# Patient Record
Sex: Male | Born: 1935
Health system: Southern US, Community
[De-identification: ages and names within clinical notes are randomized; demographics above are authoritative.]

## PROBLEM LIST (undated history)

## (undated) DIAGNOSIS — I639 Cerebral infarction, unspecified: Secondary | ICD-10-CM

## (undated) DIAGNOSIS — I714 Abdominal aortic aneurysm, without rupture, unspecified: Secondary | ICD-10-CM

## (undated) DIAGNOSIS — E119 Type 2 diabetes mellitus without complications: Secondary | ICD-10-CM

## (undated) DIAGNOSIS — K449 Diaphragmatic hernia without obstruction or gangrene: Secondary | ICD-10-CM

## (undated) DIAGNOSIS — E785 Hyperlipidemia, unspecified: Secondary | ICD-10-CM

## (undated) DIAGNOSIS — I728 Aneurysm of other specified arteries: Secondary | ICD-10-CM

## (undated) DIAGNOSIS — H9190 Unspecified hearing loss, unspecified ear: Secondary | ICD-10-CM

## (undated) DIAGNOSIS — I10 Essential (primary) hypertension: Secondary | ICD-10-CM

## (undated) DIAGNOSIS — I351 Nonrheumatic aortic (valve) insufficiency: Secondary | ICD-10-CM

## (undated) HISTORY — DX: Hyperlipidemia, unspecified: E78.5

## (undated) HISTORY — DX: Abdominal aortic aneurysm, without rupture: I71.4

## (undated) HISTORY — PX: EYE SURGERY: SHX253

## (undated) HISTORY — DX: Diaphragmatic hernia without obstruction or gangrene: K44.9

## (undated) HISTORY — PX: HERNIA REPAIR: SHX51

## (undated) HISTORY — PX: CHOLECYSTECTOMY: SHX55

## (undated) HISTORY — DX: Nonrheumatic aortic (valve) insufficiency: I35.1

## (undated) HISTORY — DX: Type 2 diabetes mellitus without complications: E11.9

## (undated) HISTORY — DX: Aneurysm of other specified arteries: I72.8

## (undated) HISTORY — DX: Abdominal aortic aneurysm, without rupture, unspecified: I71.40

---

## 2003-09-12 ENCOUNTER — Encounter: Admission: RE | Admit: 2003-09-12 | Discharge: 2003-09-12 | Payer: Self-pay | Admitting: Family Medicine

## 2003-10-11 ENCOUNTER — Encounter: Admission: RE | Admit: 2003-10-11 | Discharge: 2003-10-11 | Payer: Self-pay | Admitting: Vascular Surgery

## 2003-10-11 ENCOUNTER — Encounter: Admission: RE | Admit: 2003-10-11 | Discharge: 2004-01-09 | Payer: Self-pay | Admitting: Family Medicine

## 2003-10-30 HISTORY — PX: ABDOMINAL AORTIC ANEURYSM REPAIR: SUR1152

## 2003-10-31 ENCOUNTER — Inpatient Hospital Stay (HOSPITAL_COMMUNITY): Admission: RE | Admit: 2003-10-31 | Discharge: 2003-11-07 | Payer: Self-pay | Admitting: Vascular Surgery

## 2003-12-02 ENCOUNTER — Encounter: Admission: RE | Admit: 2003-12-02 | Discharge: 2003-12-02 | Payer: Self-pay | Admitting: Vascular Surgery

## 2004-01-06 ENCOUNTER — Encounter: Admission: RE | Admit: 2004-01-06 | Discharge: 2004-01-06 | Payer: Self-pay | Admitting: Vascular Surgery

## 2005-01-04 ENCOUNTER — Encounter: Admission: RE | Admit: 2005-01-04 | Discharge: 2005-01-04 | Payer: Self-pay | Admitting: Vascular Surgery

## 2005-04-09 ENCOUNTER — Encounter (INDEPENDENT_AMBULATORY_CARE_PROVIDER_SITE_OTHER): Payer: Self-pay | Admitting: *Deleted

## 2005-04-09 ENCOUNTER — Ambulatory Visit (HOSPITAL_COMMUNITY): Admission: RE | Admit: 2005-04-09 | Discharge: 2005-04-10 | Payer: Self-pay | Admitting: General Surgery

## 2006-01-10 ENCOUNTER — Encounter: Admission: RE | Admit: 2006-01-10 | Discharge: 2006-01-10 | Payer: Self-pay | Admitting: Vascular Surgery

## 2007-01-09 ENCOUNTER — Encounter: Admission: RE | Admit: 2007-01-09 | Discharge: 2007-01-09 | Payer: Self-pay | Admitting: Vascular Surgery

## 2007-01-09 ENCOUNTER — Ambulatory Visit: Payer: Self-pay | Admitting: Vascular Surgery

## 2008-01-06 ENCOUNTER — Ambulatory Visit: Payer: Self-pay | Admitting: Vascular Surgery

## 2009-01-11 ENCOUNTER — Ambulatory Visit: Payer: Self-pay | Admitting: Vascular Surgery

## 2009-01-11 ENCOUNTER — Encounter: Admission: RE | Admit: 2009-01-11 | Discharge: 2009-01-11 | Payer: Self-pay | Admitting: Vascular Surgery

## 2009-01-12 ENCOUNTER — Ambulatory Visit: Payer: Self-pay | Admitting: Oncology

## 2009-02-03 LAB — CBC WITH DIFFERENTIAL/PLATELET
BASO%: 0.3 % (ref 0.0–2.0)
Basophils Absolute: 0 10*3/uL (ref 0.0–0.1)
EOS%: 2.4 % (ref 0.0–7.0)
Eosinophils Absolute: 0.1 10*3/uL (ref 0.0–0.5)
HCT: 44 % (ref 38.4–49.9)
HGB: 15.2 g/dL (ref 13.0–17.1)
LYMPH%: 18 % (ref 14.0–49.0)
MCH: 33.2 pg (ref 27.2–33.4)
MCHC: 34.6 g/dL (ref 32.0–36.0)
MCV: 96.1 fL (ref 79.3–98.0)
MONO#: 0.5 10*3/uL (ref 0.1–0.9)
MONO%: 10.3 % (ref 0.0–14.0)
NEUT#: 3.7 10*3/uL (ref 1.5–6.5)
NEUT%: 69 % (ref 39.0–75.0)
Platelets: 134 10*3/uL — ABNORMAL LOW (ref 140–400)
RBC: 4.58 10*6/uL (ref 4.20–5.82)
RDW: 13.8 % (ref 11.0–14.6)
WBC: 5.3 10*3/uL (ref 4.0–10.3)
lymph#: 1 10*3/uL (ref 0.9–3.3)

## 2009-02-03 LAB — COMPREHENSIVE METABOLIC PANEL
ALT: 20 U/L (ref 0–53)
AST: 21 U/L (ref 0–37)
Albumin: 4 g/dL (ref 3.5–5.2)
Alkaline Phosphatase: 90 U/L (ref 39–117)
BUN: 22 mg/dL (ref 6–23)
CO2: 24 mEq/L (ref 19–32)
Calcium: 8.7 mg/dL (ref 8.4–10.5)
Chloride: 107 mEq/L (ref 96–112)
Creatinine, Ser: 0.93 mg/dL (ref 0.40–1.50)
Glucose, Bld: 190 mg/dL — ABNORMAL HIGH (ref 70–99)
Potassium: 4.5 mEq/L (ref 3.5–5.3)
Sodium: 139 mEq/L (ref 135–145)
Total Bilirubin: 0.6 mg/dL (ref 0.3–1.2)
Total Protein: 6.3 g/dL (ref 6.0–8.3)

## 2009-02-03 LAB — LACTATE DEHYDROGENASE: LDH: 206 U/L (ref 94–250)

## 2009-02-10 ENCOUNTER — Ambulatory Visit (HOSPITAL_COMMUNITY): Admission: RE | Admit: 2009-02-10 | Discharge: 2009-02-10 | Payer: Self-pay | Admitting: Oncology

## 2009-02-15 ENCOUNTER — Ambulatory Visit: Payer: Self-pay | Admitting: Oncology

## 2010-01-26 ENCOUNTER — Ambulatory Visit: Payer: Self-pay | Admitting: Oncology

## 2010-01-30 ENCOUNTER — Ambulatory Visit (HOSPITAL_COMMUNITY): Admission: RE | Admit: 2010-01-30 | Discharge: 2010-01-30 | Payer: Self-pay | Admitting: Oncology

## 2010-07-21 ENCOUNTER — Other Ambulatory Visit: Payer: Self-pay | Admitting: Oncology

## 2010-07-21 DIAGNOSIS — C8590 Non-Hodgkin lymphoma, unspecified, unspecified site: Secondary | ICD-10-CM

## 2010-07-22 ENCOUNTER — Encounter: Payer: Self-pay | Admitting: Oncology

## 2010-07-22 ENCOUNTER — Encounter: Payer: Self-pay | Admitting: Vascular Surgery

## 2010-10-07 LAB — GLUCOSE, CAPILLARY: Glucose-Capillary: 116 mg/dL — ABNORMAL HIGH (ref 70–99)

## 2010-11-13 NOTE — Assessment & Plan Note (Signed)
OFFICE VISIT   [name], [name]  DOB:  11-Sep-1932                                       01/06/2008  [id number]   The patient returns for followup today.  He had an abdominal aortic  aneurysm and right common iliac artery aneurysm repaired in May of 2005.  He has done well since then.  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   He has had no significant changes in his medical problems in the last  year.  He has had no abdominal or back pain.  He has had some pain in  his left anterior tibia and left hip on occasion.  He states that  usually this occurs at night time as far as the tibia is concerned and  it is improved by walking around some.  The left hip pain is  intermittent to only when he is on his feet.  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  Of note, the celiac  aneurysm was 1.5 cm in diameter in 2008.   PHYSICAL EXAMINATION:  Vital signs:  On exam today the blood pressure is  116/70 in the left arm, pulse is 70 and regular.  Neck:  Has no carotid  bruit.  Chest:  Clear to auscultation.  Cardiac:  Regular rate and  rhythm.  Extremities:  He has 2+ radial, femoral, popliteal, dorsalis  pedis and posterior tibial pulses bilaterally.  The left popliteal pulse  is slightly fuller than the right.  Of note, he did have a popliteal  ultrasound in 2005 which showed no aneurysm.  Abdomen:  Exam shows no  evidence of hernia, soft, nontender, nondistended with no mass.   MEDICATIONS:  1. Currently include Vytorin.  2. Nexium.  3. Aspirin.  4. Actos.  5. Claritin.   Overall the patient continues to do well.  His aneurysm repair seems  intact and he has adequate lower extremity arterial circulation which is  normal by pulse exam.  We will obtain a followup CT angio of the abdomen  and pelvis in 1 year.  If the celiac aneurysm is stable in size we may  switch that to every few years  subsequently.   [name] Oto. Fields, MD  Electronically Signed   CEF/MEDQ  D:  01/06/2008  T:  01/07/2008  Job:  [id number]   cc:   [name] [name]. [name] [name], M.D.

## 2010-11-13 NOTE — Assessment & Plan Note (Signed)
OFFICE VISIT   WRIGHT, MOLLE  DOB:  April 10, 1933                                       01/11/2009  [id number]   The patient is a 77 year old male who returns for followup today.  He  previously had repair of an infrarenal abdominal aortic aneurysm and  iliac aneurysm in May 2005.  He has done well since then.  He has had a  small celiac artery aneurysm that we have been following for him.  He  continues to deny any abdominal or back pain.  He has  Some occasional left hip pain.  He has had no recent weight loss or  gain.  Overall he is doing well.  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  He is also continuing  his hobby of restoring old cars.   CURRENT MEDICATIONS:  1. Crestor 10 mg once a day.  2. Actos 30 mg once a day.  3. Lexapro 5 mg once a day.  4. Nexium 40 mg once a day.  5. Nasonex p.r.n.  6. Aspirin 81 mg once a day.  7. Multivitamin once a day.  8. Fish oil.  9. Claritin p.r.n.   ALLERGIES:  He is allergic to shellfish and iodine but has not had  difficulties with contrast-enhanced CT scans.   PHYSICAL EXAMINATION:  Today, blood pressure 147/75 in the right arm,  pulse 75 and regular.  HEENT:  Unremarkable.  Neck:  Has 2+ carotid  pulses without bruit.  Chest:  Clear to auscultation.  Cardiac:  Regular  rate and rhythm without murmur.  Abdomen:  Soft, nontender,  nondistended.  No masses, with a well-healed midline laparotomy scar.  He has 2+ femoral and dorsalis pedis pulses bilaterally.  He has no  significant pretibial edema.   I reviewed his CT scan of the abdomen and pelvis from today which shows  the celiac artery aneurysm near the origin which is 1.6 cm in diameter  and is essentially unchanged.  The celiac artery aneurysm has now been  stable in size for 5 years.  Of note, in 2005 at the time of his initial  CT scan he was noted to have several pleural nodules.  These  were  followed initially with serial CT scans with no change.  The subpleural  nodule on the left side has remained stable on all subsequent CT scans.  However, he was also noted in 2005 to have prominent mesenteric lymph  nodes.  These have slowly progressively enlarged over the last few  years.  On today's CT scan, the celiac artery aneurysm again remains  stable in size at 1.6 cm.  However, the size and number of mesenteric  lymph nodes have increased.  There is also associated calcification.   In summary, as far as the patient's celiac aneurysm is concerned, this  has been stable now for 5 years and I believe that we can probably space  his CT scans out.  I have advised him that we should get another CT scan  in about 5 years.  As far as the mesenteric lymph nodes are concerned,  they have been slowly growing over time.  I believe the best option  would be to get a hematology opinion on whether or not I think these  lymph  nodes are clinically significant.  The patient denies any  abdominal pain and does not have any constitutional-type symptoms to  suggest lymphoma or carcinoid.  However, I will defer whether he had any  further workup might be necessary to the hematologist.  We will schedule  this appointment for him in the near future.  Otherwise he will  recontact [place] in a few years to check his celiac artery again.   Jessy Oto. Fields, MD  Electronically Signed   CEF/MEDQ  D:  01/11/2009  T:  01/12/2009  Job:  [id number]   cc:   Modena Jansky. Marisue Humble, M.D.

## 2010-11-13 NOTE — Assessment & Plan Note (Signed)
OFFICE VISIT   [name] [name], [name] [name]  DOB:  [birth year]                                       01/09/2007  [id number]   The patient returns for followup today after repair of his abdominal  aortic aneurysm in May 2005.  He has had no problems since then.  We  were also following him for a small celiac artery aneurysm.  He denies  any abdominal or back pain.  States he is doing well overall and has no  complaints.   PHYSICAL EXAMINATION:  Blood pressure is 120/67, pulse is 74 and  regular.  Abdomen is soft, nontender with no pulsatile masses.  He has  2+ femoral pulses bilaterally.  He has 2+ dorsalis pedis and posterior  tibial pulses bilaterally.   His abdominal incision is well-healed with no hernia.  He had a CT angio  of the abdomen and pelvis today.  This shows that the celiac artery  aneurysm is still 74mm in diameter and unchanged over the last few  years.  He did have some mild narrowing of the right iliac anastomosis  which does not appear to be flow limiting at least based on clinical  exam.  He denies any claudication symptoms.   Overall I believe the patient is doing well.  We will see him back in  followup in 1 year's time to make sure that he has had no symptoms from  the iliac narrowing.  We will repeat his CT scan of the abdomen and  pelvis in 2 year's time.   Jessy Oto. Fields, MD  Electronically Signed   CEF/MEDQ  D:  01/09/2007  T:  01/12/2007  Job:  [id number]   cc:   Modena Jansky. Marisue Humble, M.D.

## 2010-11-16 NOTE — Op Note (Signed)
NAME:  [name] [name], [name] [name] NO.:  [id number]   MEDICAL RECORD NO.:  [id number]                   PATIENT TYPE:  INP   LOCATION:  2874                                 FACILITY:  [facility]   PHYSICIAN:  Jessy Oto. Fields, MD               DATE OF BIRTH:  09/28/1932   DATE OF PROCEDURE:  10/31/2003  DATE OF DISCHARGE:                                 OPERATIVE REPORT   PREOPERATIVE DIAGNOSIS:  Infrarenal abdominal aortic and right common iliac  artery aneurysm.   POSTOPERATIVE DIAGNOSIS:  Infrarenal abdominal aortic and right common iliac  artery aneurysm.   PROCEDURE:  Aortobi-iliac grafting with repair of abdominal aortic aneurysm  and right common iliac artery aneurysm.   ANESTHESIA:  General.   ASSISTANTS:  Judeth Cornfield. Scot Dock, M.D., Suzzanne Cloud, P.A.   INDICATIONS:  The patient is a 77 year old male with history of a 3.7 cm  right common iliac artery aneurysm.  He also has a 3 cm infrarenal abdominal  aortic aneurysm.  He also has strong family history of abdominal aortic  aneurysm.   FINDINGS:  1. A 3.7 cm right common iliac artery aneurysm.  2. A 16 x 8 bifurcated Dacron graft.  3. Aortobi-iliac anastomoses to the iliac bifurcation bilaterally.   OPERATIVE DETAILS:  After obtaining informed consent, the patient was taken  to the operating room.  The patient was placed in the supine position on the  operating table.  After induction of general anesthesia and endotracheal  intubation, a nasogastric tube and a Foley catheter were placed.  Next the  patient's abdomen was prepped and draped in the usual sterile fashion from  the nipples down to the knees.  A midline laparotomy incision was made in  the midline, carried down through the subcutaneous tissues through the  fascia, and the peritoneum was entered.  The incision extended from the  xiphoid down to the level of the pubis.  Next the abdomen was explored.  The  gallbladder was shrunken but  normal in appearance.  The liver, colon, small  bowel, and liver were all grossly normal in appearance.   Next the transverse colon and omentum were reflected superiorly and the  small bowel reflected laterally.  Retroperitoneum was then opened  along the  course of the aorta and along the aortic bifurcation.  A 3.7 cm right common  iliac artery aneurysm was identified.  The dissection was carried down on  the right side down to the level of the iliac bifurcation.  The internal  iliac and external iliac arteries were then controlled circumferentially  with vessel loops.  Next the dissection proceeded superiorly up to the level  of the renal vein.  The renal vein was dissected on its posterior aspect and  the aorta dissected free circumferentially and controlled with an umbilical  tape.  Next the dissection proceeded down to the level of the inferior  mesenteric artery.  This was dissected  free circumferentially and controlled  with a vessel loop.  Dissection then proceeded down on the left iliac  system, and this dissection was also carried down to the left external and  internal iliac arteries, which were dissected free circumferentially and  controlled with vessel loops.  Next the patient was given 5000 units of  intravenous heparin and 12.5 g of mannitol.  The aorta was then clamped  proximally.  Initially the clamp was abutting across a portion of the right  renal artery, and this was moved down slightly so that the clamp was just  below the level of the renal arteries on both sides.  Next the aorta was  opened in the midline and several pairs of lumbar arteries were controlled  with 2-0 silk sutures.  Two lumbars near the superiormost portion of the  dissection were controlled with clips posteriorly.  Next a 16 x 8 mm Dacron  graft was brought up in the operative field.  A portion of the top end was  cut off and then a proximal anastomosis created using a running 3-0 Prolene,   incorporating a felt circumferentially in the proximal anastomosis.  The  anastomosis was secured proximally and the clamp was released.  The  anastomosis was tested and found to be hemostatic.  Next the graft was  pulled down to length and spatulated on the right iliac side.  The artery  wall was of good quality and debrided down through the iliac aneurysm down  to the level of the iliac bifurcation.  This was then sewn as an end-to-end  anastomosis using a running 5-0 Prolene suture.  There was a small amount of  bleeding on the posterior wall prior to releasing the clamps, and this was  controlled with one 3-0 Prolene stitch.  Prior to completion of the  anastomosis, the external iliac and internal iliac arteries were backbled  and the graft forebled.  The anastomosis was then secured and flow was  restored to the right leg.  There was a mild drop in pressure upon  restoration of flow to the right leg.  There was a palpable femoral pulse  upon completion of this anastomosis.  Next attention turned to the left  side.  Since the patient was fairly young in age, the graft was tunneled  underneath the nerve plexus crossing the aortic bifurcation.  This was then  pulled down to the level of the iliac bifurcation on the left side.  The  iliac artery was transected in this location and an end-to-end anastomosis  created using a running 5-0 Prolene suture.  Just prior to completion of the  anastomosis, the external and internal iliac arteries were backbled, the  graft was thoroughly flushed forward.  The anastomosis was then secured and  flow was restored to the left leg.  There was also a mild pressure drop on  his leg as well.  There was a palpable femoral pulse after this anastomosis  was completed.  Next the inferior mesenteric artery was inspected and found  to have brisk backbleeding.  This was ligated with a silk suture.  Next the retroperitoneum was inspected.  Hemostasis was obtained.   Protamine 50 mg  was given.  The retroperitoneum was then closed with a running 3-0 Prolene  suture.  There was minimal aortic tissue to cover the graft, so large bites  of retroperitoneal fat were used to cover this.  The right common iliac  artery aneurysm was closed over the graft.  The abdomen was then closed with  a #1 PDS in the fascial layer, the wound thoroughly irrigated with normal  saline solution, and then the skin closed with staples.  The patient  tolerated the procedure well, and there were no complications.  Instrument,  sponge, and needle count was correct at the end of the case.  The patient  had palpable dorsalis pedis pulses bilaterally at the end of the case.  The  sigmoid colon was inspected at the end of the case, found to be pink in  character, as well as the small bowel.  The patient was extubated in the  operating room and taken to the recovery room in stable condition.                                               Jessy Oto. Fields, MD    CEF/MEDQ  D:  10/31/2003  T:  10/31/2003  Job:  [id number]

## 2010-11-16 NOTE — H&P (Signed)
NAME:  [name] [name], [name] [name]                    ACCOUNT NO.:  [id number]   MEDICAL RECORD NO.:  [id number]                   PATIENT TYPE:  INP   LOCATION:                                       FACILITY:  [facility]   PHYSICIAN:  Jessy Oto. Fields, MD               DATE OF BIRTH:  03/02/1933   DATE OF ADMISSION:  10/31/2003  DATE OF DISCHARGE:                                HISTORY & PHYSICAL   CHIEF COMPLAINT:  Right iliac aneurysm.   HISTORY OF PRESENT ILLNESS:  [name] is a 77 year old male who was  referred by Dr. Marisue Humble for evaluation of a right iliac aneurysm.  The  patient has a brother who had an abdominal aortic aneurysm repair, and he  felt that he should undergo screening for this, since it may be hereditary.  He then underwent a screening abdominal ultrasound, which did indeed show a  small abdominal aortic aneurysm, and a rather large right iliac aneurysm.  This study was followed up with a CT scan which showed a 37 mm right common  iliac aneurysm.  The aortic diameter was 3 cm.  The patient was then seen in  consultation by Dr. Oneida Alar on October 14, 2003.  Dr. Oneida Alar discussed surgical  repair of this aneurysm.  He suggested an aortobiiliac bypass grafting in  order to treat the small aortic aneurysm, as well as the iliac artery  aneurysm.  The risks, benefits, and alternatives to the procedure were  discussed with the patient and his wife at the time, and they were in  understanding and wished to proceed with surgery.   Currently, the patient is completely asymptomatic from his aortic aneurysm  disease.  He denies any abdominal pain, nausea, vomiting, constipation,  hematochezia, back pain, hematemesis, claudication symptoms, peripheral  edema, dysuria, hematuria, or GERD-like symptoms.  He denies any shortness  of breath or dyspnea on exertion.  The patient denies any fevers, chills,  night sweats, or cough.   PAST MEDICAL HISTORY:  1. Diabetes mellitus type 2  diabetes, which is diet controlled, and     diagnosed approximately a year ago.  2. Hypercholesterolemia.  3. Hiatal hernia.   PAST SURGICAL HISTORY:  Inguinal hernia repair x2.   MEDICATIONS:  1. Lipitor 5 mg p.o. daily.  2. Nexium 40 mg p.o. daily.  3. Nasonex 50 mcg to each nostril twice daily as needed.  4. Zyrtec 10 mg p.o. daily.  5. Aspirin 81 mg p.o. daily.  6. Vitamin E 400 international units p.o. daily.  7. Multivitamin daily.   ALLERGIES:  The patient has a severe allergy to shellfish.  He has seasonal  allergies and hay fever.   REVIEW OF SYSTEMS:  Please see HPI for pertinent positives and negatives.  Otherwise, the patient denies any kidney disease, TIA or CVA, amaurosis  fugax, cardiac disease, or pulmonary problems.  He denies any each bruising  or bleeding.  The  patient does wear glasses.  The remaining 12-point review  of systems is negative.   FAMILY HISTORY:  The patient's mother had severe rheumatoid arthritis and  passed away in her 38's.  His father died at age 77 of unknown etiology,  although he was reportedly a heavy drinker.  He has 4 brothers who all have  hypertension and diabetes mellitus.  He has 1 brother who has undergone  abdominal aortic aneurysm repair and grafting.   SOCIAL HISTORY:  Mr. [name] [name] has been married for 57 years, and has 2  children.  He is currently retired from Furniture conservator/restorer work.  He denies any  alcohol or tobacco use.   PHYSICAL EXAMINATION:  VITAL SIGNS:  His blood pressure on the right is  118/64.  His blood pressure on the left is 118/70.  Pulse is 74 and regular.  Respirations are 14 and unlabored.  GENERAL:  This is a pleasant 77 year old white male who in no acute distress  and is alert and oriented x3.  HEENT:  Normocephalic and atraumatic.  Pupils equal, round and reactive to  light and accommodation.  Extraocular movements intact.  The patient is  wearing glasses, and has all of his native teeth.  His oropharynx  is pink  and moist without exudate or lesion.  NECK:  Supple without lymphadenopathy, JVD, or bruit.  LUNGS:  Clear to auscultation throughout without wheezes, rhonchi, rales, or  lymphadenopathy.  CARDIAC:  Regular rate and rhythm without murmur, gallop, or rub.  ABDOMEN:  Soft, nontender, nondistended, with bowel sounds in all 4  quadrants.  No organomegaly or masses or bruit.  GU/RECTAL:  Deferred.  EXTREMITIES:  Warm and dry without edema, clubbing, or cyanosis.  Peripheral  pulses are as follows:  2+ carotid pulses bilaterally, 2+ femoral pulses  bilaterally, 2+ dorsalis pedis pulses bilaterally, 2+ posterior tibial  pulses bilaterally.  NEUROLOGIC:  Nonfocal.  Sensation is intact.  Gait is steady.  DTR's are 2+  and equal.  Muscle strength is equal and appropriate throughout.   ASSESSMENT AND PLAN:  Mr. [name] is a 77 year old male with  asymptomatic right iliac aneurysm and small abdominal aortic aneurysm.  We  will continue as planned with aortobiiliac bypass grafting on Oct 31, 2003,  to be completed by Dr. Oneida Alar of [facility].  Dr. Oneida Alar has been and evaluated  this patient prior to this admission, and has explained the risks and  benefits of the procedure.  The patient is in understands and agrees to  proceed with surgery.      Carolyn A. Zigmund Gottron.                  Jessy Oto. Fields, MD    CAF/MEDQ  D:  10/27/2003  T:  10/27/2003  Job:  [id number]   cc:   Modena Jansky. Marisue Humble, M.D.  [address] [place]  [place] [address]  Fax: [phone]

## 2010-11-16 NOTE — Op Note (Signed)
[name], [name]          ACCOUNT NO.:  [id number]   MEDICAL RECORD NO.:  [id number]          PATIENT TYPE:  OIB   LOCATION:  5707                         FACILITY:  [facility]   PHYSICIAN:  Shellia Carwin, M.D. DATE OF BIRTH:  1932/12/01   DATE OF PROCEDURE:  04/09/2005  DATE OF DISCHARGE:                                 OPERATIVE REPORT   OPERATIVE PROCEDURE:  Laparoscopic cholecystectomy with intraoperative  cholangiogram.   SURGEON:  Shellia Carwin, M.D.   ASSISTANT:  Fenton Malling. Lucia Gaskins, M.D.   ANESTHESIA:  General endotracheal.   PREOPERATIVE DIAGNOSES:  Gallstone.   POSTOP DIAGNOSIS:  Gallstone; plus normal cholangiogram.   CLINICAL SUMMARY:  A 77 year old male with gallstones. They were noted about  2 years ago, but not really bothering him.  Recently they have been giving  him some problems with indigestion and pain, and he comes in for elective  surgery. Of note is the fact that approximately 1 year ago he had AAA  surgery.   OPERATIVE FINDINGS:  We were fortunate in that he did not have many  adhesions at all, and we could perform the procedure laparoscopically.  The  gallbladder was thin-walled and the cholangiogram looked normal. The artery  and duct looked normal.   OPERATIVE PROCEDURE:  Under satisfactory general endotracheal anesthesia,  having received 1.0 grams Ancef preoperatively, the patient's abdomen was  prepped and draped in the standard fashion. Approximately 25 cc of 0.5%  Marcaine with epinephrine was infiltrated at the skin incision sites for  postoperative analgesia. A transverse incision made above the umbilicus and  the midline opened into the peritoneal cavity. It was done very carefully in  case bowel was stuck under, and there was none. Under good vision, a figure-  of-eight 0 Vicryl suture was placed, and the [place] port inserted and  secured. Good CO2 pneumoperitoneum was established, and under direct vision,  two #5 ports placed  laterally and another #10 port medially. Lateral  graspers gave excellent exposure, and operating through the medial port, we  carefully dissected the cystic duct gallbladder junction and cleared a  length of the artery sufficient for catheter placement. A single clip placed  on the duct near the gallbladder. The cystic artery was likewise  circumferentially dissected and controlled with clips. It was noted that  this was just the anterior branch. Following this a percutaneous catheter  was placed into an opening in the cystic duct, and a good cholangiogram  obtained. The clip was then removed from the duct and the catheter  withdrawn. The duct was then controlled with multiple clips distally and  divided. The anterior branch of the artery was divided. Then the gallbladder  was dissected from below upward, encountering the posterior branch of the  artery; which was also controlled between clips. The dissection proceeded  upward and in so doing a small hole was made in the gallbladder, with  spillage of clear bile but no stone. The dissection was completed and then  the gallbladder removed from the liver bed. The bed was checked for  hemostasis, which was good.   An EndoCatch bag  was placed through the upper port and the gallbladder  securely positioned in it. The operative site was checked for hemostasis,  lavaged with saline until the returns were clear. Then the camera moved from  the umbilical port to the upper port, and through the umbilical port a large  grasper was used to extract the gallbladder in the bag without spillage or  further problem. Then, the operative site again checked. The abdomen was  suctioned dry and ports released, and CO2 let out. The midline was closed  with a previous figure-of-eight and a second interrupted 0 Vicryl. The upper  medial incision also closed with a 0 Vicryl fascial suture. Then 4-0 Vicryl  approximated subcutaneous and Steri-Strips for all skin  incisions. Sterile  absorbent dressings were applied.  The patient went to the recovery room  from the operating room in good condition.           ______________________________  Shellia Carwin, M.D.     MRL/MEDQ  D:  04/09/2005  T:  04/09/2005  Job:  [id number]   cc:   Jessy Oto. [address], [place], [place] [address]   Modena Jansky. Marisue Humble, M.D.  Fax: [phone]

## 2010-11-16 NOTE — Discharge Summary (Signed)
NAME:  [name] [name], [name] [name] NO.:  [id number]   MEDICAL RECORD NO.:  [id number]                   PATIENT TYPE:  INP   LOCATION:  2008                                 FACILITY:  [facility]   PHYSICIAN:  Jessy Oto. Fields, MD               DATE OF BIRTH:  May 06, 1933   DATE OF ADMISSION:  10/31/2003  DATE OF DISCHARGE:  11/07/2003                                 DISCHARGE SUMMARY   HISTORY OF PRESENT ILLNESS:  [name] [name] is a 77 year old male referred  by Dr. Marisue Humble for evaluation of a right iliac artery aneurysm.  The patient  has a brother who had an abdominal aortic aneurysm repair and he was felt to  require screening since there may be a hereditary relationship.  Upon this  screening, he was found on abdominal ultrasound to have a small abdominal  aortic aneurysm approximately 3 cm but a rather large right iliac aneurysm  measuring 3.7 cm in the right common iliac artery.  The patient was  subsequently referred to Dr. Oneida Alar for consultation and Dr. Oneida Alar  recommended to proceed with surgical revascularization.  The patient was  admitted this hospitalization for the procedure.  He was noted to be  essentially asymptomatic regarding this.   PAST MEDICAL HISTORY:  1. Diabetes mellitus type 2 which is diet controlled, diagnosed     approximately a year ago.  2. Hypercholesterolemia.  3. History of hiatal hernia.   PAST SURGICAL HISTORY:  Inguinal hernia repair x2.   MEDICATIONS ON ADMISSION:  1. Lipitor 5 mg daily.  2. Nexium 40 mg daily.  3. Nasonex 50 mcg to each nostril twice daily as needed.  4. Zyrtec 10 mg daily.  5. Aspirin 81 mg daily.  6. Vitamin E 400 international units daily.  7. Multivitamin daily.   ALLERGIES:  He has a severe allergy to SHELL FISH as well as seasonal  allergies and hay fever but no known drug allergies.   For family history, social history, review of systems, and physical  examination please see the history and  physical done at the time of  admission.   HOSPITAL COURSE:  The patient was admitted electively, taken to the  operating room by Dr. Oneida Alar at which time he underwent the following  procedure.  Aortobi iliac grafting with repair of abdominal aortic aneurysm  and right common iliac artery aneurysm.  The patient tolerated the procedure  well and was taken to the surgical intensive care unit in stable condition.   POSTOPERATIVE HOSPITAL COURSE:  The patient has done quite well.  He has  maintained stable hemodynamics.  He has progressed slow but well in regard  to a routine advancement diet with resumption of abdominal function.  His  laboratory values have been mostly stable.  BUN and creatinine are within  normal limits.  He did develop a mild hyponatremia which responded to fluid  therapy.  He does have a mild  anemia with most recent hemoglobin and  hematocrit on Nov 04, 2003, stable at 12.0 and 34.8, respectively.  His  incisions are healing well without signs of infection.  He has palpable  dorsalis pedis pulses bilaterally with well perfused extremities.  Oxygen  has been weaned and he maintained good saturations on room air.  He is  afebrile.  He is tentatively felt to be stable for discharge in the morning  of Nov 07, 2003, pending morning round reevaluation.   DISCHARGE MEDICATION:  As preoperatively for pain, Tylox one or two every  for to six  hours as needed.   DISCHARGE INSTRUCTIONS:  The patient received written instructions in regard  to medications, __________ diet, wound care and follow-up.   FOLLOW UP:  Staple removal at the [facility] office.  Also, an appointment with  Dr. Oneida Alar in two to three weeks.   FINAL DIAGNOSES:  1. Right common iliac artery and abdominal aortic aneurysms now status post     resection and grafting with aortobi iliac grafting.  2. Other diagnoses as previously listed including:     a. Type 2 diabetes mellitus, diet controlled.     b.  Hypercholesterolemia.     c. History of hiatal hernia.     d. History of inguinal hernia repair.      [name], P.A.-C.                    Jessy Oto. Fields, MD    WEG/MEDQ  D:  11/06/2003  T:  11/07/2003  Job:  [id number]   cc:   Modena Jansky. Marisue Humble, M.D.  [place] [place]  [place] [address]  Fax: [phone]

## 2011-02-04 ENCOUNTER — Other Ambulatory Visit (HOSPITAL_COMMUNITY): Payer: Self-pay

## 2011-02-04 ENCOUNTER — Other Ambulatory Visit: Payer: Self-pay | Admitting: Oncology

## 2011-02-04 ENCOUNTER — Ambulatory Visit (HOSPITAL_COMMUNITY)
Admission: RE | Admit: 2011-02-04 | Discharge: 2011-02-04 | Disposition: A | Discharge status: Home / Self Care | Payer: Medicare Other | Source: Ambulatory Visit | Attending: Oncology | Admitting: Oncology

## 2011-02-04 ENCOUNTER — Encounter (HOSPITAL_BASED_OUTPATIENT_CLINIC_OR_DEPARTMENT_OTHER): Payer: Medicare Other | Admitting: Oncology

## 2011-02-04 DIAGNOSIS — I728 Aneurysm of other specified arteries: Secondary | ICD-10-CM | POA: Insufficient documentation

## 2011-02-04 DIAGNOSIS — C8589 Other specified types of non-Hodgkin lymphoma, extranodal and solid organ sites: Secondary | ICD-10-CM | POA: Insufficient documentation

## 2011-02-04 DIAGNOSIS — R599 Enlarged lymph nodes, unspecified: Secondary | ICD-10-CM

## 2011-02-04 DIAGNOSIS — K5730 Diverticulosis of large intestine without perforation or abscess without bleeding: Secondary | ICD-10-CM | POA: Insufficient documentation

## 2011-02-04 DIAGNOSIS — C8590 Non-Hodgkin lymphoma, unspecified, unspecified site: Secondary | ICD-10-CM

## 2011-02-04 DIAGNOSIS — J984 Other disorders of lung: Secondary | ICD-10-CM | POA: Insufficient documentation

## 2011-02-04 LAB — CMP (CANCER CENTER ONLY)
ALT(SGPT): 23 U/L (ref 10–47)
AST: 26 U/L (ref 11–38)
Albumin: 3.4 g/dL (ref 3.3–5.5)
Alkaline Phosphatase: 106 U/L — ABNORMAL HIGH (ref 26–84)
BUN, Bld: 16 mg/dL (ref 7–22)
CO2: 29 mEq/L (ref 18–33)
Calcium: 9 mg/dL (ref 8.0–10.3)
Chloride: 97 mEq/L — ABNORMAL LOW (ref 98–108)
Creat: 0.8 mg/dl (ref 0.6–1.2)
Glucose, Bld: 175 mg/dL — ABNORMAL HIGH (ref 73–118)
Potassium: 4.6 mEq/L (ref 3.3–4.7)
Sodium: 140 mEq/L (ref 128–145)
Total Bilirubin: 0.6 mg/dl (ref 0.20–1.60)
Total Protein: 6.9 g/dL (ref 6.4–8.1)

## 2011-02-04 LAB — CBC WITH DIFFERENTIAL/PLATELET
BASO%: 0.4 % (ref 0.0–2.0)
Basophils Absolute: 0 10*3/uL (ref 0.0–0.1)
EOS%: 2 % (ref 0.0–7.0)
Eosinophils Absolute: 0.1 10*3/uL (ref 0.0–0.5)
HCT: 44.7 % (ref 38.4–49.9)
HGB: 15.6 g/dL (ref 13.0–17.1)
LYMPH%: 20.1 % (ref 14.0–49.0)
MCH: 33.4 pg (ref 27.2–33.4)
MCHC: 34.8 g/dL (ref 32.0–36.0)
MCV: 95.9 fL (ref 79.3–98.0)
MONO#: 0.5 10*3/uL (ref 0.1–0.9)
MONO%: 8.6 % (ref 0.0–14.0)
NEUT#: 4 10*3/uL (ref 1.5–6.5)
NEUT%: 68.9 % (ref 39.0–75.0)
Platelets: 158 10*3/uL (ref 140–400)
RBC: 4.66 10*6/uL (ref 4.20–5.82)
RDW: 13.4 % (ref 11.0–14.6)
WBC: 5.8 10*3/uL (ref 4.0–10.3)
lymph#: 1.2 10*3/uL (ref 0.9–3.3)

## 2011-02-04 MED ORDER — IOHEXOL 300 MG/ML  SOLN
100.0000 mL | Freq: Once | INTRAMUSCULAR | Status: AC | PRN
  Administered 2011-02-04: 100 mL via INTRAVENOUS

## 2011-02-06 ENCOUNTER — Encounter (HOSPITAL_BASED_OUTPATIENT_CLINIC_OR_DEPARTMENT_OTHER): Payer: Medicare Other | Admitting: Oncology

## 2011-02-06 DIAGNOSIS — R599 Enlarged lymph nodes, unspecified: Secondary | ICD-10-CM

## 2011-02-06 DIAGNOSIS — K639 Disease of intestine, unspecified: Secondary | ICD-10-CM

## 2013-04-14 ENCOUNTER — Other Ambulatory Visit: Payer: Self-pay

## 2013-04-14 DIAGNOSIS — Z48812 Encounter for surgical aftercare following surgery on the circulatory system: Secondary | ICD-10-CM

## 2013-04-14 DIAGNOSIS — I714 Abdominal aortic aneurysm, without rupture, unspecified: Secondary | ICD-10-CM

## 2013-04-14 DIAGNOSIS — I739 Peripheral vascular disease, unspecified: Secondary | ICD-10-CM | POA: Insufficient documentation

## 2013-04-14 DIAGNOSIS — I728 Aneurysm of other specified arteries: Secondary | ICD-10-CM

## 2013-04-14 DIAGNOSIS — IMO0002 Reserved for concepts with insufficient information to code with codable children: Secondary | ICD-10-CM

## 2013-04-14 NOTE — Addendum Note (Signed)
Addended by: Mena Goes on: 04/14/2013 10:44 AM   Modules accepted: Orders

## 2013-06-28 DIAGNOSIS — F419 Anxiety disorder, unspecified: Secondary | ICD-10-CM | POA: Insufficient documentation

## 2013-06-28 DIAGNOSIS — K219 Gastro-esophageal reflux disease without esophagitis: Secondary | ICD-10-CM | POA: Insufficient documentation

## 2013-12-08 ENCOUNTER — Encounter: Payer: Self-pay | Admitting: Vascular Surgery

## 2013-12-09 ENCOUNTER — Ambulatory Visit (INDEPENDENT_AMBULATORY_CARE_PROVIDER_SITE_OTHER): Payer: Medicare HMO | Admitting: Vascular Surgery

## 2013-12-09 ENCOUNTER — Encounter: Payer: Self-pay | Admitting: Vascular Surgery

## 2013-12-09 VITALS — BP 142/62 | HR 70 | Temp 98.1°F | Resp 16 | Ht 64.0 in | Wt 134.0 lb

## 2013-12-09 DIAGNOSIS — I714 Abdominal aortic aneurysm, without rupture, unspecified: Secondary | ICD-10-CM

## 2013-12-09 DIAGNOSIS — Z48812 Encounter for surgical aftercare following surgery on the circulatory system: Secondary | ICD-10-CM

## 2013-12-09 DIAGNOSIS — I728 Aneurysm of other specified arteries: Secondary | ICD-10-CM

## 2013-12-09 NOTE — Addendum Note (Signed)
Addended by: Mena Goes on: 12/09/2013 04:38 PM   Modules accepted: Orders

## 2013-12-09 NOTE — Progress Notes (Signed)
[facility] [place] HISTORY AND PHYSICAL   History of Present Illness:  Patient is a 80 y.o. 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 Patient was last seen in 2012. He previously underwent aorto to bilateral common iliac artery grafting for iliac artery aneurysm. This was done in 2005. He overall reports he is doing well. He does have some chronic abdominal pain that occurs intermittently with eating spicy food. He denies food fear. He denies weight loss. He denies postprandial abdominal pain other than spicy foods. Other medical problems include diabetes, hyperlipidemia, hiatal hernia all of which are currently stable.  Past Medical History  Diagnosis Date  . Diabetes mellitus without complication   . Hyperlipidemia   . Hiatal hernia   . Celiac artery aneurysm   . AAA (abdominal aortic aneurysm)     Past Surgical History  Procedure Laterality Date  . Abdominal aortic aneurysm repair  10-2003    also had iliac aneurysm repair  . Eye surgery Bilateral     cataracts  . Cholecystectomy      Laparoscopic cholecystectomy    Social History History  Substance Use Topics  . Smoking status: Never Smoker   . Smokeless tobacco: Not on file  . Alcohol Use: No    Family History Family History  Problem Relation Age of Onset  . Arthritis Mother     Rheumatoid arthritis    Allergies  Allergies  Allergen Reactions  . Iodine   . Shellfish Allergy      Current Outpatient Prescriptions  Medication Sig Dispense Refill  . aspirin 81 MG tablet Take 81 mg by mouth daily.      Marland Kitchen loratadine (CLARITIN) 10 MG tablet Take 10 mg by mouth daily.      . mometasone (NASONEX) 50 MCG/ACT nasal spray Place 2 sprays into the nose daily.      Marland Kitchen omeprazole (PRILOSEC) 20 MG capsule Take 20 mg by mouth 2 (two) times daily before a meal.      . rosuvastatin (CRESTOR) 10 MG tablet Take 10 mg by mouth daily.      . saxagliptin HCl (ONGLYZA) 5 MG TABS tablet  Take 5 mg by mouth daily.      Marland Kitchen levothyroxine (SYNTHROID, LEVOTHROID) 50 MCG tablet       . metFORMIN (GLUCOPHAGE) 1000 MG tablet Take 1,000 mg by mouth 2 (two) times daily.       No current facility-administered medications for this visit.    ROS:   General:  No weight loss, Fever, chills  HEENT: No recent headaches, no nasal bleeding, no visual changes, no sore throat  Neurologic: No dizziness, blackouts, seizures. No recent symptoms of stroke or mini- stroke. No recent episodes of slurred speech, or temporary blindness.  Cardiac: No recent episodes of chest pain/pressure, no shortness of breath at rest.  No shortness of breath with exertion.  Denies history of atrial fibrillation or irregular heartbeat  Vascular: No history of rest pain in feet.  No history of claudication.  No history of non-healing ulcer, No history of DVT   Pulmonary: No home oxygen, no productive cough, no hemoptysis,  No asthma or wheezing  Musculoskeletal:  [ ]  Arthritis, [ ]  Low back pain,  [ ]  Joint pain  Hematologic:No history of hypercoagulable state.  No history of easy bleeding.  No history of anemia  Gastrointestinal: No hematochezia or melena,  No gastroesophageal reflux, no trouble swallowing  Urinary: [ ]  chronic Kidney disease, [ ]   on HD - [ ]  MWF or [ ]  TTHS, [ ]  Burning with urination, [ ]  Frequent urination, [ ]  Difficulty urinating;   Skin: No rashes  Psychological: No history of anxiety,  No history of depression   Physical Examination  Filed Vitals:   12/09/13 1112  BP: 142/62  Pulse: 70  Temp: 98.1 F (36.7 C)  TempSrc: Oral  Resp: 16  Height: 5\' 4"  (1.626 m)  Weight: 134 lb (60.782 kg)  SpO2: 98%    Body mass index is 22.99 kg/(m^2).  General:  Alert and oriented, no acute distress HEENT: Normal Neck: No bruit or JVD Pulmonary: Clear to auscultation bilaterally Cardiac: Regular Rate and Rhythm without murmur Abdomen: Soft, non-tender, non-distended, no mass,  well-healed midline scar no hernia, no pulsatile mass no bruit Skin: No rash Extremity Pulses:  2+ radial, brachial, femoral, dorsalis pedis, posterior tibial pulses bilaterally Musculoskeletal: No deformity or edema  Neurologic: Upper and lower extremity motor 5/5 and symmetric    ASSESSMENT:  Overall patient doing well. He has followup scheduled soon with his GI doctor regarding his pain with spicy foods. He needs a CT scan of abdomen and pelvis to make sure that he has not had gross of the celiac artery aneurysm which was 1.2 cm in diameter in 2012. This has not been reimaged since then.  PLAN:  Patient will be scheduled for a CT angiogram and followup after his CT scan.  Ruta Hinds, MD [facility] [place] Office: [phone] Pager: [phone]

## 2014-01-19 ENCOUNTER — Encounter: Payer: Self-pay | Admitting: Vascular Surgery

## 2014-01-20 ENCOUNTER — Ambulatory Visit (INDEPENDENT_AMBULATORY_CARE_PROVIDER_SITE_OTHER): Payer: Medicare HMO | Admitting: Vascular Surgery

## 2014-01-20 ENCOUNTER — Encounter: Payer: Self-pay | Admitting: Vascular Surgery

## 2014-01-20 ENCOUNTER — Ambulatory Visit
Admission: RE | Admit: 2014-01-20 | Discharge: 2014-01-20 | Disposition: A | Discharge status: Home / Self Care | Payer: Medicare HMO | Source: Ambulatory Visit | Attending: Vascular Surgery | Admitting: Vascular Surgery

## 2014-01-20 ENCOUNTER — Encounter (INDEPENDENT_AMBULATORY_CARE_PROVIDER_SITE_OTHER): Payer: Self-pay

## 2014-01-20 VITALS — BP 135/60 | HR 79 | Ht 64.0 in | Wt 135.0 lb

## 2014-01-20 DIAGNOSIS — Z48812 Encounter for surgical aftercare following surgery on the circulatory system: Secondary | ICD-10-CM

## 2014-01-20 DIAGNOSIS — I714 Abdominal aortic aneurysm, without rupture, unspecified: Secondary | ICD-10-CM

## 2014-01-20 DIAGNOSIS — I728 Aneurysm of other specified arteries: Secondary | ICD-10-CM | POA: Insufficient documentation

## 2014-01-20 MED ORDER — IOHEXOL 350 MG/ML SOLN
80.0000 mL | Freq: Once | INTRAVENOUS | Status: AC | PRN
  Administered 2014-01-20: 80 mL via INTRAVENOUS

## 2014-01-20 NOTE — Addendum Note (Signed)
Addended by: Mena Goes on: 01/20/2014 03:38 PM   Modules accepted: Orders

## 2014-01-20 NOTE — Progress Notes (Signed)
[facility]  HISTORY AND PHYSICAL    History of Present Illness:  Patient is a 80 y.o. 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 He previously underwent aorto to bilateral common iliac artery grafting for iliac artery aneurysm. This was done in 2005. He overall reports he is doing well. He does have some chronic abdominal pain that occurs intermittently with eating spicy food. He denies food fear. He denies weight loss. He denies postprandial abdominal pain other than spicy foods. Other medical problems include diabetes, hyperlipidemia, hiatal hernia all of which are currently stable.    Past Medical History   Diagnosis  Date   .  Diabetes mellitus without complication     .  Hyperlipidemia     .  Hiatal hernia     .  Celiac artery aneurysm     .  AAA (abdominal aortic aneurysm)         Past Surgical History   Procedure  Laterality  Date   .  Abdominal aortic aneurysm repair    10-2003       also had iliac aneurysm repair   .  Eye surgery  Bilateral         cataracts   .  Cholecystectomy           Laparoscopic cholecystectomy     Social History History   Substance Use Topics   .  Smoking status:  Never Smoker    .  Smokeless tobacco:  Not on file   .  Alcohol Use:  No     Family History Family History   Problem  Relation  Age of Onset   .  Arthritis  Mother         Rheumatoid arthritis     Allergies    Allergies   Allergen  Reactions   .  Iodine     .  Shellfish Allergy          Current Outpatient Prescriptions   Medication  Sig  Dispense  Refill   .  aspirin 81 MG tablet  Take 81 mg by mouth daily.         Marland Kitchen  loratadine (CLARITIN) 10 MG tablet  Take 10 mg by mouth daily.         .  mometasone (NASONEX) 50 MCG/ACT nasal spray  Place 2 sprays into the nose daily.         Marland Kitchen  omeprazole (PRILOSEC) 20 MG capsule  Take 20 mg by mouth 2 (two) times daily before a meal.         .  rosuvastatin (CRESTOR) 10 MG tablet   Take 10 mg by mouth daily.         .  saxagliptin HCl (ONGLYZA) 5 MG TABS tablet  Take 5 mg by mouth daily.         Marland Kitchen  levothyroxine (SYNTHROID, LEVOTHROID) 50 MCG tablet           .  metFORMIN (GLUCOPHAGE) 1000 MG tablet  Take 1,000 mg by mouth 2 (two) times daily.            No current facility-administered medications for this visit.     ROS:    General:  No weight loss, Fever, chills  HEENT: No recent headaches, no nasal bleeding, no visual changes, no sore throat  Neurologic: No dizziness, blackouts, seizures. No recent symptoms of stroke or mini- stroke. No recent episodes of slurred speech,  or temporary blindness.  Cardiac: No recent episodes of chest pain/pressure, no shortness of breath at rest.  No shortness of breath with exertion.  Denies history of atrial fibrillation or irregular heartbeat  Vascular: No history of rest pain in feet.  No history of claudication.  No history of non-healing ulcer, No history of DVT    Pulmonary: No home oxygen, no productive cough, no hemoptysis,  No asthma or wheezing  Musculoskeletal:  [ ]  Arthritis, [ ]  Low back pain,  [ ]  Joint pain  Hematologic:No history of hypercoagulable state.  No history of easy bleeding.  No history of anemia  Gastrointestinal: No hematochezia or melena,  No gastroesophageal reflux, no trouble swallowing  Urinary: [ ]  chronic Kidney disease, [ ]  on HD - [ ]  MWF or [ ]  TTHS, [ ]  Burning with urination, [ ]  Frequent urination, [ ]  Difficulty urinating;    Skin: No rashes  Psychological: No history of anxiety,  No history of depression   Physical Examination   Filed Vitals:   01/20/14 1419  BP: 135/60  Pulse: 79  Height: 5\' 4"  (1.626 m)  Weight: 135 lb (61.236 kg)  SpO2: 98%     General:  Alert and oriented, no acute distress HEENT: Normal Neck: No bruit or JVD Pulmonary: Clear to auscultation bilaterally Cardiac: Regular Rate and Rhythm without murmur Abdomen: Soft, non-tender, non-distended,  no mass, well-healed midline scar no hernia, no pulsatile mass no bruit Skin: No rash Extremity Pulses:  2+ radial, brachial, femoral, dorsalis pedis, posterior tibial pulses bilaterally Musculoskeletal: No deformity or edema     Neurologic: Upper and lower extremity motor 5/5 and symmetric  CT scan of the abdomen and pelvis reviewed today. Aortobiiliac graft is intact no pseudoaneurysms celiac artery aneurysm remains stable in size 14 mm today essentially unchanged from 2007  ASSESSMENT:  Overall patient doing well.  Stable size of celiac artery aneurysm over almost 10 years PLAN:  followup 5 years CT Angio abdomen pelvis Ruta Hinds, MD [facility] Office: [phone] Pager: [phone]

## 2015-07-07 DIAGNOSIS — E039 Hypothyroidism, unspecified: Secondary | ICD-10-CM | POA: Diagnosis not present

## 2015-07-07 DIAGNOSIS — E119 Type 2 diabetes mellitus without complications: Secondary | ICD-10-CM | POA: Diagnosis not present

## 2015-07-07 DIAGNOSIS — Z125 Encounter for screening for malignant neoplasm of prostate: Secondary | ICD-10-CM | POA: Diagnosis not present

## 2015-07-07 DIAGNOSIS — I10 Essential (primary) hypertension: Secondary | ICD-10-CM | POA: Diagnosis not present

## 2015-07-07 DIAGNOSIS — E785 Hyperlipidemia, unspecified: Secondary | ICD-10-CM | POA: Diagnosis not present

## 2015-07-13 DIAGNOSIS — K219 Gastro-esophageal reflux disease without esophagitis: Secondary | ICD-10-CM | POA: Diagnosis not present

## 2015-07-13 DIAGNOSIS — E1165 Type 2 diabetes mellitus with hyperglycemia: Secondary | ICD-10-CM | POA: Diagnosis not present

## 2015-07-13 DIAGNOSIS — E785 Hyperlipidemia, unspecified: Secondary | ICD-10-CM | POA: Diagnosis not present

## 2015-07-13 DIAGNOSIS — Z0000 Encounter for general adult medical examination without abnormal findings: Secondary | ICD-10-CM | POA: Diagnosis not present

## 2015-07-13 DIAGNOSIS — Z125 Encounter for screening for malignant neoplasm of prostate: Secondary | ICD-10-CM | POA: Diagnosis not present

## 2015-07-13 DIAGNOSIS — Z23 Encounter for immunization: Secondary | ICD-10-CM | POA: Diagnosis not present

## 2015-07-13 DIAGNOSIS — E039 Hypothyroidism, unspecified: Secondary | ICD-10-CM | POA: Diagnosis not present

## 2015-07-21 DIAGNOSIS — E039 Hypothyroidism, unspecified: Secondary | ICD-10-CM | POA: Diagnosis not present

## 2015-07-21 DIAGNOSIS — Z7984 Long term (current) use of oral hypoglycemic drugs: Secondary | ICD-10-CM | POA: Diagnosis not present

## 2015-07-21 DIAGNOSIS — R636 Underweight: Secondary | ICD-10-CM | POA: Diagnosis not present

## 2015-07-21 DIAGNOSIS — E119 Type 2 diabetes mellitus without complications: Secondary | ICD-10-CM | POA: Diagnosis not present

## 2015-10-09 DIAGNOSIS — D692 Other nonthrombocytopenic purpura: Secondary | ICD-10-CM | POA: Diagnosis not present

## 2015-10-09 DIAGNOSIS — L821 Other seborrheic keratosis: Secondary | ICD-10-CM | POA: Diagnosis not present

## 2015-10-09 DIAGNOSIS — L570 Actinic keratosis: Secondary | ICD-10-CM | POA: Diagnosis not present

## 2015-10-09 DIAGNOSIS — D1801 Hemangioma of skin and subcutaneous tissue: Secondary | ICD-10-CM | POA: Diagnosis not present

## 2015-10-20 DIAGNOSIS — E039 Hypothyroidism, unspecified: Secondary | ICD-10-CM | POA: Diagnosis not present

## 2015-10-20 DIAGNOSIS — R636 Underweight: Secondary | ICD-10-CM | POA: Diagnosis not present

## 2015-10-20 DIAGNOSIS — E119 Type 2 diabetes mellitus without complications: Secondary | ICD-10-CM | POA: Diagnosis not present

## 2015-12-18 DIAGNOSIS — E119 Type 2 diabetes mellitus without complications: Secondary | ICD-10-CM | POA: Diagnosis not present

## 2015-12-18 DIAGNOSIS — E039 Hypothyroidism, unspecified: Secondary | ICD-10-CM | POA: Diagnosis not present

## 2015-12-18 DIAGNOSIS — Z7984 Long term (current) use of oral hypoglycemic drugs: Secondary | ICD-10-CM | POA: Diagnosis not present

## 2015-12-19 DIAGNOSIS — E11 Type 2 diabetes mellitus: Secondary | ICD-10-CM | POA: Diagnosis not present

## 2016-01-25 DIAGNOSIS — E119 Type 2 diabetes mellitus without complications: Secondary | ICD-10-CM | POA: Diagnosis not present

## 2016-01-25 DIAGNOSIS — E039 Hypothyroidism, unspecified: Secondary | ICD-10-CM | POA: Diagnosis not present

## 2016-01-25 DIAGNOSIS — Z7984 Long term (current) use of oral hypoglycemic drugs: Secondary | ICD-10-CM | POA: Diagnosis not present

## 2016-02-28 DIAGNOSIS — E119 Type 2 diabetes mellitus without complications: Secondary | ICD-10-CM | POA: Diagnosis not present

## 2016-02-29 DIAGNOSIS — E7800 Pure hypercholesterolemia, unspecified: Secondary | ICD-10-CM | POA: Diagnosis not present

## 2016-02-29 DIAGNOSIS — G4730 Sleep apnea, unspecified: Secondary | ICD-10-CM | POA: Diagnosis not present

## 2016-02-29 DIAGNOSIS — F324 Major depressive disorder, single episode, in partial remission: Secondary | ICD-10-CM | POA: Diagnosis not present

## 2016-02-29 DIAGNOSIS — Z9889 Other specified postprocedural states: Secondary | ICD-10-CM | POA: Diagnosis not present

## 2016-02-29 DIAGNOSIS — E039 Hypothyroidism, unspecified: Secondary | ICD-10-CM | POA: Diagnosis not present

## 2016-02-29 DIAGNOSIS — I739 Peripheral vascular disease, unspecified: Secondary | ICD-10-CM | POA: Diagnosis not present

## 2016-02-29 DIAGNOSIS — K219 Gastro-esophageal reflux disease without esophagitis: Secondary | ICD-10-CM | POA: Diagnosis not present

## 2016-02-29 DIAGNOSIS — E119 Type 2 diabetes mellitus without complications: Secondary | ICD-10-CM | POA: Diagnosis not present

## 2016-02-29 DIAGNOSIS — I728 Aneurysm of other specified arteries: Secondary | ICD-10-CM | POA: Diagnosis not present

## 2016-02-29 DIAGNOSIS — Z7984 Long term (current) use of oral hypoglycemic drugs: Secondary | ICD-10-CM | POA: Diagnosis not present

## 2016-02-29 DIAGNOSIS — I714 Abdominal aortic aneurysm, without rupture: Secondary | ICD-10-CM | POA: Diagnosis not present

## 2016-04-29 DIAGNOSIS — Z79899 Other long term (current) drug therapy: Secondary | ICD-10-CM | POA: Diagnosis not present

## 2016-04-29 DIAGNOSIS — E039 Hypothyroidism, unspecified: Secondary | ICD-10-CM | POA: Diagnosis not present

## 2016-04-29 DIAGNOSIS — Z7984 Long term (current) use of oral hypoglycemic drugs: Secondary | ICD-10-CM | POA: Diagnosis not present

## 2016-04-29 DIAGNOSIS — Z5181 Encounter for therapeutic drug level monitoring: Secondary | ICD-10-CM | POA: Diagnosis not present

## 2016-04-29 DIAGNOSIS — E119 Type 2 diabetes mellitus without complications: Secondary | ICD-10-CM | POA: Diagnosis not present

## 2016-09-25 DIAGNOSIS — Z0000 Encounter for general adult medical examination without abnormal findings: Secondary | ICD-10-CM | POA: Diagnosis not present

## 2016-09-25 DIAGNOSIS — E119 Type 2 diabetes mellitus without complications: Secondary | ICD-10-CM | POA: Diagnosis not present

## 2016-09-25 DIAGNOSIS — R350 Frequency of micturition: Secondary | ICD-10-CM | POA: Diagnosis not present

## 2016-09-25 DIAGNOSIS — F324 Major depressive disorder, single episode, in partial remission: Secondary | ICD-10-CM | POA: Diagnosis not present

## 2016-09-25 DIAGNOSIS — E7800 Pure hypercholesterolemia, unspecified: Secondary | ICD-10-CM | POA: Diagnosis not present

## 2016-09-25 DIAGNOSIS — R7309 Other abnormal glucose: Secondary | ICD-10-CM | POA: Diagnosis not present

## 2016-09-25 DIAGNOSIS — Z23 Encounter for immunization: Secondary | ICD-10-CM | POA: Diagnosis not present

## 2016-09-25 DIAGNOSIS — Z7984 Long term (current) use of oral hypoglycemic drugs: Secondary | ICD-10-CM | POA: Diagnosis not present

## 2016-09-25 DIAGNOSIS — E039 Hypothyroidism, unspecified: Secondary | ICD-10-CM | POA: Diagnosis not present

## 2016-09-25 DIAGNOSIS — Z79899 Other long term (current) drug therapy: Secondary | ICD-10-CM | POA: Diagnosis not present

## 2016-10-18 DIAGNOSIS — Z1211 Encounter for screening for malignant neoplasm of colon: Secondary | ICD-10-CM | POA: Diagnosis not present

## 2016-10-31 DIAGNOSIS — Z7984 Long term (current) use of oral hypoglycemic drugs: Secondary | ICD-10-CM | POA: Diagnosis not present

## 2016-10-31 DIAGNOSIS — E039 Hypothyroidism, unspecified: Secondary | ICD-10-CM | POA: Diagnosis not present

## 2016-10-31 DIAGNOSIS — N529 Male erectile dysfunction, unspecified: Secondary | ICD-10-CM | POA: Diagnosis not present

## 2016-10-31 DIAGNOSIS — E119 Type 2 diabetes mellitus without complications: Secondary | ICD-10-CM | POA: Diagnosis not present

## 2016-10-31 DIAGNOSIS — Z5181 Encounter for therapeutic drug level monitoring: Secondary | ICD-10-CM | POA: Diagnosis not present

## 2016-10-31 DIAGNOSIS — R32 Unspecified urinary incontinence: Secondary | ICD-10-CM | POA: Diagnosis not present

## 2016-11-05 DIAGNOSIS — L570 Actinic keratosis: Secondary | ICD-10-CM | POA: Diagnosis not present

## 2016-11-05 DIAGNOSIS — L821 Other seborrheic keratosis: Secondary | ICD-10-CM | POA: Diagnosis not present

## 2016-11-05 DIAGNOSIS — D1801 Hemangioma of skin and subcutaneous tissue: Secondary | ICD-10-CM | POA: Diagnosis not present

## 2017-01-16 ENCOUNTER — Emergency Department (HOSPITAL_COMMUNITY): Payer: PPO

## 2017-01-16 ENCOUNTER — Observation Stay (HOSPITAL_COMMUNITY): Payer: PPO

## 2017-01-16 ENCOUNTER — Encounter (HOSPITAL_COMMUNITY): Payer: Self-pay | Admitting: Emergency Medicine

## 2017-01-16 ENCOUNTER — Observation Stay (HOSPITAL_COMMUNITY)
Admission: EM | Admit: 2017-01-16 | Discharge: 2017-01-17 | Disposition: A | Discharge status: Home / Self Care | Payer: PPO | Attending: Family Medicine | Admitting: Family Medicine

## 2017-01-16 DIAGNOSIS — Z8679 Personal history of other diseases of the circulatory system: Secondary | ICD-10-CM | POA: Insufficient documentation

## 2017-01-16 DIAGNOSIS — K449 Diaphragmatic hernia without obstruction or gangrene: Secondary | ICD-10-CM | POA: Insufficient documentation

## 2017-01-16 DIAGNOSIS — G2581 Restless legs syndrome: Secondary | ICD-10-CM | POA: Insufficient documentation

## 2017-01-16 DIAGNOSIS — I6521 Occlusion and stenosis of right carotid artery: Secondary | ICD-10-CM | POA: Diagnosis not present

## 2017-01-16 DIAGNOSIS — I10 Essential (primary) hypertension: Secondary | ICD-10-CM

## 2017-01-16 DIAGNOSIS — E039 Hypothyroidism, unspecified: Secondary | ICD-10-CM | POA: Insufficient documentation

## 2017-01-16 DIAGNOSIS — Z7984 Long term (current) use of oral hypoglycemic drugs: Secondary | ICD-10-CM | POA: Insufficient documentation

## 2017-01-16 DIAGNOSIS — R29818 Other symptoms and signs involving the nervous system: Secondary | ICD-10-CM | POA: Diagnosis not present

## 2017-01-16 DIAGNOSIS — I6789 Other cerebrovascular disease: Secondary | ICD-10-CM | POA: Diagnosis not present

## 2017-01-16 DIAGNOSIS — R4701 Aphasia: Secondary | ICD-10-CM | POA: Diagnosis not present

## 2017-01-16 DIAGNOSIS — R4781 Slurred speech: Secondary | ICD-10-CM | POA: Insufficient documentation

## 2017-01-16 DIAGNOSIS — I714 Abdominal aortic aneurysm, without rupture: Secondary | ICD-10-CM | POA: Insufficient documentation

## 2017-01-16 DIAGNOSIS — M8580 Other specified disorders of bone density and structure, unspecified site: Secondary | ICD-10-CM | POA: Diagnosis not present

## 2017-01-16 DIAGNOSIS — Z79899 Other long term (current) drug therapy: Secondary | ICD-10-CM | POA: Insufficient documentation

## 2017-01-16 DIAGNOSIS — E785 Hyperlipidemia, unspecified: Secondary | ICD-10-CM | POA: Diagnosis not present

## 2017-01-16 DIAGNOSIS — I739 Peripheral vascular disease, unspecified: Secondary | ICD-10-CM | POA: Insufficient documentation

## 2017-01-16 DIAGNOSIS — Z7982 Long term (current) use of aspirin: Secondary | ICD-10-CM | POA: Insufficient documentation

## 2017-01-16 DIAGNOSIS — E119 Type 2 diabetes mellitus without complications: Secondary | ICD-10-CM | POA: Insufficient documentation

## 2017-01-16 DIAGNOSIS — R61 Generalized hyperhidrosis: Secondary | ICD-10-CM | POA: Insufficient documentation

## 2017-01-16 DIAGNOSIS — I6330 Cerebral infarction due to thrombosis of unspecified cerebral artery: Secondary | ICD-10-CM | POA: Insufficient documentation

## 2017-01-16 LAB — URINALYSIS, ROUTINE W REFLEX MICROSCOPIC
Bilirubin Urine: NEGATIVE
Glucose, UA: NEGATIVE mg/dL
Hgb urine dipstick: NEGATIVE
Ketones, ur: NEGATIVE mg/dL
Leukocytes, UA: NEGATIVE
Nitrite: NEGATIVE
Protein, ur: NEGATIVE mg/dL
Specific Gravity, Urine: 1.012 (ref 1.005–1.030)
pH: 7 (ref 5.0–8.0)

## 2017-01-16 LAB — COMPREHENSIVE METABOLIC PANEL
ALT: 18 U/L (ref 17–63)
AST: 23 U/L (ref 15–41)
Albumin: 3.6 g/dL (ref 3.5–5.0)
Alkaline Phosphatase: 58 U/L (ref 38–126)
Anion gap: 8 (ref 5–15)
BUN: 21 mg/dL — ABNORMAL HIGH (ref 6–20)
CO2: 24 mmol/L (ref 22–32)
Calcium: 9.4 mg/dL (ref 8.9–10.3)
Chloride: 104 mmol/L (ref 101–111)
Creatinine, Ser: 1.11 mg/dL (ref 0.61–1.24)
GFR calc Af Amer: >60 mL/min (ref >60)
GFR calc non Af Amer: 60 mL/min — ABNORMAL LOW (ref >60)
Glucose, Bld: 119 mg/dL — ABNORMAL HIGH (ref 65–99)
Potassium: 4.2 mmol/L (ref 3.5–5.1)
Sodium: 136 mmol/L (ref 135–145)
Total Bilirubin: 0.3 mg/dL (ref 0.3–1.2)
Total Protein: 5.8 g/dL — ABNORMAL LOW (ref 6.5–8.1)

## 2017-01-16 LAB — CBC
HCT: 38.6 % — ABNORMAL LOW (ref 39.0–52.0)
Hemoglobin: 13.4 g/dL (ref 13.0–17.0)
MCH: 32.1 pg (ref 26.0–34.0)
MCHC: 34.7 g/dL (ref 30.0–36.0)
MCV: 92.6 fL (ref 78.0–100.0)
Platelets: 138 10*3/uL — ABNORMAL LOW (ref 150–400)
RBC: 4.17 MIL/uL — ABNORMAL LOW (ref 4.22–5.81)
RDW: 12.8 % (ref 11.5–15.5)
WBC: 6 10*3/uL (ref 4.0–10.5)

## 2017-01-16 LAB — PROTIME-INR
INR: 1.04
Prothrombin Time: 13.7 seconds (ref 11.4–15.2)

## 2017-01-16 LAB — GLUCOSE, CAPILLARY: Glucose-Capillary: 301 mg/dL — ABNORMAL HIGH (ref 65–99)

## 2017-01-16 LAB — I-STAT CHEM 8, ED
BUN: 24 mg/dL — ABNORMAL HIGH (ref 6–20)
Calcium, Ion: 1.12 mmol/L — ABNORMAL LOW (ref 1.15–1.40)
Chloride: 101 mmol/L (ref 101–111)
Creatinine, Ser: 1.1 mg/dL (ref 0.61–1.24)
Glucose, Bld: 120 mg/dL — ABNORMAL HIGH (ref 65–99)
HCT: 39 % (ref 39.0–52.0)
Hemoglobin: 13.3 g/dL (ref 13.0–17.0)
Potassium: 4.2 mmol/L (ref 3.5–5.1)
Sodium: 139 mmol/L (ref 135–145)
TCO2: 24 mmol/L (ref 0–100)

## 2017-01-16 LAB — ETHANOL: Alcohol, Ethyl (B): <5 mg/dL (ref <5)

## 2017-01-16 LAB — RAPID URINE DRUG SCREEN, HOSP PERFORMED
Amphetamines: NOT DETECTED
Barbiturates: NOT DETECTED
Benzodiazepines: NOT DETECTED
Cocaine: NOT DETECTED
Opiates: NOT DETECTED
Tetrahydrocannabinol: NOT DETECTED

## 2017-01-16 LAB — DIFFERENTIAL
Basophils Absolute: 0 10*3/uL (ref 0.0–0.1)
Basophils Relative: 1 %
Eosinophils Absolute: 0.2 10*3/uL (ref 0.0–0.7)
Eosinophils Relative: 3 %
Lymphocytes Relative: 25 %
Lymphs Abs: 1.5 10*3/uL (ref 0.7–4.0)
Monocytes Absolute: 0.6 10*3/uL (ref 0.1–1.0)
Monocytes Relative: 10 %
Neutro Abs: 3.7 10*3/uL (ref 1.7–7.7)
Neutrophils Relative %: 61 %

## 2017-01-16 LAB — APTT: aPTT: 31 seconds (ref 24–36)

## 2017-01-16 LAB — I-STAT TROPONIN, ED: Troponin i, poc: 0 ng/mL (ref 0.00–0.08)

## 2017-01-16 MED ORDER — PANTOPRAZOLE SODIUM 40 MG PO TBEC
40.0000 mg | DELAYED_RELEASE_TABLET | Freq: Every day | ORAL | Status: DC
  Administered 2017-01-16 – 2017-01-17 (×2): 40 mg via ORAL
  Filled 2017-01-16 (×2): qty 1

## 2017-01-16 MED ORDER — INSULIN ASPART 100 UNIT/ML ~~LOC~~ SOLN
0.0000 [IU] | SUBCUTANEOUS | Status: DC
  Administered 2017-01-17: 1 [IU] via SUBCUTANEOUS
  Administered 2017-01-17 (×2): 2 [IU] via SUBCUTANEOUS

## 2017-01-16 MED ORDER — SODIUM CHLORIDE 0.9 % IV SOLN
INTRAVENOUS | Status: AC
  Administered 2017-01-16: 21:00:00 via INTRAVENOUS

## 2017-01-16 MED ORDER — INSULIN ASPART 100 UNIT/ML ~~LOC~~ SOLN: 0.0000 [IU] | SUBCUTANEOUS | Status: DC

## 2017-01-16 MED ORDER — ROSUVASTATIN CALCIUM 10 MG PO TABS
10.0000 mg | ORAL_TABLET | Freq: Every day | ORAL | Status: DC
  Administered 2017-01-16: 10 mg via ORAL
  Filled 2017-01-16: qty 2
  Filled 2017-01-16 (×2): qty 1

## 2017-01-16 MED ORDER — STROKE: EARLY STAGES OF RECOVERY BOOK
Freq: Once | Status: AC
  Administered 2017-01-16: 1
  Filled 2017-01-16: qty 1

## 2017-01-16 MED ORDER — DIAZEPAM 5 MG PO TABS: 2.5000 mg | ORAL_TABLET | Freq: Every evening | ORAL | Status: DC | PRN

## 2017-01-16 MED ORDER — IOPAMIDOL (ISOVUE-370) INJECTION 76%
INTRAVENOUS | Status: AC
  Filled 2017-01-16: qty 50

## 2017-01-16 MED ORDER — SENNOSIDES-DOCUSATE SODIUM 8.6-50 MG PO TABS: 1.0000 | ORAL_TABLET | Freq: Every evening | ORAL | Status: DC | PRN

## 2017-01-16 MED ORDER — ASPIRIN 325 MG PO TABS
325.0000 mg | ORAL_TABLET | Freq: Every day | ORAL | Status: DC
  Administered 2017-01-16 – 2017-01-17 (×2): 325 mg via ORAL
  Filled 2017-01-16 (×2): qty 1

## 2017-01-16 MED ORDER — VITAMIN B-12 1000 MCG PO TABS
1000.0000 ug | ORAL_TABLET | Freq: Every day | ORAL | Status: DC
  Administered 2017-01-17: 1000 ug via ORAL
  Filled 2017-01-16: qty 1

## 2017-01-16 MED ORDER — LEVOTHYROXINE SODIUM 75 MCG PO TABS
75.0000 ug | ORAL_TABLET | Freq: Every day | ORAL | Status: DC
  Administered 2017-01-17: 75 ug via ORAL
  Filled 2017-01-16 (×2): qty 1

## 2017-01-16 MED ORDER — METFORMIN HCL 500 MG PO TABS: 1000.0000 mg | ORAL_TABLET | Freq: Two times a day (BID) | ORAL | Status: DC

## 2017-01-16 MED ORDER — GLIMEPIRIDE 1 MG PO TABS
0.5000 mg | ORAL_TABLET | Freq: Every day | ORAL | Status: DC
  Administered 2017-01-17: 0.5 mg via ORAL
  Filled 2017-01-16: qty 0.5

## 2017-01-16 MED ORDER — ACETAMINOPHEN 325 MG PO TABS
650.0000 mg | ORAL_TABLET | Freq: Once | ORAL | Status: AC
  Administered 2017-01-16: 650 mg via ORAL
  Filled 2017-01-16: qty 2

## 2017-01-16 NOTE — ED Provider Notes (Signed)
[name] DEPT Provider Note   CSN: [id number] Arrival date & time: 01/16/17  1535     History   Chief Complaint Chief Complaint  Patient presents with  . Code Stroke    HPI CHAPMAN MATTEUCCI is a 83 y.o. male.  The history is provided by the patient.  Illness  This is a new problem. The current episode started 1 to 2 hours ago. Episode frequency: never before. The problem has been resolved. Pertinent negatives include no chest pain, no abdominal pain and no shortness of breath. Nothing aggravates the symptoms. Nothing relieves the symptoms. He has tried nothing for the symptoms.   83 year old male who presents with expressive aphasia. History of diabetes, hypertension, and peripheral arterial disease. States that she was driving to the [facility] with his wife when he had difficulty with speech delivery. States that he knew what he wanted to say but was unable to get the words out. Unsure of how long the episode lasted, but his wife drove him home, and by the time he returned home he reports that his speech changes was improving. Denies any vision changes, focal numbness or weakness. Patient's daughter does state that over the past week or 2 he has not been feeling well, and has been complaining of intermittent dizziness. Denies any difficulty walking. EMS was called, and in route code stroke was activated.   Past Medical History:  Diagnosis Date  . AAA (abdominal aortic aneurysm) ([place])   . Celiac artery aneurysm ([place])   . Diabetes mellitus without complication ([place])   . Hiatal hernia   . Hyperlipidemia     Patient Active Problem List   Diagnosis Date Noted  . Celiac artery aneurysm ([place]) 01/20/2014  . Aftercare following surgery of the circulatory system, [place] 12/09/2013  . Abdominal aneurysm without mention of rupture 12/09/2013  . PAD (peripheral artery disease) ([place]) 04/14/2013    Past Surgical History:  Procedure Laterality Date  . ABDOMINAL AORTIC ANEURYSM REPAIR   10-2003   also had iliac aneurysm repair  . CHOLECYSTECTOMY     Laparoscopic cholecystectomy  . EYE SURGERY Bilateral    cataracts       Home Medications    Prior to Admission medications   Medication Sig Start Date End Date Taking? Authorizing Provider  aspirin 81 MG tablet Take 81 mg by mouth daily.    [provider]  levothyroxine (SYNTHROID, LEVOTHROID) 50 MCG tablet  09/15/13   [provider]  loratadine (CLARITIN) 10 MG tablet Take 10 mg by mouth daily.    [provider]  metFORMIN (GLUCOPHAGE) 1000 MG tablet Take 1,000 mg by mouth 2 (two) times daily. 12/08/13   [provider]  mometasone (NASONEX) 50 MCG/ACT nasal spray Place 2 sprays into the nose daily.    [provider]  omeprazole (PRILOSEC) 20 MG capsule Take 20 mg by mouth 2 (two) times daily before a meal.    [provider]  rosuvastatin (CRESTOR) 10 MG tablet Take 10 mg by mouth daily.    [provider]  saxagliptin HCl (ONGLYZA) 5 MG TABS tablet Take 5 mg by mouth daily.    [provider]    Family History Family History  Problem Relation Age of Onset  . Arthritis Mother        Rheumatoid arthritis    Social History Social History  Substance Use Topics  . Smoking status: Never Smoker  . Smokeless tobacco: Never Used  . Alcohol use No  Allergies   Metformin and related and Shellfish allergy   Review of Systems Review of Systems  Constitutional: Negative for fever.  Respiratory: Negative for shortness of breath.   Cardiovascular: Negative for chest pain.  Gastrointestinal: Negative for abdominal pain.  Neurological: Positive for speech difficulty.  All other systems reviewed and are negative.    Physical Exam Updated Vital Signs Wt 63.8 kg (140 lb 10.5 oz)   SpO2 98%   BMI 24.14 kg/m   Physical Exam Physical Exam  Nursing note and vitals reviewed. Constitutional: Well developed, well nourished, non-toxic,  and in no acute distress Head: Normocephalic and atraumatic.  Mouth/Throat: Oropharynx is clear and moist.  Neck: Normal range of motion. Neck supple.  Cardiovascular: Normal rate and regular rhythm.   Pulmonary/Chest: Effort normal and breath sounds normal.  Abdominal: Soft. There is no tenderness. There is no rebound and no guarding.  Musculoskeletal: Normal range of motion.  Skin: Skin is warm and dry.  Psychiatric: Cooperative Neurological:  Alert, oriented to person, place, time, and situation. Memory grossly in tact. Fluent speech. No dysarthria or aphasia.  Cranial nerves: VF are full.  EOMI without nystagmus. No gaze deviation. Facial muscles symmetric with activation. Sensation to light touch over face in tact bilaterally. Hearing grossly in tact. Palate elevates symmetrically. Head turn and shoulder shrug are intact. Tongue midline.  Reflexes defered.  Muscle bulk and tone normal. Questionable mild pronator drift of left arm. No drift in lower extremities.  Sensation to light touch is in tact throughout in bilateral upper and lower extremities. Coordination reveals no dysmetria with finger to nose.    ED Treatments / Results  Labs (all labs ordered are listed, but only abnormal results are displayed) Labs Reviewed  CBC - Abnormal; Notable for the following:       Result Value   RBC 4.17 (*)    HCT 38.6 (*)    Platelets 138 (*)    All other components within normal limits  I-STAT CHEM 8, ED - Abnormal; Notable for the following:    BUN 24 (*)    Glucose, Bld 120 (*)    Calcium, Ion 1.12 (*)    All other components within normal limits  PROTIME-INR  APTT  DIFFERENTIAL  ETHANOL  COMPREHENSIVE METABOLIC PANEL  RAPID URINE DRUG SCREEN, HOSP PERFORMED  URINALYSIS, ROUTINE W REFLEX MICROSCOPIC  I-STAT TROPONIN, ED    EKG  EKG Interpretation None       Radiology Ct Head Code Stroke W/o Cm  Result Date: 01/16/2017 CLINICAL DATA:  Code stroke. 83 year old male,  not otherwise specified. EXAM: CT HEAD WITHOUT CONTRAST TECHNIQUE: Contiguous axial images were obtained from the base of the skull through the vertex without intravenous contrast. COMPARISON:  None. FINDINGS: Brain: No midline shift, mass effect, or evidence of intracranial mass lesion. No acute intracranial hemorrhage identified. No ventriculomegaly. Patchy white matter hypodensity, most pronounced at the left anterior deep white matter capsules. No cortical encephalomalacia identified. Subtle asymmetric hypodensity in the left occipital pole. No other changes of acute cortically based infarct identified. Vascular: Calcified atherosclerosis at the skull base. No suspicious intracranial vascular hyperdensity. Skull: Osteopenia. No acute osseous abnormality identified. Sinuses/Orbits:  Clear aside from left mastoid sclerosis. Other: No acute orbit or scalp soft tissue finding. ASPECTS [facility] Stroke Program Early CT Score) - Ganglionic level infarction (caudate, lentiform nuclei, internal capsule, insula, M1-M3 cortex): 7 - Supraganglionic infarction (M4-M6 cortex): 3 Total score (0-10 with 10 being normal): 10 IMPRESSION: 1. Possible left  PCA infarct with subtle hypodensity in the left occipital pole. No associated hemorrhage or mass effect. 2. No other CT evidence of acute cortically based infarct, with ASPECTS of 10. 3. The above was relayed via text pager to Dr. Roland Rack on 01/16/2017 at 15:49 . Electronically Signed   By: Genevie Ann M.D.   On: 01/16/2017 15:50    Procedures Procedures (including critical care time) CRITICAL CARE Performed by: Forde Dandy   Total critical care time: 31 minutes  Critical care time was exclusive of separately billable procedures and treating other patients.  Critical care was necessary to treat or prevent imminent or life-threatening deterioration.  Critical care was time spent personally by me on the following activities: development of treatment plan with  patient and/or surrogate as well as nursing, discussions with consultants, evaluation of patient's response to treatment, examination of patient, obtaining history from patient or surrogate, ordering and performing treatments and interventions, ordering and review of laboratory studies, ordering and review of radiographic studies, pulse oximetry and re-evaluation of patient's condition.  Medications Ordered in ED Medications  iopamidol (ISOVUE-370) 76 % injection (not administered)     Initial Impression / Assessment and Plan / ED Course  I have reviewed the triage vital signs and the nursing notes.  Pertinent labs & imaging results that were available during my care of the patient were reviewed by me and considered in my medical decision making (see chart for details).     Presents as code stroke. On arrival to the ED, speech rapidly improving and close to baseline on my evaluation. Slight questionable pronator drift the left arm, and he reports feeling heaviness in the left leg, but without discernible weakness. CT had visualized, and radiology expresses concern for possible left PCA stroke. Evaluated by Dr. Leonel Ramsay from neurology who recommended admission for stroke work-up. Given rapidly improving symptoms, not felt to be tpa candidate.   Final Clinical Impressions(s) / ED Diagnoses   Final diagnoses:  Aphasia  Aphasia    New Prescriptions New Prescriptions   No medications on file     Forde Dandy, MD 01/16/17 1610

## 2017-01-16 NOTE — Code Documentation (Signed)
83yo male arriving to [facility] via [place] at 69.  Patient was driving in the car with his wife at 78 when he had sudden onset difficulty getting his words out.  Patient drove home and 911 was called.  EMS assessed aphasia and activated a code stroke.  Stroke team at the bedside on patient arrival.  Labs drawn and patient cleared for CT by Dr. Kathrynn Humble. Patient to CT with team.  CT and CTA completed.  NIHSS 2, see documentation for details and code stroke times.  Patient initially with mild left nasolabial fold flattening and minor word substitution on arrival.  Patient exam improved to NIHSS 1, see documentation.  Patient's family to the bedside.  Patient with recent issues with hypoglycemia and feelings of "pressure" in his head over the last few days.  Patient denies feeling hypoglycemic during event today.  Glucose 119 on arrival.  Dr. Leonel Ramsay at the bedside.  No acute stroke treatment at this time d/t symptoms mild and improving.  Patient remains in the window to treat with tPA until [place] should symptoms worsen.  Patient's family updated on the plan of care.  Bedside handoff with ED RN Danelle Berry.

## 2017-01-16 NOTE — ED Triage Notes (Signed)
Pt to ER as activated code stroke - LSN @ 14:05 witnessed by wife while driving vehicle, patient had sudden onset of slurred speech and expressive aphasia. Hx of diabetes. VSS. Not on anticoagulation.   On arrival to ER patient noted to have mild difficulty forming words and right facial droop. Airway cleared by MD Kathrynn Humble and patient taken to CT.

## 2017-01-16 NOTE — ED Notes (Signed)
Admitting team at bedside.

## 2017-01-16 NOTE — Consult Note (Signed)
Requesting Physician: MD attending    Chief Complaint: Transient aphasia  History obtained from:  Patient   HPI:                                                                                                                                      [name] [name] is an 83 y.o. male with a past medical history significant for diabetes, hypothyroidism, hyperlipidemia who presented to [facility] via EMS with complaints of aphasia. On arrival, his symptoms were largely resolved. [name] [name] stated that [name] [name] was on his way to the local [employer] around 1400 when [name] [name] noticed [name] [name] was having trouble getting his words out, although at this time it was not particularly bothersome. His wife was with him at the [facility] when around 1410, [name] [name] was unable to speak. His wife was witness to this. At that time, the two went home, and [name] [name] feels as though symptoms were resolving on the way home. EMS was called for [name] [name] to be brought in for evaluation.  Pertinent Imaging: CT head 7/19: Possible left PCA infarct with subtle hypodensity in the left occipital pole. No associated hemorrhage or mass effect. CTA head/neck 7/19: Negative for emergent large vessel occlusion or significant large vessel stenosis.  Pertinent Medications: Aspirin 81mg  daily Synthroid 76mcg daily  Date last known well: Date: 01/16/2017 Time last known well: Time: 02:00  tPA Given: No: Minimal symptoms  Modified Rankin Score: 0  Stroke Risk Factors - diabetes mellitus and hyperlipidemia   Past Medical History:  Diagnosis Date  . AAA (abdominal aortic aneurysm)   . Celiac artery aneurysm   . Diabetes mellitus without complication   . Hiatal hernia   . Hyperlipidemia     Past Surgical History:  Procedure Laterality Date  . ABDOMINAL AORTIC ANEURYSM REPAIR  10-2003   also had iliac aneurysm repair  . CHOLECYSTECTOMY     Laparoscopic cholecystectomy  . EYE SURGERY Bilateral    cataracts    Family History  Problem Relation Age of  Onset  . Arthritis Mother        Rheumatoid arthritis     reports that [name] [name] has never smoked. [name] [name] does not have any smokeless tobacco history on file. [name] [name] reports that [name] [name] does not drink alcohol or use drugs.  Allergies  Allergen Reactions  . Metformin And Related Diarrhea    Pt states that [name] [name] has a reaction to the extended release metformin but takes the regular  . Shellfish Allergy     Medications:  No outpatient prescriptions have been marked as taking for the 01/16/17 encounter [facility] Encounter).    Review Of Systems:                                                                                                           History obtained from the patient  General: Patient acknowledges feeling "funny" over the past few weeks, thought to be secondary to problems with blood sugar control Psychological: Negative for any known behavioral disorder Ophthalmic: Negative for blurry or double vision ENT: Negative for tinnitus or vertigo Respiratory: Negative for cough Cardiovascular: Negative for chest pain, edema or irregular heartbeat Gastrointestinal: Negative for abdominal pain Genito-Urinary: Negative for dysuria Musculoskeletal: Negative for joint swelling or muscular weakness Neurological: As noted in HPI Dermatological: Negative for rash or skin changes, numbness or tingling  Weight 63.8 kg (140 lb 10.5 oz).   Physical Examination:                                                                                                      General: WDWN male.  HEENT:  Normocephalic, no lesions, without obvious abnormality.  Normal external eye and conjunctiva.  Normal TM's bilaterally.  Normal external ears. Normal external nose, mucus membranes and septum.  Normal pharynx. Cardiovascular: S1, S2 normal, pulses palpable throughout   Pulmonary: chest clear, no wheezing,  rales, normal symmetric air entry Abdomen: soft, midline scar present Extremities: no joint deformities, effusion, or inflammation Musculoskeletal: no joint tenderness, deformity or swelling  Skin: warm and dry, no hyperpigmentation, vitiligo, or suspicious lesions  Neurological Examination:                                                                                               Mental Status: [name] [name] is alert, oriented, thought content appropriate.  Speech mildly dysarthric with some evidence of aphasia - some word substitution. Able to follow 3-step commands without difficulty. Cranial Nerves: II: Visual fields grossly normal, pupils are equal, round, reactive to light. III,IV, VI: Ptosis not present, extra-ocular muscle movements intact bilaterally V,VII: Smile with questionable left-sided facial droop. Eyebrow raise is symmetric. Facial light touch and pinprick sensation intact bilaterally VIII: Hearing grossly intact IX,X: Uvula and palate rise symmetrically XI: SCM strength  5/5 XII: Midline tongue extension Motor: Right :     Upper extremity   5/5   Left:     Upper extremity   5-/5          Lower extremity   5/5    Lower extremity   5/5 Pronator drift subtly present on the left Sensory: Pinprick and light touch intact throughout, bilaterally Deep Tendon Reflexes: 2+ and symmetric throughout BUE. 1+ and symmetric BLE. Plantars: Right: downgoing   Left: downgoing Cerebellar: Finger-to-nose test without evidence of dysmetria or ataxia. Heel-to-shin test executed within normal limits. Gait: Not tested  Lab Results: Basic Metabolic Panel: No results for input(s): NA, K, CL, CO2, GLUCOSE, BUN, CREATININE, CALCIUM, MG, PHOS in the last 168 hours.  Liver Function Tests: No results for input(s): AST, ALT, ALKPHOS, BILITOT, PROT, ALBUMIN in the last 168 hours. No results for input(s): LIPASE, AMYLASE in the last 168 hours. No results for input(s): AMMONIA in the last  168 hours.  CBC: No results for input(s): WBC, NEUTROABS, HGB, HCT, MCV, PLT in the last 168 hours.  Cardiac Enzymes: No results for input(s): CKTOTAL, CKMB, CKMBINDEX, TROPONINI in the last 168 hours.  Lipid Panel: No results for input(s): CHOL, TRIG, HDL, CHOLHDL, VLDL, LDLCALC in the last 168 hours.  CBG: No results for input(s): GLUCAP in the last 168 hours.  Microbiology: No results found for this or any previous visit.  Coagulation Studies: No results for input(s): LABPROT, INR in the last 72 hours.  Imaging: No results found.   Assessment and plan per attending neurologist.  Lupita Raider PA-C [facility] [phone]  01/16/2017, 3:41 PM  Assessment and Plan:  83 year old male with acute onset of aphasia which is rapidly improving. [name] [name] still is making a few word substitution errors at this time, arrival [name] [name] improving. I strongly suspect that this represents either stroke or TIA. [name] [name] will need to be admitted for further evaluation.   1. HgbA1c, fasting lipid panel 2. MRI of the brain without contrast 3. PT consult, OT consult, Speech consult 4. Echocardiogram 5. Carotid dopplers are not needed given CTA already performed 6. Prophylactic therapy-Antiplatelet med: Aspirin - dose 325mg  daily 7. Risk factor modification 8. Telemetry monitoring 9. Frequent neuro checks 10 NPO until passes stroke swallow screen 11 please page stroke NP  Or  PA  Or MD from 8am -4 pm  as this patient from this time will be  followed by the stroke.   You can look them up on [email or web address]  Password [email or web address]  Roland Rack, MD [facility] [phone]  If 7pm- 7am, please page neurology on call as listed in [place].

## 2017-01-16 NOTE — ED Notes (Signed)
Dinner tray arrived 

## 2017-01-16 NOTE — H&P (Signed)
[facility] Admission History and Physical Service Pager: [phone]  Patient name: [name] [name] Medical record number: [id number] Date of birth: April 01, 1933 Age: 83 y.o. Gender: male  Primary Care Provider: Coletta Memos, PA-C Consultants: Neurology Code Status: FULL  Chief Complaint: expressive aphasia  Assessment and Plan: [name] [name] is a 83 y.o. male presenting with expressive aphasia. PMH is significant for Type II DM, HLD, hypothyroidism, restless leg syndrome, AAA.    Expressive Aphasia: DDx includes TIA, stroke, hypoglycemia.  Symptoms have resolved since the episode occurred at 1400 on 7/19, and no other neurological deficits are reported by the patient.  Less likely hypoglycemia because patient's blood sugars are usually in the mid-100's, and this symptom is inconsistent with his usual hypoglycemic symptoms of diaphoresis and nausea.  CT head showed possible left PCA infarct, but CTA negative for arterial occlusion, some tortuous vessels. Neurology recommended stroke work-up. -admit to telemetry, under attending Dr. Erin Hearing -HbA1C, fasting lipid panel -MRI brain w/o contrast -Echo -PT/OT/Speech consult -ASA 325 mg daily -neuro checks q2h -vitals per unit routine -NPO until passes stroke swallow screen  Type II DM: Unable to find last A1C, but patient carefully monitors blood sugars at home.  They usually range in the mid-100s and will sometimes go as low as 60, which will give him hypoglycemic symptoms.  Takes glimepiride 0.5 mg daily and metformin 1 g BID at home. -continue home glimepiride and metformin -CBGs q4h  HLD: Takes Crestor 10 mg daily at home.  Will follow up on lipid panel. -continue home Crestor  Hypothyroidism: No TSH on file.  Takes synthroid 75 mcg daily -continue home Synthroid -TSH  Restless Leg Syndrome:  Takes valium PRN for this at home. -continue home Valium  Abdominal Aortic Aneurysm: Patient  reports this is currently stable and was repaired in 2005. -CTA abdomen/pelvis    FEN/GI: NPO awaiting speech eval, protonix Prophylaxis: SCDs, ASA  Disposition: Home  History of Present Illness:  [name] [name] is a 83 y.o. male presenting with expressive aphasia.  The patient was driving with his wife this afternoon around 1400 and was unable to find his words during their conversation. They called their daughter, who is an FNP, and she instructed them to call 911.  Wife called EMS after he drove them back home, and he was brought to ED.  He improved verbally on the drive home. He denies focal weakness or feelings of lightheadedness or dizziness during episode.  Does endorse mild h/a on frontal region that feels like the chronic headaches he often gets. On Sinafil but not taken since Sunday; this is a new medication. Currently on aspirin 81 mg daily. No previous h/o similar episodes. No numbness, but wife noted minimal asymmetry of face with facial droop on left side. AAA in iliac region that was surgically repaired in 2005. Has DM on metformin and Amaryl, reported a hypoglycemic incident yesterday with symptoms of nausea and diaphoresis.  Review Of Systems: Per HPI with the following additions:  Otherwise the remainder of the systems were negative.  Review of Systems  Constitutional: Negative for diaphoresis and malaise/fatigue.  Cardiovascular: Negative for chest pain.  Gastrointestinal: Negative for abdominal pain.  Neurological: Positive for speech change and headaches. Negative for focal weakness, loss of consciousness and weakness.  Endo/Heme/Allergies: Positive for environmental allergies.    Patient Active Problem List   Diagnosis Date Noted  . Expressive aphasia 01/16/2017  . 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 Aftercare following  surgery of the circulatory system, NEC 12/09/2013  . Abdominal aneurysm without mention of rupture 12/09/2013  . PAD (peripheral  artery disease) ([place]) 04/14/2013    Past Medical History: Past Medical History:  Diagnosis Date  . AAA (abdominal aortic aneurysm) ([place])   . 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 Diabetes mellitus without complication ([place])   . Hiatal hernia   . Hyperlipidemia     Past Surgical History: Past Surgical History:  Procedure Laterality Date  . ABDOMINAL AORTIC ANEURYSM REPAIR  10-2003   also had iliac aneurysm repair  . CHOLECYSTECTOMY     Laparoscopic cholecystectomy  . EYE SURGERY Bilateral    cataracts    Social History: Social History  Substance Use Topics  . Smoking status: Never Smoker  . Smokeless tobacco: Never Used  . Alcohol use No   Additional social history: never smoker, no etoh or illicit drugs  Please also refer to relevant sections of EMR.  Family History: Family History  Problem Relation Age of Onset  . Arthritis Mother        Rheumatoid arthritis   Allergies and Medications: Allergies  Allergen Reactions  . Actos [Pioglitazone] Swelling  . Allegra [Fexofenadine] Other (See Comments)    stimulant  . Iodine Swelling  . Metformin And Related Diarrhea    Pt states that he has a reaction to the extended release metformin but takes the regular  . Onglyza [Saxagliptin] Other (See Comments)    Weight loss  . Shellfish Allergy Swelling   No current facility-administered medications on file prior to encounter.    Current Outpatient Prescriptions on File Prior to Encounter  Medication Sig Dispense Refill  . aspirin 81 MG tablet Take 81 mg by mouth at bedtime.     Marland Kitchen levothyroxine (SYNTHROID, LEVOTHROID) 75 MCG tablet Take 75 mcg by mouth daily before breakfast.     . loratadine (CLARITIN) 10 MG tablet Take 10 mg by mouth daily.    . metFORMIN (GLUCOPHAGE) 1000 MG tablet Take 1,000 mg by mouth 2 (two) times daily.    Marland Kitchen omeprazole (PRILOSEC) 20 MG capsule Take 20 mg by mouth at bedtime.     . rosuvastatin (CRESTOR) 10 MG tablet Take 10 mg by mouth at bedtime.        Objective: BP (!) 172/63   Pulse 74   Temp 98.7 F (37.1 C)   Resp 17   Ht 5\' 4"  (1.626 m)   Wt 135 lb (61.2 kg)   SpO2 99%   BMI 23.17 kg/m  Exam: General: pleasant, talkative man lying in bed in NAD Eyes: PERRL, EOMI ENTM: MMM, nares clear bilaterally Neck: no lymphadenopathy Cardiovascular: RRR, no MRG Respiratory: CTAB, no wheezing or crackles Abdomen: soft, nontender, nondistended, +bowel sounds MSK: 5/5 strength, full ROM in all extremities Skin: no ecchymosis Neuro: AAOx3, no focal deficits noted, CN II-XII intact Psych: appropriate mood and affect  Labs and Imaging: CBC BMET   Recent Labs Lab 01/16/17 1536 01/16/17 1544  WBC 6.0  --   HGB 13.4 13.3  HCT 38.6* 39.0  PLT 138*  --     Recent Labs Lab 01/16/17 1536 01/16/17 1544  NA 136 139  K 4.2 4.2  CL 104 101  CO2 24  --   BUN 21* 24*  CREATININE 1.11 1.10  GLUCOSE 119* 120*  CALCIUM 9.4  --      Ct Angio Head W Or Wo Contrast  Result Date: 01/16/2017 CLINICAL DATA:  83 year old male with aphasia,  slurred speech. EXAM: CT ANGIOGRAPHY HEAD AND NECK TECHNIQUE: Multidetector CT imaging of the head and neck was performed using the standard protocol during bolus administration of intravenous contrast. Multiplanar CT image reconstructions and MIPs were obtained to evaluate the vascular anatomy. Carotid stenosis measurements (when applicable) are obtained utilizing NASCET criteria, using the distal internal carotid diameter as the denominator. CONTRAST:  50 mL Isovue 370 COMPARISON:  Head CT without contrast 1546 hours today. FINDINGS: CTA NECK Skeleton: Heterogeneous bone mineralization throughout the cervical spine is probably degenerative in nature. No acute osseous abnormality identified. Upper chest: Negative lung apices and visible superior mediastinum. Other neck: Negative.  No cervical lymphadenopathy. Aortic arch: 3 vessel arch configuration. Mild for age soft and calcified arch atherosclerosis.  Right carotid system: Negative right CCA origin. Calcified plaque at the right ICA origin and distal bulb, but no stenosis. Tortuous cervical right ICA, and at the C1-C2 level the vessel it is dolichoectatic measuring up to 9 mm diameter. There is a linear defect through the central aspect of the vessel there (series 5, image 85 and series 9, image 76) which more resembles a short segment dissection than adherent clot. The vessel is otherwise widely patent to the skullbase. Left carotid system: No left CCA stenosis despite mild soft and calcified plaque. Widely patent left carotid bifurcation. Just distal to the bulb the vessel has a fairly normal caliber, but below the skullbase the cervical left ICA is dolichoectatic similar to the opposite right side, measuring 7-8 mm diameter. No beading of the vessel or intraluminal filling defect. Vertebral arteries:No proximal subclavian artery stenosis despite soft and calcified plaque on the left. Normal left vertebral artery origin. Calcified plaque at the right vertebral artery origin with only mild stenosis. Tortuous fairly codominant vertebral arteries are otherwise patent to the skullbase without stenosis. CTA HEAD Posterior circulation: Codominant distal vertebral arteries with patent PICA origins. Mild distal vertebral artery irregularity without stenosis. Tortuous vertebrobasilar junction. Mild basilar artery irregularity without stenosis. Normal SCA and left PCA origins. Fetal type right PCA. Left posterior communicating artery diminutive or absent. The left PCA is somewhat diminutive compared to the right, but is patent with no proximal stenosis. There is bilateral moderate distal PCA branch irregularity and stenosis which appears fairly symmetric (series 10, image 21). Anterior circulation: Both ICA siphons are patent and mildly dolichoectatic. There is distal cavernous and supraclinoid segment calcified plaque bilaterally. However this does not appear to be  hemodynamically significant. Ophthalmic and right posterior communicating artery origins are normal. Patent carotid termini. Normal MCA and ACA origins. Anterior communicating artery and bilateral ACA branches are normal aside from tortuosity. Left MCA M1 segment, left MCA trifurcation, and left MCA branches are patent. There is mild left MCA branch irregularity. Right MCA M1 segment, right MCA trifurcation, and right MCA branches are patent. There is mild right MCA branch irregularity. Venous sinuses: Not opacified on these early contrast phase images. Anatomic variants: None. Review of the MIP images confirms the above findings IMPRESSION: 1. Negative for emergent large vessel occlusion or significant large vessel stenosis. 2. But positive for dolichoectatic distal cervical ICAs (up to 9 mm diameter), and at the C1-C2 level there is a linear filling defect within the right ICA. This lesion more resembles a small focal dissection rather than adherent thrombus within the vessel. Perhaps the underlying dolichoectasia reflects an atypical appearance of FMD. 3. No intracranial arterial occlusion identified. Widespread irregularity of the bilateral distal PCAs. And more mild irregularity of distal anterior circulation  branches. 4. Study discussed by telephone with Dr. Roland Rack on 01/16/2017 at 16:11 . Electronically Signed   By: Genevie Ann M.D.   On: 01/16/2017 16:12   Ct Angio Neck W Or Wo Contrast  Result Date: 01/16/2017 CLINICAL DATA:  83 year old male with aphasia, slurred speech. EXAM: CT ANGIOGRAPHY HEAD AND NECK TECHNIQUE: Multidetector CT imaging of the head and neck was performed using the standard protocol during bolus administration of intravenous contrast. Multiplanar CT image reconstructions and MIPs were obtained to evaluate the vascular anatomy. Carotid stenosis measurements (when applicable) are obtained utilizing NASCET criteria, using the distal internal carotid diameter as the denominator.  CONTRAST:  50 mL Isovue 370 COMPARISON:  Head CT without contrast 1546 hours today. FINDINGS: CTA NECK Skeleton: Heterogeneous bone mineralization throughout the cervical spine is probably degenerative in nature. No acute osseous abnormality identified. Upper chest: Negative lung apices and visible superior mediastinum. Other neck: Negative.  No cervical lymphadenopathy. Aortic arch: 3 vessel arch configuration. Mild for age soft and calcified arch atherosclerosis. Right carotid system: Negative right CCA origin. Calcified plaque at the right ICA origin and distal bulb, but no stenosis. Tortuous cervical right ICA, and at the C1-C2 level the vessel it is dolichoectatic measuring up to 9 mm diameter. There is a linear defect through the central aspect of the vessel there (series 5, image 85 and series 9, image 76) which more resembles a short segment dissection than adherent clot. The vessel is otherwise widely patent to the skullbase. Left carotid system: No left CCA stenosis despite mild soft and calcified plaque. Widely patent left carotid bifurcation. Just distal to the bulb the vessel has a fairly normal caliber, but below the skullbase the cervical left ICA is dolichoectatic similar to the opposite right side, measuring 7-8 mm diameter. No beading of the vessel or intraluminal filling defect. Vertebral arteries:No proximal subclavian artery stenosis despite soft and calcified plaque on the left. Normal left vertebral artery origin. Calcified plaque at the right vertebral artery origin with only mild stenosis. Tortuous fairly codominant vertebral arteries are otherwise patent to the skullbase without stenosis. CTA HEAD Posterior circulation: Codominant distal vertebral arteries with patent PICA origins. Mild distal vertebral artery irregularity without stenosis. Tortuous vertebrobasilar junction. Mild basilar artery irregularity without stenosis. Normal SCA and left PCA origins. Fetal type right PCA. Left  posterior communicating artery diminutive or absent. The left PCA is somewhat diminutive compared to the right, but is patent with no proximal stenosis. There is bilateral moderate distal PCA branch irregularity and stenosis which appears fairly symmetric (series 10, image 21). Anterior circulation: Both ICA siphons are patent and mildly dolichoectatic. There is distal cavernous and supraclinoid segment calcified plaque bilaterally. However this does not appear to be hemodynamically significant. Ophthalmic and right posterior communicating artery origins are normal. Patent carotid termini. Normal MCA and ACA origins. Anterior communicating artery and bilateral ACA branches are normal aside from tortuosity. Left MCA M1 segment, left MCA trifurcation, and left MCA branches are patent. There is mild left MCA branch irregularity. Right MCA M1 segment, right MCA trifurcation, and right MCA branches are patent. There is mild right MCA branch irregularity. Venous sinuses: Not opacified on these early contrast phase images. Anatomic variants: None. Review of the MIP images confirms the above findings IMPRESSION: 1. Negative for emergent large vessel occlusion or significant large vessel stenosis. 2. But positive for dolichoectatic distal cervical ICAs (up to 9 mm diameter), and at the C1-C2 level there is a linear filling defect within  the right ICA. This lesion more resembles a small focal dissection rather than adherent thrombus within the vessel. Perhaps the underlying dolichoectasia reflects an atypical appearance of FMD. 3. No intracranial arterial occlusion identified. Widespread irregularity of the bilateral distal PCAs. And more mild irregularity of distal anterior circulation branches. 4. Study discussed by telephone with Dr. Roland Rack on 01/16/2017 at 16:11 . Electronically Signed   By: Genevie Ann M.D.   On: 01/16/2017 16:12   Ct Head Code Stroke W/o Cm  Result Date: 01/16/2017 CLINICAL DATA:  Code  stroke. 83 year old male, not otherwise specified. EXAM: CT HEAD WITHOUT CONTRAST TECHNIQUE: Contiguous axial images were obtained from the base of the skull through the vertex without intravenous contrast. COMPARISON:  None. FINDINGS: Brain: No midline shift, mass effect, or evidence of intracranial mass lesion. No acute intracranial hemorrhage identified. No ventriculomegaly. Patchy white matter hypodensity, most pronounced at the left anterior deep white matter capsules. No cortical encephalomalacia identified. Subtle asymmetric hypodensity in the left occipital pole. No other changes of acute cortically based infarct identified. Vascular: Calcified atherosclerosis at the skull base. No suspicious intracranial vascular hyperdensity. Skull: Osteopenia. No acute osseous abnormality identified. Sinuses/Orbits:  Clear aside from left mastoid sclerosis. Other: No acute orbit or scalp soft tissue finding. ASPECTS [facility] Stroke Program Early CT Score) - Ganglionic level infarction (caudate, lentiform nuclei, internal capsule, insula, M1-M3 cortex): 7 - Supraganglionic infarction (M4-M6 cortex): 3 Total score (0-10 with 10 being normal): 10 IMPRESSION: 1. Possible left PCA infarct with subtle hypodensity in the left occipital pole. No associated hemorrhage or mass effect. 2. No other CT evidence of acute cortically based infarct, with ASPECTS of 10. 3. The above was relayed via text pager to Dr. Roland Rack on 01/16/2017 at 15:49 . Electronically Signed   By: Genevie Ann M.D.   On: 01/16/2017 15:50     Kathrene Alu, MD 01/16/2017, 9:14 PM PGY-1, [facility] pager: [phone], text pages welcome

## 2017-01-17 ENCOUNTER — Observation Stay (HOSPITAL_BASED_OUTPATIENT_CLINIC_OR_DEPARTMENT_OTHER): Payer: PPO

## 2017-01-17 DIAGNOSIS — I351 Nonrheumatic aortic (valve) insufficiency: Secondary | ICD-10-CM

## 2017-01-17 DIAGNOSIS — I10 Essential (primary) hypertension: Secondary | ICD-10-CM | POA: Diagnosis not present

## 2017-01-17 DIAGNOSIS — E1159 Type 2 diabetes mellitus with other circulatory complications: Secondary | ICD-10-CM | POA: Diagnosis not present

## 2017-01-17 DIAGNOSIS — R4701 Aphasia: Secondary | ICD-10-CM | POA: Diagnosis not present

## 2017-01-17 DIAGNOSIS — E785 Hyperlipidemia, unspecified: Secondary | ICD-10-CM

## 2017-01-17 DIAGNOSIS — I6330 Cerebral infarction due to thrombosis of unspecified cerebral artery: Secondary | ICD-10-CM | POA: Insufficient documentation

## 2017-01-17 LAB — ECHOCARDIOGRAM COMPLETE
Ao-asc: 38 cm
Area-P 1/2: 3.06 cm2
E decel time: 246 msec
E/e' ratio: 10.49
FS: 29 % (ref 28–44)
Height: 64 in
IVS/LV PW RATIO, ED: 1.71
LA ID, A-P, ES: 31 mm
LA diam end sys: 31 mm
LA diam index: 1.89 cm/m2
LA vol A4C: 16.1 ml
LA vol index: 12.1 mL/m2
LA vol: 19.8 mL
LV E/e' medial: 10.49
LV E/e'average: 10.49
LV PW d: 7 mm — AB (ref 0.6–1.1)
LV e' LATERAL: 6.67 cm/s
LVOT area: 2.84 cm2
LVOT diameter: 19 mm
Lateral S' vel: 8.83 cm/s
MV Dec: 246
MV pk A vel: 101 m/s
MV pk E vel: 70 m/s
P 1/2 time: 72 ms
PV Reg grad dias: 5 mmHg
PV Reg vel dias: 117 cm/s
TAPSE: 26.3 mm
TDI e' lateral: 6.67
TDI e' medial: 6.2
Weight: 2089.6 oz

## 2017-01-17 LAB — GLUCOSE, CAPILLARY
Glucose-Capillary: 123 mg/dL — ABNORMAL HIGH (ref 65–99)
Glucose-Capillary: 126 mg/dL — ABNORMAL HIGH (ref 65–99)
Glucose-Capillary: 136 mg/dL — ABNORMAL HIGH (ref 65–99)
Glucose-Capillary: 186 mg/dL — ABNORMAL HIGH (ref 65–99)
Glucose-Capillary: 187 mg/dL — ABNORMAL HIGH (ref 65–99)

## 2017-01-17 LAB — CBC
HCT: 40.9 % (ref 39.0–52.0)
Hemoglobin: 14.5 g/dL (ref 13.0–17.0)
MCH: 32.6 pg (ref 26.0–34.0)
MCHC: 35.5 g/dL (ref 30.0–36.0)
MCV: 91.9 fL (ref 78.0–100.0)
Platelets: 142 10*3/uL — ABNORMAL LOW (ref 150–400)
RBC: 4.45 MIL/uL (ref 4.22–5.81)
RDW: 12.7 % (ref 11.5–15.5)
WBC: 5.8 10*3/uL (ref 4.0–10.5)

## 2017-01-17 LAB — BASIC METABOLIC PANEL
Anion gap: 9 (ref 5–15)
BUN: 17 mg/dL (ref 6–20)
CO2: 23 mmol/L (ref 22–32)
Calcium: 9.4 mg/dL (ref 8.9–10.3)
Chloride: 109 mmol/L (ref 101–111)
Creatinine, Ser: 0.99 mg/dL (ref 0.61–1.24)
GFR calc Af Amer: >60 mL/min (ref >60)
GFR calc non Af Amer: >60 mL/min (ref >60)
Glucose, Bld: 128 mg/dL — ABNORMAL HIGH (ref 65–99)
Potassium: 4 mmol/L (ref 3.5–5.1)
Sodium: 141 mmol/L (ref 135–145)

## 2017-01-17 LAB — LIPID PANEL
Cholesterol: 105 mg/dL (ref 0–200)
HDL: 40 mg/dL — ABNORMAL LOW (ref >40)
LDL Cholesterol: 55 mg/dL (ref 0–99)
Total CHOL/HDL Ratio: 2.6 RATIO
Triglycerides: 48 mg/dL (ref <150)
VLDL: 10 mg/dL (ref 0–40)

## 2017-01-17 MED ORDER — ASPIRIN 325 MG PO TABS: 325.0000 mg | ORAL_TABLET | Freq: Every day | ORAL | 0 refills | Status: DC

## 2017-01-17 NOTE — Discharge Summary (Signed)
[facility] Discharge Summary  Patient name: [name] [name] Medical record number: [id number] Date of birth: Oct 09, 1932 Age: 83 y.o. Gender: male Date of Admission: 01/16/2017  Date of Discharge: 01/17/2017  Admitting Physician: Zena Bing, DO  Primary Care Provider: Coletta Memos, PA-C Consultants: Neurology   Indication for Hospitalization: expressive aphasia   Discharge Diagnoses/Problem List:  Expressive aphasia Type 2 DM HLD Hypothyroidism Restless leg syndrome  Abdominal aortic aneurysm   Disposition: to home   Discharge Condition: stable, improved   Discharge Exam:  General: awake and alert, sitting in bed Cardiovascular: RRR, no MRG Respiratory: CTAB, no wheezes, rales, or rhonchi  Abdomen: soft, non tender, non distended, bowel sounds x4 Extremities: no edema, full range of motion, no tenderness   Brief Hospital Course:  Sula Soda. Quinnell is an 83 y.o. Male presenting with expressive aphasia. PMH is significant for Type II DM, HLD, hypothyroidism, restless leg syndrome, AAA. Patient's symptoms of expressive aphasia resolved upon arrival to ED. Patient's speech was clear and fluent on admission. CT head showed possible left PCA infarct, but CTA negative for arterial occlusion, some tortuous vessels. Neurology recommended stroke work-up. MRI showing punctate focus of acute ischemia in left cerebellar hemisphere, no hemorrhage or mass effect, and chronic microvascular ischemia. Echo showing LV EF 60-65%, no cardiac source of emboli identified. Neurology was consulted and per neurology recommendations, patient was advised to have 30 day cardiac event monitoring as outpatient. Cards master was consulted to set up monitor and follow up appointment. Per neurology recommendations patient was continued on aspirin 325 mg daily and cleared for discharge. Patient was discontinued from glimepiride due to episodes of hypoglycemia prior to  admission, and was recommended to have outpatient follow up with PCP to discuss diabetes treatment. Speech therapy, OT, and PT have all signed off and cleared patient for discharge without outpatient rehab needed. Patient was back to baseline on discharge.   Issues for Follow Up:  1. Follow up with PCP for hospital follow up 2. Follow up with cardiology for 30 day event monitor  3. Follow up with neurology in 6 weeks.  4. Have discontinued glimepiride due to episodes of hypoglycemia, will need outpatient follow up for other diabetic treatment options   Significant Procedures: echocardiogram   Significant Labs and Imaging:   Recent Labs Lab 01/16/17 1536 01/16/17 1544 01/17/17 0733  WBC 6.0  --  5.8  HGB 13.4 13.3 14.5  HCT 38.6* 39.0 40.9  PLT 138*  --  142*    Recent Labs Lab 01/16/17 1536 01/16/17 1544 01/17/17 0733  NA 136 139 141  K 4.2 4.2 4.0  CL 104 101 109  CO2 24  --  23  GLUCOSE 119* 120* 128*  BUN 21* 24* 17  CREATININE 1.11 1.10 0.99  CALCIUM 9.4  --  9.4  ALKPHOS 58  --   --   AST 23  --   --   ALT 18  --   --   ALBUMIN 3.6  --   --     Ct Angio Head W Or Wo Contrast  Result Date: 01/16/2017 CLINICAL DATA:  83 year old male with aphasia, slurred speech. EXAM: CT ANGIOGRAPHY HEAD AND NECK TECHNIQUE: Multidetector CT imaging of the head and neck was performed using the standard protocol during bolus administration of intravenous contrast. Multiplanar CT image reconstructions and MIPs were obtained to evaluate the vascular anatomy. Carotid stenosis measurements (when applicable) are obtained utilizing NASCET criteria, using the distal  internal carotid diameter as the denominator. CONTRAST:  50 mL Isovue 370 COMPARISON:  Head CT without contrast 1546 hours today. FINDINGS: CTA NECK Skeleton: Heterogeneous bone mineralization throughout the cervical spine is probably degenerative in nature. No acute osseous abnormality identified. Upper chest: Negative lung apices  and visible superior mediastinum. Other neck: Negative.  No cervical lymphadenopathy. Aortic arch: 3 vessel arch configuration. Mild for age soft and calcified arch atherosclerosis. Right carotid system: Negative right CCA origin. Calcified plaque at the right ICA origin and distal bulb, but no stenosis. Tortuous cervical right ICA, and at the C1-C2 level the vessel it is dolichoectatic measuring up to 9 mm diameter. There is a linear defect through the central aspect of the vessel there (series 5, image 85 and series 9, image 76) which more resembles a short segment dissection than adherent clot. The vessel is otherwise widely patent to the skullbase. Left carotid system: No left CCA stenosis despite mild soft and calcified plaque. Widely patent left carotid bifurcation. Just distal to the bulb the vessel has a fairly normal caliber, but below the skullbase the cervical left ICA is dolichoectatic similar to the opposite right side, measuring 7-8 mm diameter. No beading of the vessel or intraluminal filling defect. Vertebral arteries:No proximal subclavian artery stenosis despite soft and calcified plaque on the left. Normal left vertebral artery origin. Calcified plaque at the right vertebral artery origin with only mild stenosis. Tortuous fairly codominant vertebral arteries are otherwise patent to the skullbase without stenosis. CTA HEAD Posterior circulation: Codominant distal vertebral arteries with patent PICA origins. Mild distal vertebral artery irregularity without stenosis. Tortuous vertebrobasilar junction. Mild basilar artery irregularity without stenosis. Normal SCA and left PCA origins. Fetal type right PCA. Left posterior communicating artery diminutive or absent. The left PCA is somewhat diminutive compared to the right, but is patent with no proximal stenosis. There is bilateral moderate distal PCA branch irregularity and stenosis which appears fairly symmetric (series 10, image 21). Anterior  circulation: Both ICA siphons are patent and mildly dolichoectatic. There is distal cavernous and supraclinoid segment calcified plaque bilaterally. However this does not appear to be hemodynamically significant. Ophthalmic and right posterior communicating artery origins are normal. Patent carotid termini. Normal MCA and ACA origins. Anterior communicating artery and bilateral ACA branches are normal aside from tortuosity. Left MCA M1 segment, left MCA trifurcation, and left MCA branches are patent. There is mild left MCA branch irregularity. Right MCA M1 segment, right MCA trifurcation, and right MCA branches are patent. There is mild right MCA branch irregularity. Venous sinuses: Not opacified on these early contrast phase images. Anatomic variants: None. Review of the MIP images confirms the above findings IMPRESSION: 1. Negative for emergent large vessel occlusion or significant large vessel stenosis. 2. But positive for dolichoectatic distal cervical ICAs (up to 9 mm diameter), and at the C1-C2 level there is a linear filling defect within the right ICA. This lesion more resembles a small focal dissection rather than adherent thrombus within the vessel. Perhaps the underlying dolichoectasia reflects an atypical appearance of FMD. 3. No intracranial arterial occlusion identified. Widespread irregularity of the bilateral distal PCAs. And more mild irregularity of distal anterior circulation branches. 4. Study discussed by telephone with Dr. Roland Rack on 01/16/2017 at 16:11 . Electronically Signed   By: Genevie Ann M.D.   On: 01/16/2017 16:12   Ct Angio Neck W Or Wo Contrast  Result Date: 01/16/2017 CLINICAL DATA:  83 year old male with aphasia, slurred speech. EXAM: CT ANGIOGRAPHY HEAD  AND NECK TECHNIQUE: Multidetector CT imaging of the head and neck was performed using the standard protocol during bolus administration of intravenous contrast. Multiplanar CT image reconstructions and MIPs were obtained  to evaluate the vascular anatomy. Carotid stenosis measurements (when applicable) are obtained utilizing NASCET criteria, using the distal internal carotid diameter as the denominator. CONTRAST:  50 mL Isovue 370 COMPARISON:  Head CT without contrast 1546 hours today. FINDINGS: CTA NECK Skeleton: Heterogeneous bone mineralization throughout the cervical spine is probably degenerative in nature. No acute osseous abnormality identified. Upper chest: Negative lung apices and visible superior mediastinum. Other neck: Negative.  No cervical lymphadenopathy. Aortic arch: 3 vessel arch configuration. Mild for age soft and calcified arch atherosclerosis. Right carotid system: Negative right CCA origin. Calcified plaque at the right ICA origin and distal bulb, but no stenosis. Tortuous cervical right ICA, and at the C1-C2 level the vessel it is dolichoectatic measuring up to 9 mm diameter. There is a linear defect through the central aspect of the vessel there (series 5, image 85 and series 9, image 76) which more resembles a short segment dissection than adherent clot. The vessel is otherwise widely patent to the skullbase. Left carotid system: No left CCA stenosis despite mild soft and calcified plaque. Widely patent left carotid bifurcation. Just distal to the bulb the vessel has a fairly normal caliber, but below the skullbase the cervical left ICA is dolichoectatic similar to the opposite right side, measuring 7-8 mm diameter. No beading of the vessel or intraluminal filling defect. Vertebral arteries:No proximal subclavian artery stenosis despite soft and calcified plaque on the left. Normal left vertebral artery origin. Calcified plaque at the right vertebral artery origin with only mild stenosis. Tortuous fairly codominant vertebral arteries are otherwise patent to the skullbase without stenosis. CTA HEAD Posterior circulation: Codominant distal vertebral arteries with patent PICA origins. Mild distal vertebral  artery irregularity without stenosis. Tortuous vertebrobasilar junction. Mild basilar artery irregularity without stenosis. Normal SCA and left PCA origins. Fetal type right PCA. Left posterior communicating artery diminutive or absent. The left PCA is somewhat diminutive compared to the right, but is patent with no proximal stenosis. There is bilateral moderate distal PCA branch irregularity and stenosis which appears fairly symmetric (series 10, image 21). Anterior circulation: Both ICA siphons are patent and mildly dolichoectatic. There is distal cavernous and supraclinoid segment calcified plaque bilaterally. However this does not appear to be hemodynamically significant. Ophthalmic and right posterior communicating artery origins are normal. Patent carotid termini. Normal MCA and ACA origins. Anterior communicating artery and bilateral ACA branches are normal aside from tortuosity. Left MCA M1 segment, left MCA trifurcation, and left MCA branches are patent. There is mild left MCA branch irregularity. Right MCA M1 segment, right MCA trifurcation, and right MCA branches are patent. There is mild right MCA branch irregularity. Venous sinuses: Not opacified on these early contrast phase images. Anatomic variants: None. Review of the MIP images confirms the above findings IMPRESSION: 1. Negative for emergent large vessel occlusion or significant large vessel stenosis. 2. But positive for dolichoectatic distal cervical ICAs (up to 9 mm diameter), and at the C1-C2 level there is a linear filling defect within the right ICA. This lesion more resembles a small focal dissection rather than adherent thrombus within the vessel. Perhaps the underlying dolichoectasia reflects an atypical appearance of FMD. 3. No intracranial arterial occlusion identified. Widespread irregularity of the bilateral distal PCAs. And more mild irregularity of distal anterior circulation branches. 4. Study discussed by telephone  with Dr. Roland Rack on 01/16/2017 at 16:11 . Electronically Signed   By: Genevie Ann M.D.   On: 01/16/2017 16:12   Mr Brain Wo Contrast  Result Date: 01/17/2017 CLINICAL DATA:  Expressive aphasia EXAM: MRI HEAD WITHOUT CONTRAST TECHNIQUE: Multiplanar, multiecho pulse sequences of the brain and surrounding structures were obtained without intravenous contrast. COMPARISON:  CTA head neck 01/16/2017 FINDINGS: Brain: The midline structures are normal. There is a punctate focus of diffusion restriction in the left cerebellar hemisphere. There is multifocal hyperintense T2-weighted signal within the periventricular white matter, most often seen in the setting of chronic microvascular ischemia. No intraparenchymal hematoma or chronic microhemorrhage. Brain volume is normal for age without age-advanced or lobar predominant atrophy. The dura is normal and there is no extra-axial collection. There is a posterior fossa arachnoid cyst versus mega cisterna magna. Vascular: Major intracranial arterial and venous sinus flow voids are preserved. Skull and upper cervical spine: The visualized skull base, calvarium, upper cervical spine and extracranial soft tissues are normal. Sinuses/Orbits: No fluid levels or advanced mucosal thickening. No mastoid effusion. Normal orbits. IMPRESSION: 1. Punctate focus of acute ischemia in the left cerebellar hemisphere. No hemorrhage or mass effect. 2. Chronic microvascular ischemia. Electronically Signed   By: Ulyses Jarred M.D.   On: 01/17/2017 02:50   Ct Head Code Stroke W/o Cm  Result Date: 01/16/2017 CLINICAL DATA:  Code stroke. 83 year old male, not otherwise specified. EXAM: CT HEAD WITHOUT CONTRAST TECHNIQUE: Contiguous axial images were obtained from the base of the skull through the vertex without intravenous contrast. COMPARISON:  None. FINDINGS: Brain: No midline shift, mass effect, or evidence of intracranial mass lesion. No acute intracranial hemorrhage identified. No ventriculomegaly.  Patchy white matter hypodensity, most pronounced at the left anterior deep white matter capsules. No cortical encephalomalacia identified. Subtle asymmetric hypodensity in the left occipital pole. No other changes of acute cortically based infarct identified. Vascular: Calcified atherosclerosis at the skull base. No suspicious intracranial vascular hyperdensity. Skull: Osteopenia. No acute osseous abnormality identified. Sinuses/Orbits:  Clear aside from left mastoid sclerosis. Other: No acute orbit or scalp soft tissue finding. ASPECTS [facility] Stroke Program Early CT Score) - Ganglionic level infarction (caudate, lentiform nuclei, internal capsule, insula, M1-M3 cortex): 7 - Supraganglionic infarction (M4-M6 cortex): 3 Total score (0-10 with 10 being normal): 10 IMPRESSION: 1. Possible left PCA infarct with subtle hypodensity in the left occipital pole. No associated hemorrhage or mass effect. 2. No other CT evidence of acute cortically based infarct, with ASPECTS of 10. 3. The above was relayed via text pager to Dr. Roland Rack on 01/16/2017 at 15:49 . Electronically Signed   By: Genevie Ann M.D.   On: 01/16/2017 15:50    Results/Tests Pending at Time of Discharge: A1C,   Discharge Medications:  Allergies as of 01/17/2017      Reactions   Actos [pioglitazone] Swelling   Allegra [fexofenadine] Other (See Comments)   stimulant   Iodine Swelling   Metformin And Related Diarrhea   Pt states that he has a reaction to the extended release metformin but takes the regular   Onglyza [saxagliptin] Other (See Comments)   Weight loss   Shellfish Allergy Swelling      Medication List    STOP taking these medications   glimepiride 1 MG tablet Commonly known as:  AMARYL     TAKE these medications   aspirin 325 MG tablet Take 1 tablet (325 mg total) by mouth daily. What changed:  medication strength  how much to take  when to take this   cholecalciferol 1000 units tablet Commonly known as:   VITAMIN D Take 1,000 Units by mouth daily with supper.   CoQ10 100 MG Caps Take 100 mg by mouth daily.   diazepam 5 MG tablet Commonly known as:  VALIUM Take 2.5-5 mg by mouth at bedtime as needed for anxiety (restless legs).   levothyroxine 75 MCG tablet Commonly known as:  SYNTHROID, LEVOTHROID Take 75 mcg by mouth daily before breakfast.   loratadine 10 MG tablet Commonly known as:  CLARITIN Take 10 mg by mouth daily.   metFORMIN 1000 MG tablet Commonly known as:  GLUCOPHAGE Take 1,000 mg by mouth 2 (two) times daily.   multivitamin with minerals Tabs tablet Take 1 tablet by mouth daily.   Omega-3 1000 MG Caps Take 1,000 mg by mouth daily.   omeprazole 20 MG capsule Commonly known as:  PRILOSEC Take 20 mg by mouth at bedtime.   rosuvastatin 10 MG tablet Commonly known as:  CRESTOR Take 10 mg by mouth at bedtime.   sildenafil 20 MG tablet Commonly known as:  REVATIO Take 40-100 mg by mouth as needed. For sexual activity   vitamin B-12 1000 MCG tablet Commonly known as:  CYANOCOBALAMIN Take 1,000 mcg by mouth daily.       Discharge Instructions: Please refer to Patient Instructions section of EMR for full details.  Patient was counseled important signs and symptoms that should prompt return to medical care, changes in medications, dietary instructions, activity restrictions, and follow up appointments.   Follow-Up Appointments: Follow-up Information    Coletta Memos, PA-C. Schedule an appointment as soon as possible for a visit in 1 week(s).   Specialty:  General Practice Contact information: [place] [address] [phone]        Sueanne Margarita, MD. Call in 1 week(s).   Specialty:  Cardiology Contact information: [address] [place] [address] [phone]        Dennie Bible, NP. Schedule an appointment as soon as possible for a visit in 6 week(s).   Specialty:  Family Medicine Contact  information: [address] [place] [address] [place], [place], [place] 01/18/2017, 2:15 PM PGY-1, [place]

## 2017-01-17 NOTE — Evaluation (Signed)
Speech Language Pathology Evaluation Patient Details Name: [name] [name] MRN: [id number] DOB: 17-Nov-1932 Today's Date: 01/17/2017 Time: 4562-5638 SLP Time Calculation (min) (ACUTE ONLY): 11 min  Problem List:  Patient Active Problem List   Diagnosis Date Noted  . Cerebral thrombosis with cerebral infarction 01/17/2017  . Expressive aphasia 01/16/2017  . Diabetes mellitus ([place]) 01/16/2017  . 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 Aftercare following surgery of the circulatory system, [place] 12/09/2013  . Abdominal aneurysm without mention of rupture 12/09/2013  . PAD (peripheral artery disease) ([place]) 04/14/2013   Past Medical History:  Past Medical History:  Diagnosis Date  . AAA (abdominal aortic aneurysm) ([place])   . 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 Diabetes mellitus without complication ([place])   . Hiatal hernia   . Hyperlipidemia    Past Surgical History:  Past Surgical History:  Procedure Laterality Date  . ABDOMINAL AORTIC ANEURYSM REPAIR  10-2003   also had iliac aneurysm repair  . CHOLECYSTECTOMY     Laparoscopic cholecystectomy  . EYE SURGERY Bilateral    cataracts   HPI:  Pt is an 83 y/o male admitted secondary to expressive aphasia. MRI revealed punctate focus of acute ischemia in the L cerebellar hemisphere with no hemorrhage or mass effect. PMH including but not limited to DM, HLD and AAA   Assessment / Plan / Recommendation Clinical Impression   Pts cognitive linguistic abilities appear at baseline. Pts previously described, expressive aphasia like symptoms have completely resolved, per pt and family member report. Both expressive and receptive language abilities appear intact. Pt reports baseline mild recall deficits which he states do not impede his daily living tasks. Family present who deny acute cognitive changes. PLOF pt lives with spouse and is independent with most ADLs: spouse cares for medicines and finances. No further ST needs identified.      SLP  Assessment  SLP Recommendation/Assessment: Patient does not need any further Speech Lanaguage Pathology Services    Follow Up Recommendations    None   Frequency and Duration           SLP Evaluation Cognition  Overall Cognitive Status: Within Functional Limits for tasks assessed Arousal/Alertness: Awake/alert Orientation Level: Oriented to person;Oriented to place;Oriented to situation (disoriented to day of the month ) Memory:  (reports baseline recall deficits ) Awareness: Appears intact Safety/Judgment: Appears intact       Comprehension  Auditory Comprehension Overall Auditory Comprehension: Appears within functional limits for tasks assessed Visual Recognition/Discrimination Discrimination: Within Function Limits    Expression Expression Primary Mode of Expression: Verbal Verbal Expression Overall Verbal Expression: Appears within functional limits for tasks assessed Written Expression Dominant Hand: Right Written Expression: Within Functional Limits   Oral / Motor  Oral Motor/Sensory Function Overall Oral Motor/Sensory Function: Within functional limits Motor Speech Overall Motor Speech: Appears within functional limits for tasks assessed   GO          Functional Assessment Tool Used: skilled observation Functional Limitations: Spoken language expressive Spoken Language Expression Current Status (L3734): 0 percent impaired, limited or restricted Spoken Language Expression Goal Status (K8768): 0 percent impaired, limited or restricted Spoken Language Expression Discharge Status [phone]): 0 percent impaired, limited or restricted         Arvil Chaco MA, CCC-SLP Acute Care Speech Language Pathologist    Levi Aland 01/17/2017, 3:58 PM

## 2017-01-17 NOTE — Evaluation (Signed)
Physical Therapy Evaluation Patient Details Name: [name] [name] MRN: [id number] DOB: 1932-08-19 Today's Date: 01/17/2017   History of Present Illness  Pt is an 83 y/o male admitted secondary to expressive aphasia. MRI revealed punctate focus of acute ischemia in the L cerebellar hemisphere with no hemorrhage or mass effect. PMH including but not limited to DM, HLD and AAA.  Clinical Impression  Pt presented exiting bathroom, awake and willing to participate in therapy session. Pt's wife present throughout as well. Prior to admission, pt reported that he was independent with all functional mobility and ADLs. Pt reported that he continues to drive as well. Pt ambulated in room without an AD with no concerns. No sensation or gross strength impairments in bilateral LEs. PT evaluation limited secondary to MD entering room for assessment; however, no concerns or issues regarding pt's mobility at this time. PT signing off.     Follow Up Recommendations No PT follow up    Equipment Recommendations  None recommended by PT    Recommendations for Other Services       Precautions / Restrictions Restrictions Weight Bearing Restrictions: No      Mobility  Bed Mobility Overal bed mobility: Modified Independent                Transfers Overall transfer level: Independent Equipment used: None                Ambulation/Gait Ambulation/Gait assistance: Modified independent (Device/Increase time) Ambulation Distance (Feet): 40 Feet (within his room) Assistive device: None Gait Pattern/deviations: Step-through pattern;Decreased stride length Gait velocity: decreased   General Gait Details: no instability or LOB noted, pt ambulating in room with no issues; distance ambulated limited secondary to MD entering room for assessment  Stairs            Wheelchair Mobility    Modified Rankin (Stroke Patients Only) Modified Rankin (Stroke Patients Only) Pre-Morbid Rankin  Score: No symptoms Modified Rankin: No significant disability     Balance Overall balance assessment: Needs assistance Sitting-balance support: Feet supported Sitting balance-Leahy Scale: Normal     Standing balance support: During functional activity;No upper extremity supported Standing balance-Leahy Scale: Good                               Pertinent Vitals/Pain Pain Assessment: No/denies pain    Home Living Family/patient expects to be discharged to:: Private residence Living Arrangements: Spouse/significant other Available Help at Discharge: Family;Available 24 hours/day Type of Home: House Home Access: Level entry     Home Layout: One level Home Equipment: Cane - single point      Prior Function Level of Independence: Independent         Comments: still drives     Hand Dominance   Dominant Hand: Right    Extremity/Trunk Assessment   Upper Extremity Assessment Upper Extremity Assessment: Overall WFL for tasks assessed    Lower Extremity Assessment Lower Extremity Assessment: Overall WFL for tasks assessed       Communication   Communication: No difficulties  Cognition Arousal/Alertness: Awake/alert Behavior During Therapy: WFL for tasks assessed/performed Overall Cognitive Status: Within Functional Limits for tasks assessed                                        General Comments      Exercises  Assessment/Plan    PT Assessment Patent does not need any further PT services  PT Problem List         PT Treatment Interventions      PT Goals (Current goals can be found in the Care Plan section)  Acute Rehab PT Goals Patient Stated Goal: return home    Frequency     Barriers to discharge        Co-evaluation               AM-PAC PT "6 Clicks" Daily Activity  Outcome Measure Difficulty turning over in bed (including adjusting bedclothes, sheets and blankets)?: None Difficulty moving from  lying on back to sitting on the side of the bed? : None Difficulty sitting down on and standing up from a chair with arms (e.g., wheelchair, bedside commode, etc,.)?: None Help needed moving to and from a bed to chair (including a wheelchair)?: None Help needed walking in hospital room?: None Help needed climbing 3-5 steps with a railing? : A Little 6 Click Score: 23    End of Session   Activity Tolerance: Patient tolerated treatment well Patient left: in bed;with call bell/phone within reach;with family/visitor present;Other (comment) (MD in room; pt sitting EOB) Nurse Communication: Mobility status PT Visit Diagnosis: Other symptoms and signs involving the nervous system (R29.898)    Time: 2081-3887 PT Time Calculation (min) (ACUTE ONLY): 10 min   Charges:   PT Evaluation $PT Eval Low Complexity: 1 Procedure     PT G Codes:   PT G-Codes **NOT FOR INPATIENT CLASS** Functional Assessment Tool Used: AM-PAC 6 Clicks Basic Mobility;Clinical judgement Functional Limitation: Mobility: Walking and moving around Mobility: Walking and Moving Around Current Status (J9597): At least 1 percent but less than 20 percent impaired, limited or restricted Mobility: Walking and Moving Around Goal Status [phone]): 0 percent impaired, limited or restricted Mobility: Walking and Moving Around Discharge Status [phone]): At least 1 percent but less than 20 percent impaired, limited or restricted    [facility], [place], DPT [place] 01/17/2017, 10:40 AM

## 2017-01-17 NOTE — Discharge Instructions (Signed)
We have made two changes to your medications:  1. We have increased your dose of aspirin to 325 mg per day. (You were previously taking 81 mg per day.) I have prescribed 325 mg tablets for you that can be picked up at your pharmacy today.  2. Please STOP taking Amaryl (glimiperide) for diabetes, as you have had low blood sugars. Continue taking metformin as you have been.   Please schedule an appointment with your primary doctor within one week.  Please make sure you have an appointment with your cardiologist Dr. Radford Pax within the next two weeks.

## 2017-01-17 NOTE — Progress Notes (Signed)
  Echocardiogram 2D Echocardiogram has been performed.  Tiffany G Dance 01/17/2017, 10:33 AM

## 2017-01-17 NOTE — Progress Notes (Signed)
I spoke with neurology on the phone. They stated that patient was currently back at baseline and had transient deficit. Since MRI showed a stroke, recommended a 30 day cardio event monitor to be followed as outpatient with Dr. Radford Pax. Neurology has stated patient is cleared for discharge. Spoke to Water engineer on the phone and they stated that he will be set up with monitor and follow up as outpatient.   Dalphine Handing, PGY-1 [facility] 01/17/2017 3:08 PM

## 2017-01-17 NOTE — H&P (Signed)
[facility] Admission History and Physical Service Pager: [phone]  Patient name: [name] [name]          Medical record number: [id number] Date of birth: 01/03/1933          Age: 83 y.o.    Gender: male  Primary Care Provider: Coletta Memos, PA-C Consultants: Neurology Code Status: FULL  Chief Complaint: expressive aphasia  Assessment and Plan: WADSWORTH SKOLNICK is a 83 y.o. male presenting with expressive aphasia. PMH is significant for Type II DM, HLD, hypothyroidism, restless leg syndrome, AAA.    Expressive Aphasia: Resolved since arriving to ED. Speech clear and fluent on exam without signs of residual si/sx. DDx includes transient expressive aphasia, TIA, stroke, hypoglycemia.  Symptoms have resolved since the episode occurred at 1400 on 7/19, and no other neurological deficits are reported by the patient though daughter endorsed possible minimal L-facial droop.  Less likely hypoglycemia because patient's blood sugars are usually in the mid-100's, and this symptom is inconsistent with his usual hypoglycemic symptoms of diaphoresis and nausea.  CT head showed possible left PCA infarct, but CTA negative for arterial occlusion, some tortuous vessels. Neurology recommended stroke work-up and observation. No prior history of strokes or need for anticoagulation. -admit to telemetry, under attending Dr. Erin Hearing -HbA1C, fasting lipid panel -MRI brain w/o contrast -Echo -PT/OT/Speech consult -ASA 325 mg daily -neuro checks q2h -vitals per unit routine -NPO until passes stroke swallow screen  Type II DM: Unable to find last A1C, but patient carefully monitors blood sugars at home.  They usually range in the mid-100s and will sometimes go as low as 60, which will give him hypoglycemic symptoms.  Takes glimepiride 0.5 mg daily and metformin 1 g BID at home. -continue home glimepiride and metformin -CBGs q4h  HLD: Takes Crestor 10 mg daily at  home.  Will follow up on lipid panel. -continue home Crestor  Hypothyroidism: No TSH on file.  Takes synthroid 75 mcg daily -continue home Synthroid -TSH  Restless Leg Syndrome:  Takes valium PRN for this at home. -continue home Valium  Abdominal Aortic Aneurysm: Patient reports this is currently stable and was repaired in 2005. Repaired by Dr. Oneida Alar. Daughter states next CT due in 2020. Pt w/o si/sx of AAA or dissection. -CTA abdomen/pelvis    FEN/GI: NPO awaiting speech eval, protonix Prophylaxis: SCDs, ASA  Disposition: Home  History of Present Illness:  [name] [name] is a 83 y.o. male presenting with expressive aphasia.  The patient was driving with his wife this afternoon around 1400 and was unable to find his words during their conversation. They called their daughter, who is an FNP, and she instructed them to call 911.  Wife called EMS after he drove them back home, and he was brought to ED.  He improved verbally on the drive home. He denies focal weakness or feelings of lightheadedness or dizziness during episode.  Does endorse mild h/a on frontal region that feels like the chronic headaches he often gets. On Sinafil but not taken since Sunday; this is a new medication. Currently on aspirin 81 mg daily. No previous h/o similar episodes. No numbness, but wife noted minimal asymmetry of face with facial droop on left side. AAA in iliac region that was surgically repaired in 2005. Has DM on metformin and Amaryl, reported a hypoglycemic incident yesterday with symptoms of nausea and diaphoresis.  Review Of Systems: Per HPI with the following additions:  Otherwise the remainder of the systems  were negative.  Review of Systems  Constitutional: Negative for diaphoresis and malaise/fatigue.  Cardiovascular: Negative for chest pain.  Gastrointestinal: Negative for abdominal pain.  Neurological: Positive for speech change and headaches. Negative for focal weakness,  loss of consciousness and weakness.  Endo/Heme/Allergies: Positive for environmental allergies.        Patient Active Problem List   Diagnosis Date Noted  . Expressive aphasia 01/16/2017  . 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 Aftercare following surgery of the circulatory system, [place] 12/09/2013  . Abdominal aneurysm without mention of rupture 12/09/2013  . PAD (peripheral artery disease) ([place]) 04/14/2013    Past Medical History:     Past Medical History:  Diagnosis Date  . AAA (abdominal aortic aneurysm) ([place])   . 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 Diabetes mellitus without complication ([place])   . Hiatal hernia   . Hyperlipidemia     Past Surgical History:      Past Surgical History:  Procedure Laterality Date  . ABDOMINAL AORTIC ANEURYSM REPAIR  10-2003   also had iliac aneurysm repair  . CHOLECYSTECTOMY     Laparoscopic cholecystectomy  . EYE SURGERY Bilateral    cataracts    Social History:     Social History  Substance Use Topics  . Smoking status: Never Smoker  . Smokeless tobacco: Never Used  . Alcohol use No   Additional social history: never smoker, no etoh or illicit drugs  Please also refer to relevant sections of EMR.  Family History:      Family History  Problem Relation Age of Onset  . Arthritis Mother        Rheumatoid arthritis   Allergies and Medications:      Allergies  Allergen Reactions  . Actos [Pioglitazone] Swelling  . Allegra [Fexofenadine] Other (See Comments)    stimulant  . Iodine Swelling  . Metformin And Related Diarrhea    Pt states that he has a reaction to the extended release metformin but takes the regular  . Onglyza [Saxagliptin] Other (See Comments)    Weight loss  . Shellfish Allergy Swelling   No current facility-administered medications on file prior to encounter.          Current Outpatient Prescriptions on File Prior to Encounter  Medication Sig Dispense Refill  .  aspirin 81 MG tablet Take 81 mg by mouth at bedtime.     Marland Kitchen levothyroxine (SYNTHROID, LEVOTHROID) 75 MCG tablet Take 75 mcg by mouth daily before breakfast.     . loratadine (CLARITIN) 10 MG tablet Take 10 mg by mouth daily.    . metFORMIN (GLUCOPHAGE) 1000 MG tablet Take 1,000 mg by mouth 2 (two) times daily.    Marland Kitchen omeprazole (PRILOSEC) 20 MG capsule Take 20 mg by mouth at bedtime.     . rosuvastatin (CRESTOR) 10 MG tablet Take 10 mg by mouth at bedtime.       Objective: BP (!) 172/63   Pulse 74   Temp 98.7 F (37.1 C)   Resp 17   Ht 5\' 4"  (1.626 m)   Wt 135 lb (61.2 kg)   SpO2 99%   BMI 23.17 kg/m  Exam: General: pleasant, talkative man lying in bed in NAD Eyes: PERRL, EOMI ENTM: MMM, nares clear bilaterally Neck: no lymphadenopathy Cardiovascular: RRR, no MRG Respiratory: CTAB, no wheezing or crackles Abdomen: soft, nontender, nondistended, +bowel sounds MSK: 5/5 strength, full ROM in all extremities Skin: no ecchymosis Neuro: AAOx3, no focal deficits noted, CN II-XII  intact Psych: appropriate mood and affect  Labs and Imaging: CBC BMET   Last Labs    Recent Labs Lab 01/16/17 1536 01/16/17 1544  WBC 6.0  --   HGB 13.4 13.3  HCT 38.6* 39.0  PLT 138*  --       Last Labs    Recent Labs Lab 01/16/17 1536 01/16/17 1544  NA 136 139  K 4.2 4.2  CL 104 101  CO2 24  --   BUN 21* 24*  CREATININE 1.11 1.10  GLUCOSE 119* 120*  CALCIUM 9.4  --        Ct Angio Head W Or Wo Contrast  Result Date: 01/16/2017 CLINICAL DATA:  83 year old male with aphasia, slurred speech. EXAM: CT ANGIOGRAPHY HEAD AND NECK TECHNIQUE: Multidetector CT imaging of the head and neck was performed using the standard protocol during bolus administration of intravenous contrast. Multiplanar CT image reconstructions and MIPs were obtained to evaluate the vascular anatomy. Carotid stenosis measurements (when applicable) are obtained utilizing NASCET criteria, using the  distal internal carotid diameter as the denominator. CONTRAST:  50 mL Isovue 370 COMPARISON:  Head CT without contrast 1546 hours today. FINDINGS: CTA NECK Skeleton: Heterogeneous bone mineralization throughout the cervical spine is probably degenerative in nature. No acute osseous abnormality identified. Upper chest: Negative lung apices and visible superior mediastinum. Other neck: Negative.  No cervical lymphadenopathy. Aortic arch: 3 vessel arch configuration. Mild for age soft and calcified arch atherosclerosis. Right carotid system: Negative right CCA origin. Calcified plaque at the right ICA origin and distal bulb, but no stenosis. Tortuous cervical right ICA, and at the C1-C2 level the vessel it is dolichoectatic measuring up to 9 mm diameter. There is a linear defect through the central aspect of the vessel there (series 5, image 85 and series 9, image 76) which more resembles a short segment dissection than adherent clot. The vessel is otherwise widely patent to the skullbase. Left carotid system: No left CCA stenosis despite mild soft and calcified plaque. Widely patent left carotid bifurcation. Just distal to the bulb the vessel has a fairly normal caliber, but below the skullbase the cervical left ICA is dolichoectatic similar to the opposite right side, measuring 7-8 mm diameter. No beading of the vessel or intraluminal filling defect. Vertebral arteries:No proximal subclavian artery stenosis despite soft and calcified plaque on the left. Normal left vertebral artery origin. Calcified plaque at the right vertebral artery origin with only mild stenosis. Tortuous fairly codominant vertebral arteries are otherwise patent to the skullbase without stenosis. CTA HEAD Posterior circulation: Codominant distal vertebral arteries with patent PICA origins. Mild distal vertebral artery irregularity without stenosis. Tortuous vertebrobasilar junction. Mild basilar artery irregularity without stenosis. Normal SCA  and left PCA origins. Fetal type right PCA. Left posterior communicating artery diminutive or absent. The left PCA is somewhat diminutive compared to the right, but is patent with no proximal stenosis. There is bilateral moderate distal PCA branch irregularity and stenosis which appears fairly symmetric (series 10, image 21). Anterior circulation: Both ICA siphons are patent and mildly dolichoectatic. There is distal cavernous and supraclinoid segment calcified plaque bilaterally. However this does not appear to be hemodynamically significant. Ophthalmic and right posterior communicating artery origins are normal. Patent carotid termini. Normal MCA and ACA origins. Anterior communicating artery and bilateral ACA branches are normal aside from tortuosity. Left MCA M1 segment, left MCA trifurcation, and left MCA branches are patent. There is mild left MCA branch irregularity. Right MCA M1 segment, right MCA  trifurcation, and right MCA branches are patent. There is mild right MCA branch irregularity. Venous sinuses: Not opacified on these early contrast phase images. Anatomic variants: None. Review of the MIP images confirms the above findings IMPRESSION: 1. Negative for emergent large vessel occlusion or significant large vessel stenosis. 2. But positive for dolichoectatic distal cervical ICAs (up to 9 mm diameter), and at the C1-C2 level there is a linear filling defect within the right ICA. This lesion more resembles a small focal dissection rather than adherent thrombus within the vessel. Perhaps the underlying dolichoectasia reflects an atypical appearance of FMD. 3. No intracranial arterial occlusion identified. Widespread irregularity of the bilateral distal PCAs. And more mild irregularity of distal anterior circulation branches. 4. Study discussed by telephone with Dr. Roland Rack on 01/16/2017 at 16:11 . Electronically Signed   By: Genevie Ann M.D.   On: 01/16/2017 16:12   Ct Angio Neck W Or Wo  Contrast  Result Date: 01/16/2017 CLINICAL DATA:  83 year old male with aphasia, slurred speech. EXAM: CT ANGIOGRAPHY HEAD AND NECK TECHNIQUE: Multidetector CT imaging of the head and neck was performed using the standard protocol during bolus administration of intravenous contrast. Multiplanar CT image reconstructions and MIPs were obtained to evaluate the vascular anatomy. Carotid stenosis measurements (when applicable) are obtained utilizing NASCET criteria, using the distal internal carotid diameter as the denominator. CONTRAST:  50 mL Isovue 370 COMPARISON:  Head CT without contrast 1546 hours today. FINDINGS: CTA NECK Skeleton: Heterogeneous bone mineralization throughout the cervical spine is probably degenerative in nature. No acute osseous abnormality identified. Upper chest: Negative lung apices and visible superior mediastinum. Other neck: Negative.  No cervical lymphadenopathy. Aortic arch: 3 vessel arch configuration. Mild for age soft and calcified arch atherosclerosis. Right carotid system: Negative right CCA origin. Calcified plaque at the right ICA origin and distal bulb, but no stenosis. Tortuous cervical right ICA, and at the C1-C2 level the vessel it is dolichoectatic measuring up to 9 mm diameter. There is a linear defect through the central aspect of the vessel there (series 5, image 85 and series 9, image 76) which more resembles a short segment dissection than adherent clot. The vessel is otherwise widely patent to the skullbase. Left carotid system: No left CCA stenosis despite mild soft and calcified plaque. Widely patent left carotid bifurcation. Just distal to the bulb the vessel has a fairly normal caliber, but below the skullbase the cervical left ICA is dolichoectatic similar to the opposite right side, measuring 7-8 mm diameter. No beading of the vessel or intraluminal filling defect. Vertebral arteries:No proximal subclavian artery stenosis despite soft and calcified plaque on the  left. Normal left vertebral artery origin. Calcified plaque at the right vertebral artery origin with only mild stenosis. Tortuous fairly codominant vertebral arteries are otherwise patent to the skullbase without stenosis. CTA HEAD Posterior circulation: Codominant distal vertebral arteries with patent PICA origins. Mild distal vertebral artery irregularity without stenosis. Tortuous vertebrobasilar junction. Mild basilar artery irregularity without stenosis. Normal SCA and left PCA origins. Fetal type right PCA. Left posterior communicating artery diminutive or absent. The left PCA is somewhat diminutive compared to the right, but is patent with no proximal stenosis. There is bilateral moderate distal PCA branch irregularity and stenosis which appears fairly symmetric (series 10, image 21). Anterior circulation: Both ICA siphons are patent and mildly dolichoectatic. There is distal cavernous and supraclinoid segment calcified plaque bilaterally. However this does not appear to be hemodynamically significant. Ophthalmic and right posterior communicating artery  origins are normal. Patent carotid termini. Normal MCA and ACA origins. Anterior communicating artery and bilateral ACA branches are normal aside from tortuosity. Left MCA M1 segment, left MCA trifurcation, and left MCA branches are patent. There is mild left MCA branch irregularity. Right MCA M1 segment, right MCA trifurcation, and right MCA branches are patent. There is mild right MCA branch irregularity. Venous sinuses: Not opacified on these early contrast phase images. Anatomic variants: None. Review of the MIP images confirms the above findings IMPRESSION: 1. Negative for emergent large vessel occlusion or significant large vessel stenosis. 2. But positive for dolichoectatic distal cervical ICAs (up to 9 mm diameter), and at the C1-C2 level there is a linear filling defect within the right ICA. This lesion more resembles a small focal dissection rather  than adherent thrombus within the vessel. Perhaps the underlying dolichoectasia reflects an atypical appearance of FMD. 3. No intracranial arterial occlusion identified. Widespread irregularity of the bilateral distal PCAs. And more mild irregularity of distal anterior circulation branches. 4. Study discussed by telephone with Dr. Roland Rack on 01/16/2017 at 16:11 . Electronically Signed   By: Genevie Ann M.D.   On: 01/16/2017 16:12   Ct Head Code Stroke W/o Cm  Result Date: 01/16/2017 CLINICAL DATA:  Code stroke. 83 year old male, not otherwise specified. EXAM: CT HEAD WITHOUT CONTRAST TECHNIQUE: Contiguous axial images were obtained from the base of the skull through the vertex without intravenous contrast. COMPARISON:  None. FINDINGS: Brain: No midline shift, mass effect, or evidence of intracranial mass lesion. No acute intracranial hemorrhage identified. No ventriculomegaly. Patchy white matter hypodensity, most pronounced at the left anterior deep white matter capsules. No cortical encephalomalacia identified. Subtle asymmetric hypodensity in the left occipital pole. No other changes of acute cortically based infarct identified. Vascular: Calcified atherosclerosis at the skull base. No suspicious intracranial vascular hyperdensity. Skull: Osteopenia. No acute osseous abnormality identified. Sinuses/Orbits:  Clear aside from left mastoid sclerosis. Other: No acute orbit or scalp soft tissue finding. ASPECTS [facility] Stroke Program Early CT Score) - Ganglionic level infarction (caudate, lentiform nuclei, internal capsule, insula, M1-M3 cortex): 7 - Supraganglionic infarction (M4-M6 cortex): 3 Total score (0-10 with 10 being normal): 10 IMPRESSION: 1. Possible left PCA infarct with subtle hypodensity in the left occipital pole. No associated hemorrhage or mass effect. 2. No other CT evidence of acute cortically based infarct, with ASPECTS of 10. 3. The above was relayed via text pager to Dr. Roland Rack on 01/16/2017 at 15:49 . Electronically Signed   By: Genevie Ann M.D.   On: 01/16/2017 15:50     Kathrene Alu, MD 01/16/2017, 9:14 PM PGY-1, [place] Intern pager: [phone], text pages welcome  Upper Level Addendum:  I have seen and evaluated this patient along with Dr. Shan Levans and reviewed the above note, making necessary revisions in blue.  Harriet [place], [place], PGY-2

## 2017-01-17 NOTE — Care Management Note (Signed)
Case Management Note  Patient Details  Name: [name] [name] MRN: [id number] Date of Birth: [birth year]  Subjective/Objective:       CM following for progression and d/c planning.              Action/Plan: 01/17/2017 Met with pt and wife re OBS status, both verbalized understanding and state that he plan is to d/c today. No DME or HH needs.   Expected Discharge Date:     01/17/2017             Expected Discharge Plan:  Home/Self Care  In-House Referral:  NA  Discharge planning Services  NA  Post Acute Care Choice:  NA Choice offered to:  NA  DME Arranged:  N/A DME Agency:  NA  HH Arranged:  NA HH Agency:  NA  Status of Service:  Completed, signed off  If discussed at [place] of Stay Meetings, dates discussed:    Additional Comments:  Adron Bene, RN 01/17/2017, 3:00 PM

## 2017-01-17 NOTE — Progress Notes (Signed)
STROKE TEAM PROGRESS NOTE   HISTORY OF PRESENT ILLNESS (per record) [name] [name] is an 83 y.o. male with a past medical history significant for celiac artery aneurysm, AAA, diabetes, hypothyroidism, hyperlipidemia who presented to [facility] via EMS with complaints of aphasia. On arrival, his symptoms were largely resolved. He stated that he was on his way to the local [employer] around 1400 on 01/16/2017 when he noticed he was having trouble getting his words out, although at this time it was not particularly bothersome. His wife was with him at the [facility] when around 1410, he was unable to speak. His wife was witness to this. At that time, the two went home, and [name] [name] feels as though symptoms were resolving on the way home. EMS was called for [name] [name] to be brought in for evaluation.   Pertinent Imaging: CT head 7/19: Possible left PCA infarct with subtle hypodensity in the left occipital pole. No associated hemorrhage or mass effect. CTA head/neck 7/19: Negative for emergent large vessel occlusion or significant large vessel stenosis.  Pertinent Medications: Aspirin 81mg  daily Synthroid 99mcg daily  Date last known well: Date: 01/16/2017 Time last known well: Time: 02:00  Modified Rankin Score: 0  Stroke Risk Factors - diabetes mellitus and hyperlipidemia   Patient was not administered IV t-PA secondary to presenting with minimal symptoms. He was admitted to General Neurology for further evaluation and treatment.   SUBJECTIVE (INTERVAL HISTORY) His wife is at the bedside.  The patient is alert, oriented, and follows all commands appropriately.  He stated that he had transient aphasia for about one hour with word finding difficulties. Now resolved. MRI did show left punctate cerebellar infarct, however, not able to explain his symptoms.   OBJECTIVE Temp:  [97.8 F (36.6 C)-99.1 F (37.3 C)] 97.8 F (36.6 C) (07/20 0900) Pulse Rate:  [60-77] 69 (07/20 0659) Cardiac  Rhythm: Normal sinus rhythm (07/20 0703) Resp:  [11-20] 18 (07/20 0659) BP: (141-172)/(60-82) 156/60 (07/20 0900) SpO2:  [95 %-100 %] 95 % (07/20 0659) FiO2 (%):  [0 %] 0 % (07/19 1904) Weight:  [59.2 kg (130 lb 9.6 oz)-63.8 kg (140 lb 10.5 oz)] 59.2 kg (130 lb 9.6 oz) (07/19 2045)  CBC:  Recent Labs Lab 01/16/17 1536 01/16/17 1544 01/17/17 0733  WBC 6.0  --  5.8  NEUTROABS 3.7  --   --   HGB 13.4 13.3 14.5  HCT 38.6* 39.0 40.9  MCV 92.6  --  91.9  PLT 138*  --  142*    Basic Metabolic Panel:  Recent Labs Lab 01/16/17 1536 01/16/17 1544 01/17/17 0733  NA 136 139 141  K 4.2 4.2 4.0  CL 104 101 109  CO2 24  --  23  GLUCOSE 119* 120* 128*  BUN 21* 24* 17  CREATININE 1.11 1.10 0.99  CALCIUM 9.4  --  9.4    Lipid Panel:    Component Value Date/Time   CHOL 105 01/17/2017 0359   TRIG 48 01/17/2017 0359   HDL 40 (L) 01/17/2017 0359   CHOLHDL 2.6 01/17/2017 0359   VLDL 10 01/17/2017 0359   LDLCALC 55 01/17/2017 0359   HgbA1c: No results found for: HGBA1C Urine Drug Screen:    Component Value Date/Time   LABOPIA NONE DETECTED 01/16/2017 1618   COCAINSCRNUR NONE DETECTED 01/16/2017 1618   LABBENZ NONE DETECTED 01/16/2017 1618   AMPHETMU NONE DETECTED 01/16/2017 1618   THCU NONE DETECTED 01/16/2017 1618   LABBARB NONE DETECTED 01/16/2017 1618  Alcohol Level     Component Value Date/Time   ETH <5 01/16/2017 1536    IMAGING I have personally reviewed the radiological images below and agree with the radiology interpretations.  Ct Angio Head and neck W and Wo Contrast  01/16/2017 IMPRESSION: 1. Negative for emergent large vessel occlusion or significant large vessel stenosis. 2. But positive for dolichoectatic distal cervical ICAs (up to 9 mm diameter), and at the C1-C2 level there is a linear filling defect within the right ICA. This lesion more resembles a small focal dissection rather than adherent thrombus within the vessel. Perhaps the underlying  dolichoectasia reflects an atypical appearance of FMD. 3. No intracranial arterial occlusion identified. Widespread irregularity of the bilateral distal PCAs. And more mild irregularity of distal anterior circulation branches.  Mr Brain Wo Contrast 01/17/2017 IMPRESSION: 1. Punctate focus of acute ischemia in the left cerebellar hemisphere. No hemorrhage or mass effect. 2. Chronic microvascular ischemia.  Ct Head Code Stroke W/o Cm 01/16/2017 IMPRESSION: 1. Possible left PCA infarct with subtle hypodensity in the left occipital pole. No associated hemorrhage or mass effect. 2. No other CT evidence of acute cortically based infarct, with ASPECTS of 10.   2D Echo 01/17/2017 - Left ventricle: The cavity size was normal. There was mild focal   basal hypertrophy of the septum. Systolic function was normal.   The estimated ejection fraction was in the range of 60% to 65%.   Wall motion was normal; there were no regional wall motion   abnormalities. Doppler parameters are consistent with abnormal   left ventricular relaxation (grade 1 diastolic dysfunction). - Aortic valve: There was mild regurgitation. - Aortic root: The aortic root was normal in size. - Left atrium: The atrium was normal in size. - Right ventricle: Systolic function was normal. - Tricuspid valve: There was no regurgitation. - Pulmonary arteries: Systolic pressure was within the normal   range. - Inferior vena cava: The vessel was normal in size. - Pericardium, extracardiac: There was no pericardial effusion. Impressions: - No cardiac source of emboli was indentified.    PHYSICAL EXAM  Temp:  [97.8 F (36.6 C)-99.1 F (37.3 C)] 97.8 F (36.6 C) (07/20 1326) Pulse Rate:  [60-77] 71 (07/20 1326) Resp:  [13-20] 20 (07/20 1326) BP: (141-172)/(60-81) 147/66 (07/20 1326) SpO2:  [95 %-99 %] 96 % (07/20 1326) FiO2 (%):  [0 %] 0 % (07/19 1904) Weight:  [130 lb 9.6 oz (59.2 kg)] 130 lb 9.6 oz (59.2 kg) (07/19 2045)  General  - Well nourished, well developed, in no apparent distress.  Ophthalmologic - Sharp disc margins OU.   Cardiovascular - Regular rate and rhythm.  Mental Status -  Level of arousal and orientation to time, place, and person were intact. Language including expression, naming, repetition, comprehension was assessed and found intact. Attention span and concentration were normal. Fund of Knowledge was assessed and was intact.  Cranial Nerves II - XII - II - Visual field intact OU. III, IV, VI - Extraocular movements intact. V - Facial sensation intact bilaterally. VII - Facial movement intact bilaterally. VIII - Hearing & vestibular intact bilaterally. X - Palate elevates symmetrically. XI - Chin turning & shoulder shrug intact bilaterally. XII - Tongue protrusion intact.  Motor Strength - The patient's strength was normal in all extremities and pronator drift was absent.  Bulk was normal and fasciculations were absent.   Motor Tone - Muscle tone was assessed at the neck and appendages and was normal.  Reflexes - The  patient's reflexes were 1+ in all extremities and he had no pathological reflexes.  Sensory - Light touch, temperature/pinprick, vibration and proprioception were assessed and were symmetrical.    Coordination - The patient had normal movements in the hands and feet with no ataxia or dysmetria.  Tremor was absent.  Gait and Station - deferred   ASSESSMENT/PLAN [name] [name] is a 83 y.o. male with history of diabetes, hypothyroidism, hyperlipidemia who presented to [facility] via EMS with complaints of aphasia. He did not receive IV t-PA due to presenting with minimal symptoms.   Incidental stroke: Punctate focus of acute ischemia in the left cerebellar hemisphere, likely small vessel disease. However, this cannot explain patient presenting symptoms (aphasia) which are more concerning for embolic phenomena, will request 30 day cardiac event monitoring as  outpatient.  Resultant  no neuro deficit  CT head: Possible left PCA infarct ??  MRI head: Punctate focus of acute ischemia in the left cerebellar hemisphere. No hemorrhage or mass effect.  CTA head: No LVO, dolichoectatic distal cervical ICAs (up to 9 mm diameter), and at the C1-C2 level there is a linear filling defect within the right ICA which is chronic and no need for any intervention   2D Echo: EF 60-65%. No source of embolus  Due to concerning for embolic phenomena, recommend 30 day cardiac event monitoring as outpatient to rule out A. fib  LDL 55  HgbA1c pending  UDS negative  SCDs for VTE prophylaxis  Diet heart healthy/carb modified Room service appropriate? Yes; Fluid consistency: Thin  aspirin 81 mg daily prior to admission, now on aspirin 325 mg daily. Continue aspirin 325 mg daily as outpatient  Patient counseled to be compliant with his antithrombotic medications  Ongoing aggressive stroke risk factor management  Therapy recommendations: None  Disposition:  home  Hypertension  Stable  Long-term BP goal normotensive  Hyperlipidemia  Home meds: rosuvastatin 10mg  PO daily, resumed in hospital  LDL 55, goal < 70  Continue statin at discharge  Diabetes  HgbA1c pending, goal < 7.0  Controlled  SSI  Close PCP follow-up  Other Stroke Risk Factors  Advanced age  Obstructive sleep apnea, has CPAP, but does not wear it  Other Active Problems  None  Hospital day # 0  Neurology will sign off. Please call with questions. Pt will follow up with Cecille Rubin, NP, at [facility] in about 6 weeks. Thanks for the consult.  Rosalin Hawking, MD PhD Stroke Neurology 01/17/2017 5:00 PM    To contact Stroke Continuity provider, please refer to [email or web address] After hours, contact General Neurology

## 2017-01-17 NOTE — Care Management Obs Status (Signed)
[place] NOTIFICATION   Patient Details  Name: [name] [name] MRN: [id number] Date of Birth: February 25, 1933   Medicare Observation Status Notification Given:  Yes    Royal, Rory Percy, RN 01/17/2017, 1:12 PM

## 2017-01-17 NOTE — Progress Notes (Signed)
Family Medicine Teaching Service Daily Progress Note Intern Pager: [phone]  Patient name: [name] [name] Medical record number: [id number] Date of birth: 10-01-1932 Age: 83 y.o. Gender: male  Primary Care Provider: Coletta Memos, PA-C Consultants: Neurology  Code Status: Full   Pt Overview and Major Events to Date:  [name] [name] is an 83 y.o. Male presenting with expressive aphasia. Patient was admitted to [facility] on 01/16/2017.   Assessment and Plan: [name] [name] is a 83 y.o. male presenting with expressive aphasia. PMH is significant for Type II DM, HLD, hypothyroidism, restless leg syndrome, AAA.    Expressive Aphasia:  Imaging showing signs of stroke. Neurology is following. Symptoms have resolved since the episode occurred at 1400 on 7/19, and no other neurological deficits are reported by the patient. CT head showed possible left PCA infarct, but CTA negative for arterial occlusion, some tortuous vessels. Neurology recommended stroke work-up. MRI showing punctate focus of acute ischemia in left cerebellar hemisphere, no hemorrhage or mass effect, and chronic microvascular ischemia.Less likely hypoglycemia because patient's blood sugars are usually in the mid-100's, and this symptom is inconsistent with his usual hypoglycemic symptoms of diaphoresis and nausea.  CBG of 301 on admission, 126 currently. Patient's wife stated he had an episode of hypoglycemia on Wednesday, where patient was diaphoretic and fatigued. Improved after eating a candy bar. Lipid panel wnl, slightly decreased HDL of 40. Patient today is asymptomatic Echo showing LV EF 60-65%, no cardiac source of emboli identified.  -HbA1C -PT/OT/Speech consult -ASA 325 mg daily -neuro checks q2h -vitals per unit routine  Type II DM:  Unable to find last A1C, but patient carefully monitors blood sugars at home.  They usually range in the mid-100s and will sometimes go as low as 60, which will give him  hypoglycemic symptoms.  Takes glimepiride 0.5 mg daily and metformin 1 g BID at home. -hold home metformin due to contrast administration, consider d/c of glimeperide on discharge due to episodes of hypoglycemia  -continue sliding scale insulin while admitted  -CBGs q4h -A1C pending   HLD:  Takes Crestor 10 mg daily at home.  Will follow up on lipid panel. Lipid panel wnl, slightly decreased HDL of 40.  -continue home Crestor  Hypothyroidism:  No TSH on file.  Takes synthroid 75 mcg daily -continue home Synthroid -TSH  Restless Leg Syndrome:   Takes valium PRN for this at home. -continue home Valium  Abdominal Aortic Aneurysm:  Patient reports this is currently stable and was repaired in 2005. -CTA abdomen/pelvis  FEN/GI: heart healthy/carb modified diet  PPx: SCDs, ASA  Disposition: home  Subjective:  Patient today states he is feeling better. States no trouble with speech. Patient denies chest pain, SOB, or dizziness. Patient states episode of hypoglycemia on Wednesday where he was diaphoretic and fatigued but improved with eating a candy bar. Patient has history of AAA repair but does not bother him and he is followed as outpatient for this.   Objective: Temp:  [97.8 F (36.6 C)-99.1 F (37.3 C)] 97.8 F (36.6 C) (07/20 0900) Pulse Rate:  [60-77] 69 (07/20 0659) Resp:  [11-20] 18 (07/20 0659) BP: (141-172)/(60-82) 156/60 (07/20 0900) SpO2:  [95 %-100 %] 95 % (07/20 0659) FiO2 (%):  [0 %] 0 % (07/19 1904) Weight:  [130 lb 9.6 oz (59.2 kg)-140 lb 10.5 oz (63.8 kg)] 130 lb 9.6 oz (59.2 kg) (07/19 2045) Physical Exam: General: awake and alert, sitting in bed Cardiovascular: RRR, no MRG Respiratory: CTAB, no wheezes,  rales, or rhonchi  Abdomen: soft, non tender, non distended, bowel sounds x4 Extremities: no edema, full range of motion, no tenderness   Laboratory:  Recent Labs Lab 01/16/17 1536 01/16/17 1544 01/17/17 0733  WBC 6.0  --  5.8  HGB 13.4 13.3  14.5  HCT 38.6* 39.0 40.9  PLT 138*  --  142*    Recent Labs Lab 01/16/17 1536 01/16/17 1544 01/17/17 0733  NA 136 139 141  K 4.2 4.2 4.0  CL 104 101 109  CO2 24  --  23  BUN 21* 24* 17  CREATININE 1.11 1.10 0.99  CALCIUM 9.4  --  9.4  PROT 5.8*  --   --   BILITOT 0.3  --   --   ALKPHOS 58  --   --   ALT 18  --   --   AST 23  --   --   GLUCOSE 119* 120* 128*    Imaging/Diagnostic Tests: Ct Angio Head W Or Wo Contrast  Result Date: 01/16/2017 CLINICAL DATA:  83 year old male with aphasia, slurred speech. EXAM: CT ANGIOGRAPHY HEAD AND NECK TECHNIQUE: Multidetector CT imaging of the head and neck was performed using the standard protocol during bolus administration of intravenous contrast. Multiplanar CT image reconstructions and MIPs were obtained to evaluate the vascular anatomy. Carotid stenosis measurements (when applicable) are obtained utilizing NASCET criteria, using the distal internal carotid diameter as the denominator. CONTRAST:  50 mL Isovue 370 COMPARISON:  Head CT without contrast 1546 hours today. FINDINGS: CTA NECK Skeleton: Heterogeneous bone mineralization throughout the cervical spine is probably degenerative in nature. No acute osseous abnormality identified. Upper chest: Negative lung apices and visible superior mediastinum. Other neck: Negative.  No cervical lymphadenopathy. Aortic arch: 3 vessel arch configuration. Mild for age soft and calcified arch atherosclerosis. Right carotid system: Negative right CCA origin. Calcified plaque at the right ICA origin and distal bulb, but no stenosis. Tortuous cervical right ICA, and at the C1-C2 level the vessel it is dolichoectatic measuring up to 9 mm diameter. There is a linear defect through the central aspect of the vessel there (series 5, image 85 and series 9, image 76) which more resembles a short segment dissection than adherent clot. The vessel is otherwise widely patent to the skullbase. Left carotid system: No left  CCA stenosis despite mild soft and calcified plaque. Widely patent left carotid bifurcation. Just distal to the bulb the vessel has a fairly normal caliber, but below the skullbase the cervical left ICA is dolichoectatic similar to the opposite right side, measuring 7-8 mm diameter. No beading of the vessel or intraluminal filling defect. Vertebral arteries:No proximal subclavian artery stenosis despite soft and calcified plaque on the left. Normal left vertebral artery origin. Calcified plaque at the right vertebral artery origin with only mild stenosis. Tortuous fairly codominant vertebral arteries are otherwise patent to the skullbase without stenosis. CTA HEAD Posterior circulation: Codominant distal vertebral arteries with patent PICA origins. Mild distal vertebral artery irregularity without stenosis. Tortuous vertebrobasilar junction. Mild basilar artery irregularity without stenosis. Normal SCA and left PCA origins. Fetal type right PCA. Left posterior communicating artery diminutive or absent. The left PCA is somewhat diminutive compared to the right, but is patent with no proximal stenosis. There is bilateral moderate distal PCA branch irregularity and stenosis which appears fairly symmetric (series 10, image 21). Anterior circulation: Both ICA siphons are patent and mildly dolichoectatic. There is distal cavernous and supraclinoid segment calcified plaque bilaterally. However this does not  appear to be hemodynamically significant. Ophthalmic and right posterior communicating artery origins are normal. Patent carotid termini. Normal MCA and ACA origins. Anterior communicating artery and bilateral ACA branches are normal aside from tortuosity. Left MCA M1 segment, left MCA trifurcation, and left MCA branches are patent. There is mild left MCA branch irregularity. Right MCA M1 segment, right MCA trifurcation, and right MCA branches are patent. There is mild right MCA branch irregularity. Venous sinuses: Not  opacified on these early contrast phase images. Anatomic variants: None. Review of the MIP images confirms the above findings IMPRESSION: 1. Negative for emergent large vessel occlusion or significant large vessel stenosis. 2. But positive for dolichoectatic distal cervical ICAs (up to 9 mm diameter), and at the C1-C2 level there is a linear filling defect within the right ICA. This lesion more resembles a small focal dissection rather than adherent thrombus within the vessel. Perhaps the underlying dolichoectasia reflects an atypical appearance of FMD. 3. No intracranial arterial occlusion identified. Widespread irregularity of the bilateral distal PCAs. And more mild irregularity of distal anterior circulation branches. 4. Study discussed by telephone with Dr. Roland Rack on 01/16/2017 at 16:11 . Electronically Signed   By: Genevie Ann M.D.   On: 01/16/2017 16:12   Ct Angio Neck W Or Wo Contrast  Result Date: 01/16/2017 CLINICAL DATA:  83 year old male with aphasia, slurred speech. EXAM: CT ANGIOGRAPHY HEAD AND NECK TECHNIQUE: Multidetector CT imaging of the head and neck was performed using the standard protocol during bolus administration of intravenous contrast. Multiplanar CT image reconstructions and MIPs were obtained to evaluate the vascular anatomy. Carotid stenosis measurements (when applicable) are obtained utilizing NASCET criteria, using the distal internal carotid diameter as the denominator. CONTRAST:  50 mL Isovue 370 COMPARISON:  Head CT without contrast 1546 hours today. FINDINGS: CTA NECK Skeleton: Heterogeneous bone mineralization throughout the cervical spine is probably degenerative in nature. No acute osseous abnormality identified. Upper chest: Negative lung apices and visible superior mediastinum. Other neck: Negative.  No cervical lymphadenopathy. Aortic arch: 3 vessel arch configuration. Mild for age soft and calcified arch atherosclerosis. Right carotid system: Negative right CCA  origin. Calcified plaque at the right ICA origin and distal bulb, but no stenosis. Tortuous cervical right ICA, and at the C1-C2 level the vessel it is dolichoectatic measuring up to 9 mm diameter. There is a linear defect through the central aspect of the vessel there (series 5, image 85 and series 9, image 76) which more resembles a short segment dissection than adherent clot. The vessel is otherwise widely patent to the skullbase. Left carotid system: No left CCA stenosis despite mild soft and calcified plaque. Widely patent left carotid bifurcation. Just distal to the bulb the vessel has a fairly normal caliber, but below the skullbase the cervical left ICA is dolichoectatic similar to the opposite right side, measuring 7-8 mm diameter. No beading of the vessel or intraluminal filling defect. Vertebral arteries:No proximal subclavian artery stenosis despite soft and calcified plaque on the left. Normal left vertebral artery origin. Calcified plaque at the right vertebral artery origin with only mild stenosis. Tortuous fairly codominant vertebral arteries are otherwise patent to the skullbase without stenosis. CTA HEAD Posterior circulation: Codominant distal vertebral arteries with patent PICA origins. Mild distal vertebral artery irregularity without stenosis. Tortuous vertebrobasilar junction. Mild basilar artery irregularity without stenosis. Normal SCA and left PCA origins. Fetal type right PCA. Left posterior communicating artery diminutive or absent. The left PCA is somewhat diminutive compared to the right,  but is patent with no proximal stenosis. There is bilateral moderate distal PCA branch irregularity and stenosis which appears fairly symmetric (series 10, image 21). Anterior circulation: Both ICA siphons are patent and mildly dolichoectatic. There is distal cavernous and supraclinoid segment calcified plaque bilaterally. However this does not appear to be hemodynamically significant. Ophthalmic and  right posterior communicating artery origins are normal. Patent carotid termini. Normal MCA and ACA origins. Anterior communicating artery and bilateral ACA branches are normal aside from tortuosity. Left MCA M1 segment, left MCA trifurcation, and left MCA branches are patent. There is mild left MCA branch irregularity. Right MCA M1 segment, right MCA trifurcation, and right MCA branches are patent. There is mild right MCA branch irregularity. Venous sinuses: Not opacified on these early contrast phase images. Anatomic variants: None. Review of the MIP images confirms the above findings IMPRESSION: 1. Negative for emergent large vessel occlusion or significant large vessel stenosis. 2. But positive for dolichoectatic distal cervical ICAs (up to 9 mm diameter), and at the C1-C2 level there is a linear filling defect within the right ICA. This lesion more resembles a small focal dissection rather than adherent thrombus within the vessel. Perhaps the underlying dolichoectasia reflects an atypical appearance of FMD. 3. No intracranial arterial occlusion identified. Widespread irregularity of the bilateral distal PCAs. And more mild irregularity of distal anterior circulation branches. 4. Study discussed by telephone with Dr. Roland Rack on 01/16/2017 at 16:11 . Electronically Signed   By: Genevie Ann M.D.   On: 01/16/2017 16:12   Mr Brain Wo Contrast  Result Date: 01/17/2017 CLINICAL DATA:  Expressive aphasia EXAM: MRI HEAD WITHOUT CONTRAST TECHNIQUE: Multiplanar, multiecho pulse sequences of the brain and surrounding structures were obtained without intravenous contrast. COMPARISON:  CTA head neck 01/16/2017 FINDINGS: Brain: The midline structures are normal. There is a punctate focus of diffusion restriction in the left cerebellar hemisphere. There is multifocal hyperintense T2-weighted signal within the periventricular white matter, most often seen in the setting of chronic microvascular ischemia. No  intraparenchymal hematoma or chronic microhemorrhage. Brain volume is normal for age without age-advanced or lobar predominant atrophy. The dura is normal and there is no extra-axial collection. There is a posterior fossa arachnoid cyst versus mega cisterna magna. Vascular: Major intracranial arterial and venous sinus flow voids are preserved. Skull and upper cervical spine: The visualized skull base, calvarium, upper cervical spine and extracranial soft tissues are normal. Sinuses/Orbits: No fluid levels or advanced mucosal thickening. No mastoid effusion. Normal orbits. IMPRESSION: 1. Punctate focus of acute ischemia in the left cerebellar hemisphere. No hemorrhage or mass effect. 2. Chronic microvascular ischemia. Electronically Signed   By: Ulyses Jarred M.D.   On: 01/17/2017 02:50   Ct Head Code Stroke W/o Cm  Result Date: 01/16/2017 CLINICAL DATA:  Code stroke. 83 year old male, not otherwise specified. EXAM: CT HEAD WITHOUT CONTRAST TECHNIQUE: Contiguous axial images were obtained from the base of the skull through the vertex without intravenous contrast. COMPARISON:  None. FINDINGS: Brain: No midline shift, mass effect, or evidence of intracranial mass lesion. No acute intracranial hemorrhage identified. No ventriculomegaly. Patchy white matter hypodensity, most pronounced at the left anterior deep white matter capsules. No cortical encephalomalacia identified. Subtle asymmetric hypodensity in the left occipital pole. No other changes of acute cortically based infarct identified. Vascular: Calcified atherosclerosis at the skull base. No suspicious intracranial vascular hyperdensity. Skull: Osteopenia. No acute osseous abnormality identified. Sinuses/Orbits:  Clear aside from left mastoid sclerosis. Other: No acute orbit or scalp soft  tissue finding. ASPECTS [place] Stroke Program Early CT Score) - Ganglionic level infarction (caudate, lentiform nuclei, internal capsule, insula, M1-M3 cortex): 7 -  Supraganglionic infarction (M4-M6 cortex): 3 Total score (0-10 with 10 being normal): 10 IMPRESSION: 1. Possible left PCA infarct with subtle hypodensity in the left occipital pole. No associated hemorrhage or mass effect. 2. No other CT evidence of acute cortically based infarct, with ASPECTS of 10. 3. The above was relayed via text pager to Dr. Roland Rack on 01/16/2017 at 15:49 . Electronically Signed   By: Genevie Ann M.D.   On: 01/16/2017 15:50     Caroline More, DO 01/17/2017, 11:35 AM PGY-1, [place] Intern pager: [phone], text pages welcome

## 2017-01-17 NOTE — Evaluation (Signed)
Occupational Therapy Evaluation and Discharge Patient Details Name: [name] [name] MRN: [id number] DOB: April 18, 1933 Today's Date: 01/17/2017    History of Present Illness Pt is an 83 y/o male admitted secondary to expressive aphasia. MRI revealed punctate focus of acute ischemia in the L cerebellar hemisphere with no hemorrhage or mass effect. PMH including but not limited to DM, HLD and AAA.   Clinical Impression   PTA pt independent in ADL and mobility. Pt is currently modified independent for all ADL and mobility. Pt reports no changes in vision with this hospitalization and was able to navigate room without incidence. No questions or concerns for OT from Pt and wife and OT feels the Pt is safe to dc from OT perspective, no DME needs. OT will sign off, thank you for this referral.     Follow Up Recommendations  No OT follow up    Equipment Recommendations  None recommended by OT    Recommendations for Other Services       Precautions / Restrictions Restrictions Weight Bearing Restrictions: No      Mobility Bed Mobility Overal bed mobility: Modified Independent                Transfers Overall transfer level: Independent Equipment used: None                  Balance Overall balance assessment: Needs assistance Sitting-balance support: Feet supported Sitting balance-Leahy Scale: Normal     Standing balance support: During functional activity;No upper extremity supported Standing balance-Leahy Scale: Good                             ADL either performed or assessed with clinical judgement   ADL Overall ADL's : Modified independent                                             Vision Baseline Vision/History: Wears glasses Wears Glasses: At all times Patient Visual Report: No change from baseline Vision Assessment?: No apparent visual deficits     Perception     Praxis      Pertinent Vitals/Pain Pain Assessment:  No/denies pain     Hand Dominance Right   Extremity/Trunk Assessment Upper Extremity Assessment Upper Extremity Assessment: Overall WFL for tasks assessed   Lower Extremity Assessment Lower Extremity Assessment: Overall WFL for tasks assessed       Communication Communication Communication: No difficulties   Cognition Arousal/Alertness: Awake/alert Behavior During Therapy: WFL for tasks assessed/performed Overall Cognitive Status: Within Functional Limits for tasks assessed                                     General Comments  Wife Present during session    Exercises     Shoulder Instructions      Home Living Family/patient expects to be discharged to:: Private residence Living Arrangements: Spouse/significant other Available Help at Discharge: Family;Available 24 hours/day Type of Home: House Home Access: Level entry     Home Layout: One level     Bathroom Shower/Tub: Occupational psychologist: Standard     Home Equipment: Kasandra Knudsen - single point      Lives With: Spouse    Prior Functioning/Environment Level of Independence: Independent  Comments: still drives        OT Problem List:        OT Treatment/Interventions:      OT Goals(Current goals can be found in the care plan section) Acute Rehab OT Goals Patient Stated Goal: return home OT Goal Formulation: With patient/family Time For Goal Achievement: 01/24/17 Potential to Achieve Goals: Good  OT Frequency:     Barriers to D/C:            Co-evaluation              AM-PAC PT "6 Clicks" Daily Activity     Outcome Measure Help from another person eating meals?: None Help from another person taking care of personal grooming?: None Help from another person toileting, which includes using toliet, bedpan, or urinal?: None Help from another person bathing (including washing, rinsing, drying)?: None Help from another person to put on and taking off regular  upper body clothing?: None Help from another person to put on and taking off regular lower body clothing?: None 6 Click Score: 24   End of Session Nurse Communication: Mobility status  Activity Tolerance: Patient tolerated treatment well Patient left: in bed;with call bell/phone within reach;with family/visitor present  OT Visit Diagnosis: Other symptoms and signs involving the nervous system (R29.898)                Time: 8563-1497 OT Time Calculation (min): 18 min Charges:  OT General Charges $OT Visit: 1 Procedure OT Evaluation $OT Eval Low Complexity: 1 Procedure G-Codes: OT G-codes **NOT FOR INPATIENT CLASS** Functional Assessment Tool Used: AM-PAC 6 Clicks Daily Activity Functional Limitation: Self care Self Care Current Status (W2637): 0 percent impaired, limited or restricted Self Care Goal Status (C5885): 0 percent impaired, limited or restricted Self Care Discharge Status (O2774): 0 percent impaired, limited or restricted   Hulda Humphrey OTR/L [place] 01/17/2017, 4:09 PM

## 2017-01-21 DIAGNOSIS — R4701 Aphasia: Secondary | ICD-10-CM | POA: Diagnosis not present

## 2017-01-21 DIAGNOSIS — H6123 Impacted cerumen, bilateral: Secondary | ICD-10-CM | POA: Diagnosis not present

## 2017-01-21 DIAGNOSIS — I7771 Dissection of carotid artery: Secondary | ICD-10-CM | POA: Diagnosis not present

## 2017-01-21 DIAGNOSIS — I639 Cerebral infarction, unspecified: Secondary | ICD-10-CM | POA: Diagnosis not present

## 2017-01-22 LAB — HEMOGLOBIN A1C
Hgb A1c MFr Bld: 7.4 % — ABNORMAL HIGH (ref 4.8–5.6)
Mean Plasma Glucose: 166 mg/dL

## 2017-01-24 ENCOUNTER — Other Ambulatory Visit: Payer: Self-pay | Admitting: *Deleted

## 2017-01-24 ENCOUNTER — Encounter (INDEPENDENT_AMBULATORY_CARE_PROVIDER_SITE_OTHER): Payer: Self-pay

## 2017-01-24 ENCOUNTER — Ambulatory Visit (INDEPENDENT_AMBULATORY_CARE_PROVIDER_SITE_OTHER): Payer: PPO | Admitting: Cardiology

## 2017-01-24 ENCOUNTER — Encounter: Payer: Self-pay | Admitting: Cardiology

## 2017-01-24 VITALS — BP 132/60 | HR 84 | Ht 64.0 in | Wt 135.0 lb

## 2017-01-24 DIAGNOSIS — I639 Cerebral infarction, unspecified: Secondary | ICD-10-CM

## 2017-01-24 NOTE — Progress Notes (Signed)
01/24/2017 [name] [name]   August 07, 1932  [id number]  Primary Physician [name] Jordan, MD Primary Cardiologist: New (Dr. Saunders [name])   Reason for Visit/CC: New Patient Evaluation s/p CVA (referred for 30 day monitor)  HPI:  [name] [name] is a 83 y.o. male who is being seen today, as a new patient, at the request of [name] Memos, PA-C. Pt was recently admitted to [facility] and found to have a CVA. Cardiology referral placed for 30 day monitor to assess for atrial arrhythmias. Of note, echo was perfomed during hospitalization.  LVEF 60-65%, no cardiac source of emboli identified. Neurology placed him on 325 mg of ASA. Additional PMH includes HTN, HLD and AAA s/p repair, which is followed by Dr. Oneida [name].   He denies any palpitations. No CP or dyspnea. No residual side effects from his stroke.    Current Meds  Medication Sig  . aspirin 325 MG tablet Take 1 tablet (325 mg total) by mouth daily.  . cholecalciferol (VITAMIN D) 1000 units tablet Take 1,000 Units by mouth daily with supper.  . Coenzyme Q10 (COQ10) 100 MG CAPS Take 100 mg by mouth daily.  . diazepam (VALIUM) 5 MG tablet Take 2.5-5 mg by mouth at bedtime as needed for anxiety (restless legs).  Marland Kitchen levothyroxine (SYNTHROID, LEVOTHROID) 75 MCG tablet Take 75 mcg by mouth daily before breakfast.   . loratadine (CLARITIN) 10 MG tablet Take 10 mg by mouth daily as needed for allergies.   . metFORMIN (GLUCOPHAGE) 1000 MG tablet Take 1,000 mg by mouth 2 (two) times daily.  . Multiple Vitamin (MULTIVITAMIN WITH MINERALS) TABS tablet Take 1 tablet by mouth daily.  . Omega-3 1000 MG CAPS Take 1,000 mg by mouth daily.  Marland Kitchen omeprazole (PRILOSEC) 20 MG capsule Take 20 mg by mouth at bedtime.   . rosuvastatin (CRESTOR) 10 MG tablet Take 10 mg by mouth at bedtime.   . sildenafil (REVATIO) 20 MG tablet Take 40-100 mg by mouth as needed. For sexual activity  . vitamin B-12 (CYANOCOBALAMIN) 1000 MCG tablet Take 1,000 mcg by mouth daily.    Allergies  Allergen Reactions  . Actos [Pioglitazone] Swelling  . Allegra [Fexofenadine] Other (See Comments)    stimulant  . Iodine Swelling  . Metformin And Related Diarrhea    Pt states that he has a reaction to the extended release metformin but takes the regular  . Onglyza [Saxagliptin] Other (See Comments)    Weight loss  . Shellfish Allergy Swelling   Past Medical History:  Diagnosis Date  . AAA (abdominal aortic aneurysm) ([place])   . Celiac artery aneurysm ([place])   . Diabetes mellitus without complication ([place])   . Hiatal hernia   . Hyperlipidemia    Family History  Problem Relation Age of Onset  . Arthritis Mother        Rheumatoid arthritis   Past Surgical History:  Procedure Laterality Date  . ABDOMINAL AORTIC ANEURYSM REPAIR  10-2003   also had iliac aneurysm repair  . CHOLECYSTECTOMY     Laparoscopic cholecystectomy  . EYE SURGERY Bilateral    cataracts   Social History   Social History  . Marital status: Married    Spouse name: N/A  . Number of children: N/A  . Years of education: N/A   Occupational History  . Not on file.   Social History Main Topics  . Smoking status: Never Smoker  . Smokeless tobacco: Never Used  . Alcohol use No  . Drug use: No  .  Sexual activity: Not on file   Other Topics Concern  . Not on file   Social History Narrative  . No narrative on file     Review of Systems: General: negative for chills, fever, night sweats or weight changes.  Cardiovascular: negative for chest pain, dyspnea on exertion, edema, orthopnea, palpitations, paroxysmal nocturnal dyspnea or shortness of breath Dermatological: negative for rash Respiratory: negative for cough or wheezing Urologic: negative for hematuria Abdominal: negative for nausea, vomiting, diarrhea, bright red blood per rectum, melena, or hematemesis Neurologic: negative for visual changes, syncope, or dizziness All other systems reviewed and are otherwise negative except as  noted above.   Physical Exam:  Blood pressure 132/60, pulse 84, height 5\' 4"  (1.626 m), weight 135 lb (61.2 kg).  General appearance: alert, cooperative and no distress Neck: no carotid bruit and no JVD Lungs: clear to auscultation bilaterally Heart: regular rate and rhythm, S1, S2 normal, no murmur, click, rub or gallop Extremities: extremities normal, atraumatic, no cyanosis or edema Pulses: 2+ and symmetric Skin: Skin color, texture, turgor normal. No rashes or lesions Neurologic: Grossly normal  EKG not performed -- personally reviewed   ASSESSMENT AND PLAN:    1. CVA: recently diagnosed. No residual deficits. TTE unremarkable. LVEF 60-65%, no cardiac source of emboli identified. No arrhythmia swere noted on telemetry. Neurology placed him on 325 mg of ASA + statin. We will arrange 30 day monitor to r/o cardiac arrhthymias. If negative consider implantable loop. We will have him f/u with EP in 5 weeks to determine need for long term monitoring if uneventful monitor. Pt was also seen and examined by Dr. Saunders [name], DOD, who also agrees with plan.   Follow-Up EP in 5 weeks to decide on loop if negative 30 day monitor.   [name] [name], MHS [facility] 01/24/2017 12:22 PM

## 2017-01-24 NOTE — Patient Outreach (Signed)
[facility]) Care Management  01/24/2017  [name] [name] 12-23-1932 [id number]  EMMI-Stroke RED ON EMMI ALERT DAY#: 3 DATE: 01/23/17 RED ALERT: Feeling worse overall? Yes New problems walking/talking/speaking/seeing? Yes   Outreach attempt # 1, spoke with patient. Reviewed and addressed red alert. Patient reported he is doing fine. Patient reported, his answers were recorded incorrectly. He had a doctor's appointment today (01/24/17) with his PCP. Patient stated "everything went well". He requested to be removed from Stroke automated phone list.    Plan: RN CM will notify [facility] CM administrative assistant regarding case closure.  RN CM will notify [facility] CM administrative assistant to remove patient from automated phone calls (Stroke).   [place] Bells, RN, BSN, MHA/MSL, [place] Telephonic Care Manager Coordinator [facility] Direct Phone: [phone] Toll Free: [phone] Fax: [phone]

## 2017-01-24 NOTE — Patient Instructions (Addendum)
Medication Instructions:  Your physician recommends that you continue on your current medications as directed. Please refer to the Current Medication list given to you today.   Labwork: None Ordered   Testing/Procedures: Your physician has recommended that you wear an event monitor. Event monitors are medical devices that record the heart's electrical activity. Doctors most often [place] these monitors to diagnose arrhythmias. Arrhythmias are problems with the speed or rhythm of the heartbeat. The monitor is a small, portable device. You can wear one while you do your normal daily activities. This is usually used to diagnose what is causing palpitations/syncope (passing out).   Follow-Up: Your physician recommends that you schedule a follow-up appointment in: 6 weeks with [name], PA or Chanetta Marshall, NP for evaluation following monitor results   If you need a refill on your cardiac medications before your next appointment, please call your pharmacy.   Thank you for choosing [facility]! Christen Bame, RN [phone]

## 2017-01-31 ENCOUNTER — Other Ambulatory Visit: Payer: Self-pay | Admitting: Cardiology

## 2017-01-31 ENCOUNTER — Ambulatory Visit (INDEPENDENT_AMBULATORY_CARE_PROVIDER_SITE_OTHER): Payer: PPO

## 2017-01-31 ENCOUNTER — Encounter: Payer: Self-pay | Admitting: *Deleted

## 2017-01-31 DIAGNOSIS — I639 Cerebral infarction, unspecified: Secondary | ICD-10-CM | POA: Diagnosis not present

## 2017-01-31 DIAGNOSIS — I4891 Unspecified atrial fibrillation: Secondary | ICD-10-CM

## 2017-01-31 NOTE — Progress Notes (Signed)
Patient has follow up for event monitor scheduled in Sept with [name].

## 2017-01-31 NOTE — Progress Notes (Signed)
ok 

## 2017-01-31 NOTE — Progress Notes (Signed)
I am fine with him following with Dr. Radford Pax. I saw him with Brittainy as DOD.  [name] [name]

## 2017-01-31 NOTE — Progress Notes (Signed)
Patient ID: [name] [name], male   DOB: 1932-12-17, 83 y.o.   MRN: [id number] Patient came in 01/31/17 to have cardiac event monitor applied.  There is some confusion regarding who will follow up with patient.  Patient has seen Dr. Radford Pax in the past and want to keep seeing her.

## 2017-02-01 DIAGNOSIS — I639 Cerebral infarction, unspecified: Secondary | ICD-10-CM | POA: Diagnosis not present

## 2017-02-01 DIAGNOSIS — I4891 Unspecified atrial fibrillation: Secondary | ICD-10-CM | POA: Diagnosis not present

## 2017-02-03 DIAGNOSIS — R413 Other amnesia: Secondary | ICD-10-CM | POA: Diagnosis not present

## 2017-02-03 DIAGNOSIS — R259 Unspecified abnormal involuntary movements: Secondary | ICD-10-CM | POA: Diagnosis not present

## 2017-02-03 DIAGNOSIS — G451 Carotid artery syndrome (hemispheric): Secondary | ICD-10-CM | POA: Diagnosis not present

## 2017-02-03 DIAGNOSIS — I7771 Dissection of carotid artery: Secondary | ICD-10-CM | POA: Diagnosis not present

## 2017-02-04 DIAGNOSIS — G451 Carotid artery syndrome (hemispheric): Secondary | ICD-10-CM | POA: Insufficient documentation

## 2017-02-04 DIAGNOSIS — G252 Other specified forms of tremor: Secondary | ICD-10-CM | POA: Insufficient documentation

## 2017-02-04 DIAGNOSIS — I7771 Dissection of carotid artery: Secondary | ICD-10-CM | POA: Insufficient documentation

## 2017-02-12 ENCOUNTER — Telehealth: Payer: Self-pay | Admitting: *Deleted

## 2017-02-12 ENCOUNTER — Telehealth: Payer: Self-pay | Admitting: Cardiology

## 2017-02-12 NOTE — Telephone Encounter (Signed)
° ° °  1. Is this related to a heart monitor you are wearing?  (If the patient says no, please ask     if they are caling about ICD/pacemaker.) yes  2. What is your issue??(If the patient is calling for results of the heart monitor this     message should be sent to nurse.) Patient wife calling, states that patient was told he needed to have monitor for 30 days but received a prescription for 21 days instead. Patient wife is confused.

## 2017-02-13 DIAGNOSIS — Z5181 Encounter for therapeutic drug level monitoring: Secondary | ICD-10-CM | POA: Diagnosis not present

## 2017-02-13 DIAGNOSIS — E119 Type 2 diabetes mellitus without complications: Secondary | ICD-10-CM | POA: Diagnosis not present

## 2017-02-13 DIAGNOSIS — R350 Frequency of micturition: Secondary | ICD-10-CM | POA: Diagnosis not present

## 2017-02-13 DIAGNOSIS — N529 Male erectile dysfunction, unspecified: Secondary | ICD-10-CM | POA: Diagnosis not present

## 2017-02-13 DIAGNOSIS — E039 Hypothyroidism, unspecified: Secondary | ICD-10-CM | POA: Diagnosis not present

## 2017-02-13 NOTE — Telephone Encounter (Signed)
Confirmed with [employer] [name] [name] cardiac event monitor is transmitting.  He is to have a full 30 days of service and his EOS date is 03/02/17.  Patient had 21 days of service left on the day they spoke with [employer].  The battery is not being used up as quickly now that the monitor has been reset for their cell phone towers.  If the monitor was trying to transmit over and over, the battery would be used up quickly, as it had been during his first week of service.

## 2017-02-28 DIAGNOSIS — E119 Type 2 diabetes mellitus without complications: Secondary | ICD-10-CM | POA: Diagnosis not present

## 2017-03-14 NOTE — Progress Notes (Signed)
Cardiology Office Note Date:  03/17/2017  Patient ID:  [name] [name], DOB 12/04/1932, MRN [id number] PCP:  [name], MD  Cardiologist:  Dr. Radford Pax (patient request) Neurologist: Dr. Everette Rank   Chief Complaint: f/u EM findings, consideration for ILR  History of Present Illness: [name] [name] is a 83 y.o. male with history of DM, HLD, RLS, AAA and iliac artery aneurysm (both repaired in 2005), most recently a CVA  Associated with expressive aphasia Neurology noted: Punctate focus of acute ischemia in the left cerebellar hemisphere, likely small vessel disease. However, this cannot explain patient presenting symptoms (aphasia) which are more concerning for embolic phenomena, will request 30 day cardiac event monitoring as outpatient.  He was seen by Lyda Jester, PAC in f/u (and Dr. Saunders Revel DOD) July in follow up to arrange monitoring with recommendation if 30day monitor was negative to be considered for ILR with EP consultation.  In-hospital TTE was negative for evidence of emboli, normal LVEF,  His 30 day monitor disclosed no arrhythmias.  EPIC EKGs and CV strips reviewed with SR only. CT head 7/19: Possible left PCA infarct with subtle hypodensity in the left occipital pole. No associated hemorrhage or mass effect. CTA head/neck 7/19: Negative for emergent large vessel occlusion or significant large vessel stenosis.  The patient denies any kind of CP, palpitation sor SOB, no dizziness, near syncope or syncope.  He is accompanied by his wife today.  States that while wearing the monitor he was quite active,one day weed-eating/lawn work.     Past Medical History:  Diagnosis Date  . AAA (abdominal aortic aneurysm) ([place])   . 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 Diabetes mellitus without complication ([place])   . Hiatal hernia   . Hyperlipidemia     Past Surgical History:  Procedure Laterality Date  . ABDOMINAL AORTIC ANEURYSM REPAIR  10-2003   also had iliac aneurysm  repair  . CHOLECYSTECTOMY     Laparoscopic cholecystectomy  . EYE SURGERY Bilateral    cataracts    Current Outpatient Prescriptions  Medication Sig Dispense Refill  . aspirin 325 MG tablet Take 1 tablet (325 mg total) by mouth daily. 30 tablet 0  . cholecalciferol (VITAMIN D) 1000 units tablet Take 1,000 Units by mouth daily with supper.    . Coenzyme Q10 (COQ10) 100 MG CAPS Take 100 mg by mouth daily.    . diazepam (VALIUM) 5 MG tablet Take 2.5-5 mg by mouth at bedtime as needed for anxiety (restless legs).    Marland Kitchen levothyroxine (SYNTHROID, LEVOTHROID) 75 MCG tablet Take 75 mcg by mouth daily before breakfast.     . loratadine (CLARITIN) 10 MG tablet Take 10 mg by mouth daily as needed for allergies.     . metFORMIN (GLUCOPHAGE) 1000 MG tablet Take 1,000 mg by mouth 2 (two) times daily.    . Multiple Vitamin (MULTIVITAMIN WITH MINERALS) TABS tablet Take 1 tablet by mouth daily.    . Omega-3 1000 MG CAPS Take 1,000 mg by mouth daily.    Marland Kitchen omeprazole (PRILOSEC) 20 MG capsule Take 20 mg by mouth at bedtime.     . rosuvastatin (CRESTOR) 10 MG tablet Take 10 mg by mouth at bedtime.     . sildenafil (REVATIO) 20 MG tablet Take 40-100 mg by mouth as needed. For sexual activity  4  . vitamin B-12 (CYANOCOBALAMIN) 1000 MCG tablet Take 1,000 mcg by mouth daily.     No current facility-administered medications for this visit.  Allergies:   Actos [pioglitazone]; Allegra [fexofenadine]; Iodine; Metformin and related; Onglyza [saxagliptin]; and Shellfish allergy   Social History:  The patient  reports that he has never smoked. He has never used smokeless tobacco. He reports that he does not drink alcohol or use drugs.   Family History:  The patient's family history includes Arthritis in his mother.  ROS:  Please see the history of present illness.  All other systems are reviewed and otherwise negative.   PHYSICAL EXAM:  VS:  BP 124/70   Pulse 77   Ht 5\' 4"  (1.626 m)   Wt 132 lb (59.9 kg)    BMI 22.66 kg/m  BMI: Body mass index is 22.66 kg/m. Well nourished, well developed, in no acute distress  HEENT: normocephalic, atraumatic  Neck: no JVD, carotid bruits or masses Cardiac:  RRR; no significant murmurs, no rubs, or gallops Lungs:    CTA b/l, no wheezing, rhonchi or rales  Abd: soft, nontender MS: no deformity or atrophy Ext: no edema  Skin: warm and dry, no rash Neuro:  No gross deficits appreciated Psych: euthymic mood, full affect   EKG:  Not done today  Aug 2018: 30day EM  Normal Sinus Rhythm, Sinus Tachycardia and Sinus Bradycardia with average heart rate 72bpm. The heart rate ranged from 55 to 124bpm.  01/17/17: TTE Study Conclusions - Left ventricle: The cavity size was normal. There was mild focal   basal hypertrophy of the septum. Systolic function was normal.   The estimated ejection fraction was in the range of 60% to 65%.   Wall motion was normal; there were no regional wall motion   abnormalities. Doppler parameters are consistent with abnormal   left ventricular relaxation (grade 1 diastolic dysfunction). - Aortic valve: There was mild regurgitation. - Aortic root: The aortic root was normal in size. - Left atrium: The atrium was normal in size. - Right ventricle: Systolic function was normal. - Tricuspid valve: There was no regurgitation. - Pulmonary arteries: Systolic pressure was within the normal   range. - Inferior vena cava: The vessel was normal in size. - Pericardium, extracardiac: There was no pericardial effusion. Impressions: - No cardiac source of emboli was indentified.  Recent Labs: 01/16/2017: ALT 18 01/17/2017: BUN 17; Creatinine, Ser 0.99; Hemoglobin 14.5; Platelets 142; Potassium 4.0; Sodium 141  01/17/2017: Cholesterol 105; HDL 40; LDL Cholesterol 55; Total CHOL/HDL Ratio 2.6; Triglycerides 48; VLDL 10   CrCl cannot be calculated (Patient's most recent lab result is older than the maximum 21 days allowed.).   Wt Readings  from Last 3 Encounters:  03/17/17 132 lb (59.9 kg)  01/24/17 135 lb (61.2 kg)  01/16/17 130 lb 9.6 oz (59.2 kg)     Other studies reviewed: Additional studies/records reviewed today include: summarized above  ASSESSMENT AND PLAN:  1. Cryptogenic stroke     The patient was seen today in conjunction with Dr. Caryl Comes.  Rational of heart monitoring in the environment of stroke with embolic etiology suspected was discussed, I discussed the procedure, rsks/benefits with the patient and his wife.  At this time, they would like to see his neurologist with the 30 day monitor results as well as see Dr. Radford Pax get their opinions/thoughts, they have some concerns as well about financial aspect of the procedure/monthly monitoring and they will do some research into what thier responsibility will be.           Disposition: F/u with neurology, Dr.Turner, we will see him PRN, should he decide to  pursue ILR, they will call and can arrange with Dr.Klein.  Current medicines are reviewed at length with the patient today.  The patient did not have any concerns regarding medicines.  Venetia Night, PA-C 03/17/2017 2:28 PM     [address] [place] [address] [phone] (office)  [phone] (fax)

## 2017-03-17 ENCOUNTER — Ambulatory Visit (INDEPENDENT_AMBULATORY_CARE_PROVIDER_SITE_OTHER): Payer: PPO | Admitting: Physician Assistant

## 2017-03-17 VITALS — BP 124/70 | HR 77 | Ht 64.0 in | Wt 132.0 lb

## 2017-03-17 DIAGNOSIS — I63442 Cerebral infarction due to embolism of left cerebellar artery: Secondary | ICD-10-CM | POA: Diagnosis not present

## 2017-03-17 NOTE — Patient Instructions (Addendum)
Medication Instructions:   Your physician recommends that you continue on your current medications as directed. Please refer to the Current Medication list given to you today.   If you need a refill on your cardiac medications before your next appointment, please call your pharmacy.  Labwork: NONE ORDERED  TODAY    Testing/Procedures: NONE ORDERED  TODAY    Follow-Up:  AS SCHEDULED WITH DR TURNER    Any Other Special Instructions Will Be Listed Below (If Applicable).

## 2017-04-24 DIAGNOSIS — R351 Nocturia: Secondary | ICD-10-CM | POA: Diagnosis not present

## 2017-04-24 DIAGNOSIS — N401 Enlarged prostate with lower urinary tract symptoms: Secondary | ICD-10-CM | POA: Diagnosis not present

## 2017-05-15 DIAGNOSIS — G451 Carotid artery syndrome (hemispheric): Secondary | ICD-10-CM | POA: Diagnosis not present

## 2017-05-15 DIAGNOSIS — R413 Other amnesia: Secondary | ICD-10-CM | POA: Diagnosis not present

## 2017-05-15 DIAGNOSIS — R259 Unspecified abnormal involuntary movements: Secondary | ICD-10-CM | POA: Diagnosis not present

## 2017-05-15 DIAGNOSIS — I7771 Dissection of carotid artery: Secondary | ICD-10-CM | POA: Diagnosis not present

## 2017-06-03 DIAGNOSIS — R351 Nocturia: Secondary | ICD-10-CM | POA: Diagnosis not present

## 2017-06-03 DIAGNOSIS — N401 Enlarged prostate with lower urinary tract symptoms: Secondary | ICD-10-CM | POA: Diagnosis not present

## 2017-06-13 ENCOUNTER — Encounter (INDEPENDENT_AMBULATORY_CARE_PROVIDER_SITE_OTHER): Payer: Self-pay

## 2017-06-13 ENCOUNTER — Ambulatory Visit: Payer: PPO | Admitting: Cardiology

## 2017-06-13 ENCOUNTER — Encounter: Payer: Self-pay | Admitting: Cardiology

## 2017-06-13 VITALS — BP 158/52 | HR 75 | Ht 64.0 in | Wt 133.0 lb

## 2017-06-13 DIAGNOSIS — I6330 Cerebral infarction due to thrombosis of unspecified cerebral artery: Secondary | ICD-10-CM | POA: Diagnosis not present

## 2017-06-13 NOTE — Progress Notes (Signed)
Cardiology Office Note:    Date:  06/13/2017   ID:  [name] [name], DOB 1933/03/10, MRN [id number]  PCP:  [name], MD  Cardiologist:  Fransico Him, MD   Referring MD: [name], MD   Chief Complaint  Patient presents with  . Follow-up    CVA    History of Present Illness:    [name] [name] is a 83 y.o. male with a hx of HTN, HLD and AAA s/p repair, which is followed by Dr. Oneida Alar.  He was initially referred to Cardiology for 30 day event monitor was negative for arrhythmia and an ILR was discussed but patient wished to hold off.  TEE ws negative for source of emboli.  He is here to discuss possibly placing a loop recorder but does not want to do anything until next year. He is here today for followup and is doing well.  He denies any chest pain or pressure, SOB, DOE, PND, orthopnea, LE edema, dizziness, palpitations or syncope. He is compliant with his meds and is tolerating meds with no SE.      Past Medical History:  Diagnosis Date  . AAA (abdominal aortic aneurysm) ([place])   . 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 Diabetes mellitus without complication ([place])   . Hiatal hernia   . Hyperlipidemia     Past Surgical History:  Procedure Laterality Date  . ABDOMINAL AORTIC ANEURYSM REPAIR  10-2003   also had iliac aneurysm repair  . CHOLECYSTECTOMY     Laparoscopic cholecystectomy  . EYE SURGERY Bilateral    cataracts    Current Medications: Current Meds  Medication Sig  . aspirin 325 MG tablet Take 1 tablet (325 mg total) by mouth daily.  . cholecalciferol (VITAMIN D) 1000 units tablet Take 1,000 Units by mouth daily with supper.  . Coenzyme Q10 (COQ10) 100 MG CAPS Take 100 mg by mouth daily.  . diazepam (VALIUM) 5 MG tablet Take 2.5-5 mg by mouth at bedtime as needed for anxiety (restless legs).  Marland Kitchen levothyroxine (SYNTHROID, LEVOTHROID) 75 MCG tablet Take 75 mcg by mouth daily before breakfast.   . loratadine (CLARITIN) 10 MG tablet Take 10 mg by mouth  daily as needed for allergies.   . Magnesium 400 MG CAPS Take 400 mg by mouth daily.  . metFORMIN (GLUCOPHAGE) 1000 MG tablet Take 1,000 mg by mouth 2 (two) times daily.  . mirabegron ER (MYRBETRIQ) 25 MG TB24 tablet Take 25 mg by mouth daily.  . Multiple Vitamin (MULTIVITAMIN WITH MINERALS) TABS tablet Take 1 tablet by mouth daily.  . Omega-3 1000 MG CAPS Take 1,000 mg by mouth daily.  Marland Kitchen omeprazole (PRILOSEC) 20 MG capsule Take 20 mg by mouth at bedtime.   . rosuvastatin (CRESTOR) 10 MG tablet Take 10 mg by mouth at bedtime.   . sildenafil (REVATIO) 20 MG tablet Take 40-100 mg by mouth as needed. For sexual activity  . vitamin B-12 (CYANOCOBALAMIN) 1000 MCG tablet Take 1,000 mcg by mouth daily.     Allergies:   Actos [pioglitazone]; Allegra [fexofenadine]; Iodine; Metformin and related; Onglyza [saxagliptin]; and Shellfish allergy   Social History   Socioeconomic History  . Marital status: Married    Spouse name: None  . Number of children: None  . Years of education: None  . Highest education level: None  Social Needs  . Financial resource strain: None  . Food insecurity - worry: None  . Food insecurity - inability: None  . Transportation needs - medical: None  .  Transportation needs - non-medical: None  Occupational History  . None  Tobacco Use  . Smoking status: Never Smoker  . Smokeless tobacco: Never Used  Substance and Sexual Activity  . Alcohol use: No  . Drug use: No  . Sexual activity: None  Other Topics Concern  . None  Social History Narrative  . None     Family History: The patient's family history includes Arthritis in his mother.  ROS:   Please see the history of present illness.    ROS  All other systems reviewed and negative.   EKGs/Labs/Other Studies Reviewed:    The following studies were reviewed today: none  EKG:  EKG is not ordered today.  Recent Labs: 01/16/2017: ALT 18 01/17/2017: BUN 17; Creatinine, Ser 0.99; Hemoglobin 14.5;  Platelets 142; Potassium 4.0; Sodium 141   Recent Lipid Panel    Component Value Date/Time   CHOL 105 01/17/2017 0359   TRIG 48 01/17/2017 0359   HDL 40 (L) 01/17/2017 0359   CHOLHDL 2.6 01/17/2017 0359   VLDL 10 01/17/2017 0359   LDLCALC 55 01/17/2017 0359    Physical Exam:    VS:  BP (!) 158/52   Pulse 75   Ht 5\' 4"  (1.626 m)   Wt 133 lb (60.3 kg)   SpO2 97%   BMI 22.83 kg/m     Wt Readings from Last 3 Encounters:  06/13/17 133 lb (60.3 kg)  03/17/17 132 lb (59.9 kg)  01/24/17 135 lb (61.2 kg)     GEN:  Well nourished, well developed in no acute distress HEENT: Normal NECK: No JVD; No carotid bruits LYMPHATICS: No lymphadenopathy CARDIAC: RRR, no murmurs, rubs, gallops RESPIRATORY:  Clear to auscultation without rales, wheezing or rhonchi  ABDOMEN: Soft, non-tender, non-distended MUSCULOSKELETAL:  No edema; No deformity  SKIN: Warm and dry NEUROLOGIC:  Alert and oriented x 3 PSYCHIATRIC:  Normal affect   ASSESSMENT:    1. Cerebral thrombosis with cerebral infarction    PLAN:    In order of problems listed above:  1.  CVA - he has not had any further problems.  I have encouraged him to proceed with the ILR  But he does not want to proceed with it until early next year. I encouraged him to call Dr. Caryl Comes for an appt if he decides to proceed.  I will see him on a PRN basis.    Medication Adjustments/Labs and Tests Ordered: Current medicines are reviewed at length with the patient today.  Concerns regarding medicines are outlined above.  No orders of the defined types were placed in this encounter.  No orders of the defined types were placed in this encounter.   Signed, Fransico Him, MD  06/13/2017 9:17 AM    [place]

## 2017-06-13 NOTE — Patient Instructions (Signed)
Medication Instructions:  Your physician recommends that you continue on your current medications as directed. Please refer to the Current Medication list given to you today.   Labwork: None ordered  Testing/Procedures: None ordered  Follow-Up: Your physician wants you to follow-up AS NEEDED  Any Other Special Instructions Will Be Listed Below (If Applicable).     If you need a refill on your cardiac medications before your next appointment, please call your pharmacy.

## 2017-07-04 DIAGNOSIS — N401 Enlarged prostate with lower urinary tract symptoms: Secondary | ICD-10-CM | POA: Diagnosis not present

## 2017-07-04 DIAGNOSIS — R351 Nocturia: Secondary | ICD-10-CM | POA: Diagnosis not present

## 2017-07-29 DIAGNOSIS — H6121 Impacted cerumen, right ear: Secondary | ICD-10-CM | POA: Diagnosis not present

## 2017-07-29 DIAGNOSIS — H9190 Unspecified hearing loss, unspecified ear: Secondary | ICD-10-CM | POA: Diagnosis not present

## 2017-07-29 DIAGNOSIS — M542 Cervicalgia: Secondary | ICD-10-CM | POA: Diagnosis not present

## 2017-08-05 DIAGNOSIS — H6121 Impacted cerumen, right ear: Secondary | ICD-10-CM | POA: Diagnosis not present

## 2017-08-19 DIAGNOSIS — Z5181 Encounter for therapeutic drug level monitoring: Secondary | ICD-10-CM | POA: Diagnosis not present

## 2017-08-19 DIAGNOSIS — E039 Hypothyroidism, unspecified: Secondary | ICD-10-CM | POA: Diagnosis not present

## 2017-08-19 DIAGNOSIS — E119 Type 2 diabetes mellitus without complications: Secondary | ICD-10-CM | POA: Diagnosis not present

## 2017-08-19 DIAGNOSIS — Z9181 History of falling: Secondary | ICD-10-CM | POA: Diagnosis not present

## 2017-09-08 ENCOUNTER — Emergency Department (HOSPITAL_COMMUNITY): Payer: PPO

## 2017-09-08 ENCOUNTER — Encounter (HOSPITAL_COMMUNITY): Payer: Self-pay | Admitting: Emergency Medicine

## 2017-09-08 DIAGNOSIS — I63412 Cerebral infarction due to embolism of left middle cerebral artery: Secondary | ICD-10-CM | POA: Diagnosis not present

## 2017-09-08 DIAGNOSIS — Z91013 Allergy to seafood: Secondary | ICD-10-CM | POA: Diagnosis not present

## 2017-09-08 DIAGNOSIS — I10 Essential (primary) hypertension: Secondary | ICD-10-CM | POA: Diagnosis present

## 2017-09-08 DIAGNOSIS — I340 Nonrheumatic mitral (valve) insufficiency: Secondary | ICD-10-CM | POA: Diagnosis not present

## 2017-09-08 DIAGNOSIS — Z79890 Hormone replacement therapy (postmenopausal): Secondary | ICD-10-CM

## 2017-09-08 DIAGNOSIS — I351 Nonrheumatic aortic (valve) insufficiency: Secondary | ICD-10-CM | POA: Diagnosis not present

## 2017-09-08 DIAGNOSIS — Z8679 Personal history of other diseases of the circulatory system: Secondary | ICD-10-CM

## 2017-09-08 DIAGNOSIS — I6340 Cerebral infarction due to embolism of unspecified cerebral artery: Secondary | ICD-10-CM | POA: Diagnosis present

## 2017-09-08 DIAGNOSIS — I739 Peripheral vascular disease, unspecified: Secondary | ICD-10-CM | POA: Diagnosis not present

## 2017-09-08 DIAGNOSIS — I361 Nonrheumatic tricuspid (valve) insufficiency: Secondary | ICD-10-CM | POA: Diagnosis not present

## 2017-09-08 DIAGNOSIS — Z7982 Long term (current) use of aspirin: Secondary | ICD-10-CM | POA: Diagnosis not present

## 2017-09-08 DIAGNOSIS — I639 Cerebral infarction, unspecified: Secondary | ICD-10-CM | POA: Diagnosis not present

## 2017-09-08 DIAGNOSIS — R29700 NIHSS score 0: Secondary | ICD-10-CM | POA: Diagnosis present

## 2017-09-08 DIAGNOSIS — R479 Unspecified speech disturbances: Secondary | ICD-10-CM | POA: Diagnosis not present

## 2017-09-08 DIAGNOSIS — I6389 Other cerebral infarction: Secondary | ICD-10-CM | POA: Diagnosis not present

## 2017-09-08 DIAGNOSIS — Z7984 Long term (current) use of oral hypoglycemic drugs: Secondary | ICD-10-CM

## 2017-09-08 DIAGNOSIS — Z79899 Other long term (current) drug therapy: Secondary | ICD-10-CM | POA: Diagnosis not present

## 2017-09-08 DIAGNOSIS — R4789 Other speech disturbances: Secondary | ICD-10-CM | POA: Diagnosis present

## 2017-09-08 DIAGNOSIS — E1151 Type 2 diabetes mellitus with diabetic peripheral angiopathy without gangrene: Secondary | ICD-10-CM | POA: Diagnosis present

## 2017-09-08 DIAGNOSIS — R51 Headache: Secondary | ICD-10-CM | POA: Diagnosis not present

## 2017-09-08 DIAGNOSIS — E039 Hypothyroidism, unspecified: Secondary | ICD-10-CM | POA: Diagnosis present

## 2017-09-08 DIAGNOSIS — Z888 Allergy status to other drugs, medicaments and biological substances status: Secondary | ICD-10-CM | POA: Diagnosis not present

## 2017-09-08 DIAGNOSIS — Z91041 Radiographic dye allergy status: Secondary | ICD-10-CM | POA: Diagnosis not present

## 2017-09-08 DIAGNOSIS — I672 Cerebral atherosclerosis: Secondary | ICD-10-CM | POA: Diagnosis present

## 2017-09-08 DIAGNOSIS — R413 Other amnesia: Secondary | ICD-10-CM | POA: Diagnosis not present

## 2017-09-08 DIAGNOSIS — E785 Hyperlipidemia, unspecified: Secondary | ICD-10-CM | POA: Diagnosis present

## 2017-09-08 DIAGNOSIS — Z8673 Personal history of transient ischemic attack (TIA), and cerebral infarction without residual deficits: Secondary | ICD-10-CM | POA: Diagnosis not present

## 2017-09-08 DIAGNOSIS — R4182 Altered mental status, unspecified: Secondary | ICD-10-CM | POA: Diagnosis present

## 2017-09-08 LAB — DIFFERENTIAL
Basophils Absolute: 0 10*3/uL (ref 0.0–0.1)
Basophils Relative: 0 %
Eosinophils Absolute: 0.2 10*3/uL (ref 0.0–0.7)
Eosinophils Relative: 3 %
Lymphocytes Relative: 27 %
Lymphs Abs: 1.8 10*3/uL (ref 0.7–4.0)
Monocytes Absolute: 0.5 10*3/uL (ref 0.1–1.0)
Monocytes Relative: 8 %
Neutro Abs: 4.2 10*3/uL (ref 1.7–7.7)
Neutrophils Relative %: 62 %

## 2017-09-08 LAB — COMPREHENSIVE METABOLIC PANEL
ALT: 19 U/L (ref 17–63)
AST: 22 U/L (ref 15–41)
Albumin: 3.6 g/dL (ref 3.5–5.0)
Alkaline Phosphatase: 78 U/L (ref 38–126)
Anion gap: 9 (ref 5–15)
BUN: 25 mg/dL — ABNORMAL HIGH (ref 6–20)
CO2: 25 mmol/L (ref 22–32)
Calcium: 9.5 mg/dL (ref 8.9–10.3)
Chloride: 102 mmol/L (ref 101–111)
Creatinine, Ser: 1.02 mg/dL (ref 0.61–1.24)
GFR calc Af Amer: >60 mL/min (ref >60)
GFR calc non Af Amer: >60 mL/min (ref >60)
Glucose, Bld: 201 mg/dL — ABNORMAL HIGH (ref 65–99)
Potassium: 4.3 mmol/L (ref 3.5–5.1)
Sodium: 136 mmol/L (ref 135–145)
Total Bilirubin: 0.6 mg/dL (ref 0.3–1.2)
Total Protein: 6.5 g/dL (ref 6.5–8.1)

## 2017-09-08 LAB — I-STAT CHEM 8, ED
BUN: 30 mg/dL — ABNORMAL HIGH (ref 6–20)
Calcium, Ion: 1.24 mmol/L (ref 1.15–1.40)
Chloride: 100 mmol/L — ABNORMAL LOW (ref 101–111)
Creatinine, Ser: 1.1 mg/dL (ref 0.61–1.24)
Glucose, Bld: 197 mg/dL — ABNORMAL HIGH (ref 65–99)
HCT: 38 % — ABNORMAL LOW (ref 39.0–52.0)
Hemoglobin: 12.9 g/dL — ABNORMAL LOW (ref 13.0–17.0)
Potassium: 4.2 mmol/L (ref 3.5–5.1)
Sodium: 138 mmol/L (ref 135–145)
TCO2: 28 mmol/L (ref 22–32)

## 2017-09-08 LAB — CBC
HCT: 39.5 % (ref 39.0–52.0)
Hemoglobin: 13.7 g/dL (ref 13.0–17.0)
MCH: 32.6 pg (ref 26.0–34.0)
MCHC: 34.7 g/dL (ref 30.0–36.0)
MCV: 94 fL (ref 78.0–100.0)
Platelets: 171 10*3/uL (ref 150–400)
RBC: 4.2 MIL/uL — ABNORMAL LOW (ref 4.22–5.81)
RDW: 12.7 % (ref 11.5–15.5)
WBC: 6.8 10*3/uL (ref 4.0–10.5)

## 2017-09-08 LAB — PROTIME-INR
INR: 1.05
Prothrombin Time: 13.6 seconds (ref 11.4–15.2)

## 2017-09-08 LAB — I-STAT TROPONIN, ED: Troponin i, poc: 0 ng/mL (ref 0.00–0.08)

## 2017-09-08 LAB — CBG MONITORING, ED: Glucose-Capillary: 198 mg/dL — ABNORMAL HIGH (ref 65–99)

## 2017-09-08 LAB — APTT: aPTT: 31 seconds (ref 24–36)

## 2017-09-08 NOTE — ED Notes (Signed)
CBG 198 

## 2017-09-08 NOTE — ED Triage Notes (Signed)
Pt BIB family, family states pt unable to recall names starting at 1830 today. No other neuro deficits noted. Pt able to recall wife and daughter's name at this time. Hx TIA. Dr. Venora Maples aware.

## 2017-09-09 ENCOUNTER — Observation Stay (HOSPITAL_COMMUNITY): Payer: PPO

## 2017-09-09 ENCOUNTER — Other Ambulatory Visit: Payer: Self-pay

## 2017-09-09 ENCOUNTER — Encounter (HOSPITAL_COMMUNITY): Payer: Self-pay | Admitting: Family Medicine

## 2017-09-09 ENCOUNTER — Emergency Department (HOSPITAL_COMMUNITY): Payer: PPO

## 2017-09-09 ENCOUNTER — Inpatient Hospital Stay (HOSPITAL_COMMUNITY)
Admission: EM | Admit: 2017-09-09 | Discharge: 2017-09-10 | DRG: 042 | Disposition: A | Discharge status: Home / Self Care | Payer: PPO | Attending: Internal Medicine | Admitting: Internal Medicine

## 2017-09-09 DIAGNOSIS — I739 Peripheral vascular disease, unspecified: Secondary | ICD-10-CM | POA: Diagnosis present

## 2017-09-09 DIAGNOSIS — Z888 Allergy status to other drugs, medicaments and biological substances status: Secondary | ICD-10-CM | POA: Diagnosis not present

## 2017-09-09 DIAGNOSIS — I351 Nonrheumatic aortic (valve) insufficiency: Secondary | ICD-10-CM | POA: Diagnosis not present

## 2017-09-09 DIAGNOSIS — I10 Essential (primary) hypertension: Secondary | ICD-10-CM | POA: Diagnosis present

## 2017-09-09 DIAGNOSIS — I639 Cerebral infarction, unspecified: Secondary | ICD-10-CM

## 2017-09-09 DIAGNOSIS — E119 Type 2 diabetes mellitus without complications: Secondary | ICD-10-CM

## 2017-09-09 DIAGNOSIS — I672 Cerebral atherosclerosis: Secondary | ICD-10-CM | POA: Diagnosis present

## 2017-09-09 DIAGNOSIS — R4182 Altered mental status, unspecified: Secondary | ICD-10-CM | POA: Diagnosis present

## 2017-09-09 DIAGNOSIS — R4789 Other speech disturbances: Secondary | ICD-10-CM | POA: Diagnosis present

## 2017-09-09 DIAGNOSIS — Z79890 Hormone replacement therapy (postmenopausal): Secondary | ICD-10-CM | POA: Diagnosis not present

## 2017-09-09 DIAGNOSIS — I361 Nonrheumatic tricuspid (valve) insufficiency: Secondary | ICD-10-CM | POA: Diagnosis not present

## 2017-09-09 DIAGNOSIS — E1151 Type 2 diabetes mellitus with diabetic peripheral angiopathy without gangrene: Secondary | ICD-10-CM

## 2017-09-09 DIAGNOSIS — E785 Hyperlipidemia, unspecified: Secondary | ICD-10-CM | POA: Diagnosis present

## 2017-09-09 DIAGNOSIS — R29700 NIHSS score 0: Secondary | ICD-10-CM | POA: Diagnosis present

## 2017-09-09 DIAGNOSIS — Z8679 Personal history of other diseases of the circulatory system: Secondary | ICD-10-CM | POA: Diagnosis not present

## 2017-09-09 DIAGNOSIS — Z91041 Radiographic dye allergy status: Secondary | ICD-10-CM | POA: Diagnosis not present

## 2017-09-09 DIAGNOSIS — Z91013 Allergy to seafood: Secondary | ICD-10-CM | POA: Diagnosis not present

## 2017-09-09 DIAGNOSIS — Z8673 Personal history of transient ischemic attack (TIA), and cerebral infarction without residual deficits: Secondary | ICD-10-CM | POA: Diagnosis not present

## 2017-09-09 DIAGNOSIS — I6340 Cerebral infarction due to embolism of unspecified cerebral artery: Secondary | ICD-10-CM | POA: Diagnosis present

## 2017-09-09 DIAGNOSIS — Z79899 Other long term (current) drug therapy: Secondary | ICD-10-CM | POA: Diagnosis not present

## 2017-09-09 DIAGNOSIS — I63412 Cerebral infarction due to embolism of left middle cerebral artery: Secondary | ICD-10-CM | POA: Diagnosis not present

## 2017-09-09 DIAGNOSIS — E039 Hypothyroidism, unspecified: Secondary | ICD-10-CM | POA: Diagnosis present

## 2017-09-09 DIAGNOSIS — Z7982 Long term (current) use of aspirin: Secondary | ICD-10-CM | POA: Diagnosis not present

## 2017-09-09 DIAGNOSIS — I6389 Other cerebral infarction: Secondary | ICD-10-CM | POA: Diagnosis not present

## 2017-09-09 DIAGNOSIS — I340 Nonrheumatic mitral (valve) insufficiency: Secondary | ICD-10-CM | POA: Diagnosis not present

## 2017-09-09 DIAGNOSIS — Z7984 Long term (current) use of oral hypoglycemic drugs: Secondary | ICD-10-CM | POA: Diagnosis not present

## 2017-09-09 HISTORY — DX: Cerebral infarction, unspecified: I63.9

## 2017-09-09 HISTORY — DX: Unspecified hearing loss, unspecified ear: H91.90

## 2017-09-09 LAB — GLUCOSE, CAPILLARY
Glucose-Capillary: 248 mg/dL — ABNORMAL HIGH (ref 65–99)
Glucose-Capillary: 193 mg/dL — ABNORMAL HIGH (ref 65–99)
Glucose-Capillary: 199 mg/dL — ABNORMAL HIGH (ref 65–99)

## 2017-09-09 LAB — CBG MONITORING, ED: Glucose-Capillary: 183 mg/dL — ABNORMAL HIGH (ref 65–99)

## 2017-09-09 MED ORDER — ACETAMINOPHEN 325 MG PO TABS: 650.0000 mg | ORAL_TABLET | ORAL | Status: DC | PRN

## 2017-09-09 MED ORDER — ROSUVASTATIN CALCIUM 5 MG PO TABS
10.0000 mg | ORAL_TABLET | Freq: Every day | ORAL | Status: DC
  Administered 2017-09-09: 10 mg via ORAL
  Filled 2017-09-09: qty 2

## 2017-09-09 MED ORDER — ENOXAPARIN SODIUM 40 MG/0.4ML ~~LOC~~ SOLN
40.0000 mg | SUBCUTANEOUS | Status: DC
  Administered 2017-09-09: 40 mg via SUBCUTANEOUS
  Filled 2017-09-09 (×2): qty 0.4

## 2017-09-09 MED ORDER — OMEGA-3-ACID ETHYL ESTERS 1 G PO CAPS
1.0000 g | ORAL_CAPSULE | Freq: Every day | ORAL | Status: DC
  Administered 2017-09-09: 1 g via ORAL
  Filled 2017-09-09 (×2): qty 1

## 2017-09-09 MED ORDER — DIAZEPAM 5 MG PO TABS: 2.5000 mg | ORAL_TABLET | Freq: Every evening | ORAL | Status: DC | PRN

## 2017-09-09 MED ORDER — INSULIN ASPART 100 UNIT/ML ~~LOC~~ SOLN: 0.0000 [IU] | Freq: Every day | SUBCUTANEOUS | Status: DC

## 2017-09-09 MED ORDER — INSULIN ASPART 100 UNIT/ML ~~LOC~~ SOLN
0.0000 [IU] | Freq: Three times a day (TID) | SUBCUTANEOUS | Status: DC
  Administered 2017-09-09: 2 [IU] via SUBCUTANEOUS
  Administered 2017-09-09: 3 [IU] via SUBCUTANEOUS
  Administered 2017-09-09 – 2017-09-10 (×2): 2 [IU] via SUBCUTANEOUS
  Filled 2017-09-09: qty 1

## 2017-09-09 MED ORDER — VITAMIN D 1000 UNITS PO TABS
1000.0000 [IU] | ORAL_TABLET | Freq: Every day | ORAL | Status: DC
  Administered 2017-09-09: 1000 [IU] via ORAL
  Filled 2017-09-09: qty 1

## 2017-09-09 MED ORDER — SODIUM CHLORIDE 0.9 % IV SOLN
INTRAVENOUS | Status: DC
  Administered 2017-09-09: 23:00:00 via INTRAVENOUS

## 2017-09-09 MED ORDER — ADULT MULTIVITAMIN W/MINERALS CH
1.0000 | ORAL_TABLET | Freq: Every day | ORAL | Status: DC
  Administered 2017-09-09: 1 via ORAL
  Filled 2017-09-09: qty 1

## 2017-09-09 MED ORDER — CLOPIDOGREL BISULFATE 75 MG PO TABS
75.0000 mg | ORAL_TABLET | Freq: Every day | ORAL | Status: DC
  Administered 2017-09-09: 75 mg via ORAL
  Filled 2017-09-09: qty 1

## 2017-09-09 MED ORDER — INSULIN GLARGINE 100 UNIT/ML ~~LOC~~ SOLN
10.0000 [IU] | SUBCUTANEOUS | Status: DC
  Administered 2017-09-09: 10 [IU] via SUBCUTANEOUS
  Filled 2017-09-09 (×3): qty 0.1

## 2017-09-09 MED ORDER — STROKE: EARLY STAGES OF RECOVERY BOOK
Freq: Once | Status: DC
  Filled 2017-09-09: qty 1

## 2017-09-09 MED ORDER — SENNOSIDES-DOCUSATE SODIUM 8.6-50 MG PO TABS: 1.0000 | ORAL_TABLET | Freq: Every evening | ORAL | Status: DC | PRN

## 2017-09-09 MED ORDER — ACETAMINOPHEN 650 MG RE SUPP: 650.0000 mg | RECTAL | Status: DC | PRN

## 2017-09-09 MED ORDER — PANTOPRAZOLE SODIUM 40 MG PO TBEC
40.0000 mg | DELAYED_RELEASE_TABLET | Freq: Every day | ORAL | Status: DC
  Administered 2017-09-09: 40 mg via ORAL
  Filled 2017-09-09: qty 1

## 2017-09-09 MED ORDER — ASPIRIN 325 MG PO TABS
325.0000 mg | ORAL_TABLET | Freq: Every day | ORAL | Status: DC
  Administered 2017-09-09: 325 mg via ORAL
  Filled 2017-09-09: qty 1

## 2017-09-09 MED ORDER — SODIUM CHLORIDE 0.9 % IV SOLN
INTRAVENOUS | Status: AC
  Administered 2017-09-09 (×2): via INTRAVENOUS

## 2017-09-09 MED ORDER — ACETAMINOPHEN 160 MG/5ML PO SOLN: 650.0000 mg | ORAL | Status: DC | PRN

## 2017-09-09 MED ORDER — ZOLPIDEM TARTRATE 5 MG PO TABS
5.0000 mg | ORAL_TABLET | Freq: Every evening | ORAL | Status: DC | PRN
Start: 2017-09-09 — End: 2017-09-10
  Administered 2017-09-09: 5 mg via ORAL
  Filled 2017-09-09: qty 1

## 2017-09-09 MED ORDER — VITAMIN B-12 1000 MCG PO TABS
1000.0000 ug | ORAL_TABLET | Freq: Every day | ORAL | Status: DC
  Administered 2017-09-09: 1000 ug via ORAL
  Filled 2017-09-09 (×2): qty 1

## 2017-09-09 MED ORDER — LEVOTHYROXINE SODIUM 75 MCG PO TABS
75.0000 ug | ORAL_TABLET | Freq: Every day | ORAL | Status: DC
  Administered 2017-09-09: 75 ug via ORAL
  Filled 2017-09-09 (×3): qty 1

## 2017-09-09 MED ORDER — OMEGA-3 1000 MG PO CAPS: 1000.0000 mg | ORAL_CAPSULE | Freq: Every day | ORAL | Status: DC

## 2017-09-09 MED ORDER — LORATADINE 10 MG PO TABS: 10.0000 mg | ORAL_TABLET | Freq: Every day | ORAL | Status: DC | PRN

## 2017-09-09 NOTE — ED Notes (Signed)
Patient transported to MRI 

## 2017-09-09 NOTE — ED Provider Notes (Addendum)
[facility] EMERGENCY DEPARTMENT Provider Note   CSN: [id number] Arrival date & time: 09/08/17  2045     History   Chief Complaint Chief Complaint  Patient presents with  . Altered Mental Status    HPI [name] [name] is a 84 y.o. male.  HPI  This is an 84 year old male with a history of AAA, diabetes, hyperlipidemia, CVA who presents with memory deficits.  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  Patient states that he was aware he was having difficulty coming up with names.  He is unable to tell me whether he thinks this was true memory loss versus word finding difficulty.  He does have a history of presentation with expressive a aphasia resulting in a positive MRI for CVA.  He takes daily aspirin.  Patient denies any other strokelike symptoms including facial droop, weakness, numbness, tingling, difficulty ambulating.  Wife states that "it has gotten a little bit better but he still does not remember and cannot come up with some names that he should be able to."  No recent illnesses or fevers.  Chart reviewed.  Full stroke stroke workup July 2018.  He did have a carotid abnormality on CTA of the neck which was concerning for small dissection versus thrombus.  MRI was also positive.  Past Medical History:  Diagnosis Date  . AAA (abdominal aortic aneurysm) ([place])   . 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 Diabetes mellitus without complication ([place])   . Hiatal hernia   . Hyperlipidemia     Patient Active Problem List   Diagnosis Date Noted  . Cerebral thrombosis with cerebral infarction 01/17/2017  . Aphasia   . Hyperlipidemia   . Essential hypertension   . Expressive aphasia 01/16/2017  . Diabetes mellitus ([place]) 01/16/2017  . 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 Aftercare following surgery of  the circulatory system, [place] 12/09/2013  . Abdominal aneurysm without mention of rupture 12/09/2013  . PAD (peripheral artery disease) ([place]) 04/14/2013    Past Surgical History:  Procedure Laterality Date  . ABDOMINAL AORTIC ANEURYSM REPAIR  10-2003   also had iliac aneurysm repair  . CHOLECYSTECTOMY     Laparoscopic cholecystectomy  . EYE SURGERY Bilateral    cataracts       Home Medications    Prior to Admission medications   Medication Sig Start Date End Date Taking? Authorizing Provider  aspirin 325 MG tablet Take 1 tablet (325 mg total) by mouth daily. 01/18/17  Yes Verner Mould, MD  cholecalciferol (VITAMIN D) 1000 units tablet Take 1,000 Units by mouth daily with supper.   Yes [provider]  Coenzyme Q10 (COQ10) 100 MG CAPS Take 100 mg by mouth daily.   Yes [provider]  diazepam (VALIUM) 5 MG tablet Take 2.5-5 mg by mouth at bedtime as needed for anxiety (restless legs).   Yes [provider]  levothyroxine (SYNTHROID, LEVOTHROID) 75 MCG tablet Take 75 mcg by mouth daily before breakfast.  09/15/13  Yes [provider]  loratadine (CLARITIN) 10 MG tablet Take 10 mg by mouth daily as needed for allergies.    Yes [provider]  metFORMIN (GLUCOPHAGE) 1000 MG tablet Take 1,000 mg by mouth 2 (two) times daily. 12/08/13  Yes [provider]  Multiple Vitamin (MULTIVITAMIN WITH MINERALS) TABS tablet Take  1 tablet by mouth daily.   Yes [provider]  Omega-3 1000 MG CAPS Take 1,000 mg by mouth daily.   Yes [provider]  omeprazole (PRILOSEC) 20 MG capsule Take 20 mg by mouth at bedtime.    Yes [provider]  rosuvastatin (CRESTOR) 10 MG tablet Take 10 mg by mouth at bedtime.    Yes [provider]  sildenafil (REVATIO) 20 MG tablet Take 40-100 mg by mouth as needed. For sexual activity 10/31/16  Yes [provider]  vitamin B-12 (CYANOCOBALAMIN) 1000 MCG tablet  Take 1,000 mcg by mouth daily.   Yes [provider]    Family History Family History  Problem Relation Age of Onset  . Arthritis Mother        Rheumatoid arthritis    Social History Social History   Tobacco Use  . Smoking status: Never Smoker  . Smokeless tobacco: Never Used  Substance Use Topics  . Alcohol use: No  . Drug use: No     Allergies   Actos [pioglitazone]; Allegra [fexofenadine]; Iodine; Metformin and related; Onglyza [saxagliptin]; and Shellfish allergy   Review of Systems Review of Systems  Constitutional: Negative for fever.  Respiratory: Negative for shortness of breath.   Cardiovascular: Negative for chest pain.  Gastrointestinal: Negative for abdominal pain, nausea and vomiting.  Neurological: Positive for speech difficulty and headaches. Negative for dizziness, weakness and numbness.  All other systems reviewed and are negative.    Physical Exam Updated Vital Signs BP (!) 159/77   Pulse 72   Temp (!) 97.5 F (36.4 C) (Oral)   Resp 17   Ht 5\' 4"  (1.626 m)   Wt 56.7 kg (125 lb)   SpO2 94%   BMI 21.46 kg/m   Physical Exam  Constitutional: He is oriented to person, place, and time. No distress.  HENT:  Head: Normocephalic and atraumatic.  Eyes: EOM are normal. Pupils are equal, round, and reactive to light.  Cardiovascular: Normal rate, regular rhythm and normal heart sounds.  No murmur heard. Pulmonary/Chest: Effort normal and breath sounds normal. No respiratory distress. He has no wheezes.  Abdominal: Soft. Bowel sounds are normal. There is no tenderness. There is no rebound.  Musculoskeletal: He exhibits no edema.  Neurological: He is alert and oriented to person, place, and time.  Cranial nerves II through XII intact, fluent speech, patient can name and repeat, he is oriented x3, 5 out of 5 strength in all 4 extremities, no dysmetria to finger-nose-finger  Skin: Skin is warm and dry.  Psychiatric: He has a normal mood and  affect.  Nursing note and vitals reviewed.    ED Treatments / Results  Labs (all labs ordered are listed, but only abnormal results are displayed) Labs Reviewed  CBC - Abnormal; Notable for the following components:      Result Value   RBC 4.20 (*)    All other components within normal limits  COMPREHENSIVE METABOLIC PANEL - Abnormal; Notable for the following components:   Glucose, Bld 201 (*)    BUN 25 (*)    All other components within normal limits  CBG MONITORING, ED - Abnormal; Notable for the following components:   Glucose-Capillary 198 (*)    All other components within normal limits  I-STAT CHEM 8, ED - Abnormal; Notable for the following components:   Chloride 100 (*)    BUN 30 (*)    Glucose, Bld 197 (*)    Hemoglobin 12.9 (*)  HCT 38.0 (*)    All other components within normal limits  PROTIME-INR  APTT  DIFFERENTIAL  I-STAT TROPONIN, ED    EKG  EKG Interpretation  Date/Time:  Monday September 08 2017 21:07:11 EDT Ventricular Rate:  71 PR Interval:    QRS Duration: 109 QT Interval:  385 QTC Calculation: 419 R Axis:   55 Text Interpretation:  Sinus rhythm Nonspecific T abnormalities, lateral leads Baseline wander in lead(s) I III aVL V5 No significant change since last tracing Confirmed by Thayer Jew [phone]) on 09/09/2017 2:38:17 AM       Radiology Ct Head Wo Contrast  Result Date: 09/08/2017 CLINICAL DATA:  Memory loss EXAM: CT HEAD WITHOUT CONTRAST TECHNIQUE: Contiguous axial images were obtained from the base of the skull through the vertex without intravenous contrast. COMPARISON:  01/16/2017 FINDINGS: Brain: Mild atrophic changes are noted. No findings to suggest acute hemorrhage, acute infarction or space-occupying mass lesion is seen. Vascular: No hyperdense vessel or unexpected calcification. Skull: Normal. Negative for fracture or focal lesion. Sinuses/Orbits: No acute finding. Other: None. IMPRESSION: Mild atrophic changes stable from the  prior exam. No acute abnormality noted. Electronically Signed   By: Inez Catalina M.D.   On: 09/08/2017 21:21   Mr Brain Wo Contrast  Result Date: 09/09/2017 CLINICAL DATA:  84 y/o  M; memory problems at 18:30 on 09/08/2017. EXAM: MRI HEAD WITHOUT CONTRAST TECHNIQUE: Multiplanar, multiecho pulse sequences of the brain and surrounding structures were obtained without intravenous contrast. COMPARISON:  01/16/2017 MRI of the head.  09/08/2017 CT of the head. FINDINGS: Brain: Punctate foci of reduced diffusion are present in the left inferior medial frontal lobe (series 3, image 21) and left posterior temporal lobes (series 3, image 25) compatible with acute/early subacute infarctions. No hemorrhage or mass effect. 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 Severalnonspecific foci of T2 FLAIR hyperintense signal abnormality in subcortical and periventricular white matter as well as pons are compatible withmoderatechronic microvascular ischemic changes for age. Moderatebrain parenchymal volume loss. Vascular: Normal flow voids. Skull and upper cervical spine: Normal marrow signal. Sinuses/Orbits: No acute findings. Bilateral intra-ocular lens replacement. Other: None. IMPRESSION: 1. Punctate foci of acute/early subacute infarction in left inferomedial frontal lobe and left posterior temporal lobe. No hemorrhage or mass effect. 2. Moderate chronic microvascular ischemic changes and parenchymal volume loss of the brain. These results were called by telephone at the time of interpretation on 09/09/2017 at 5:37 am to Dr. Thayer Jew , who verbally acknowledged these results. Electronically Signed   By: Kristine Garbe M.D.   On: 09/09/2017 05:42    Procedures Procedures (including critical care time)  Medications Ordered in ED Medications - No data to display   Initial Impression / Assessment and Plan / ED Course  I have reviewed the triage vital signs and the  nursing notes.  Pertinent labs & imaging results that were available during my care of the patient were reviewed by me and considered in my medical decision making (see chart for details).     Patient presents with word finding difficulty/memory issues.  Fairly acute in onset at 6:30 PM.  On my evaluation he is greater than 6 hours out from symptoms.  He is fluent and can name and repeat.  Otherwise his neurologic exam is reassuring.  Vital signs are reassuring.  Initial stroke workup sent from triage reviewed and largely reassuring.  He does seem to have issues with recall of familiar names.  On exam it appears to  be word finding and not necessarily memory.  He does have a history of TIA/CVA and is on aspirin.  Will obtain MRI.  5:56 AM MRI with 2 acute/subacute punctate infarcts.  Discussed with Dr. Lorraine Lax.  He recommends admission for further evaluation.  Patient and his wife updated at the bedside.  Final Clinical Impressions(s) / ED Diagnoses   Final diagnoses:  Word finding difficulty  Cerebrovascular accident (CVA), unspecified mechanism [facility])    ED Discharge Orders    None       Horton, Barbette Hair, MD 09/09/17 0454    Merryl Hacker, MD 09/09/17 [phone]

## 2017-09-09 NOTE — Progress Notes (Signed)
PT Cancellation Note  Patient Details Name: [name] [name] MRN: [id number] DOB: [birth year]   Cancelled Treatment:    Reason Eval/Treat Not Completed: Other (comment).  Pt was attempted to be seen several times, was in transition of rooms and now is eating lunch.  Will try later as time and pt allow.   Ramond Dial 09/09/2017, 2:11 PM   Mee Hives, PT MS Acute Rehab Dept. Number: [place]

## 2017-09-09 NOTE — Progress Notes (Addendum)
NEUROHOSPITALISTS STROKE TEAM - DAILY PROGRESS NOTE   ADMISSION HISTORY: [name] [name] is an 84 y.o. male past medical history of diabetes, hyperlipidemia, AA aneurysm, TIA presents with sudden onset difficulty remembering names noticed by his wife for on 6 PM last night. She called her daughter who brought the patient to [facility] ER. He waited through triage for 6 hours, initially thought to be TIA. MRI brain was performed which showed 2 punctate infarcts in the left ACA and MCA territory and MRA brain showing intracranial atherosclerotic disease. Neurology was consulted for further workup. Patient did have 30 day monitor placed after TIA 6 months ago that did not show atrial fibrillation.  Date last known well: 3.11/19 Time last known well: 6 pm tPA Given: no, mild symptoms NIHSS: 0 Baseline MRS 0  SUBJECTIVE (INTERVAL HISTORY) Wife and daughter are at the bedside. Patient is found laying in bed in NAD. Overall he feels his condition is completely resolved. Voices no new complaints. No new events reported overnight.   Laboratory Results  CBC:  Recent Labs  Lab 09/08/17 2111 09/08/17 2130  WBC 6.8  --   HGB 13.7 12.9*  HCT 39.5 38.0*  MCV 94.0  --   PLT 171  --    BMP: Recent Labs  Lab 09/08/17 2111 09/08/17 2130  NA 136 138  K 4.3 4.2  CL 102 100*  CO2 25  --   GLUCOSE 201* 197*  BUN 25* 30*  CREATININE 1.02 1.10  CALCIUM 9.5  --    Coagulation Studies:  Recent Labs    09/08/17 2111  APTT 31  INR 1.05   Urine Drug Screen:     Component Value Date/Time   LABOPIA NONE DETECTED 01/16/2017 1618   COCAINSCRNUR NONE DETECTED 01/16/2017 1618   LABBENZ NONE DETECTED 01/16/2017 1618   AMPHETMU NONE DETECTED 01/16/2017 1618   THCU NONE DETECTED 01/16/2017 1618   LABBARB NONE DETECTED 01/16/2017 1618    Alcohol Level: No results for input(s): ETH in the last 168 hours.  Physical Examination   Vitals:   09/09/17 0900 09/09/17 0930 09/09/17 1016 09/09/17 1107  BP: (!) 144/81 (!) 168/83 (!) 154/71 138/81  Pulse: 65 80 77 76  Resp: 15 19 16 16   Temp:    98 F (36.7 C)  TempSrc:    Oral  SpO2: 94% 96% 97% 97%  Weight:    57.2 kg (126 lb 1.7 oz)  Height:    5\' 3"  (1.6 m)   General - Well nourished, well developed, in no apparent distress HEENT-  Normocephalic,  Cardiovascular - Regular rate and rhythm  Respiratory - Lungs clear bilaterally. No wheezing. Abdomen - soft and non-tender, BS normal Extremities- no edema or cyanosis  Neurological Examination  Mental Status: Alert, oriented, thought content appropriate.  Speech fluent without evidence of aphasia. Able to follow 3 step commands without difficulty. Cranial Nerves: II: Visual fields grossly normal,  III,IV, VI: ptosis not present, extra-ocular motions intact bilaterally, pupils equal, round, reactive to light and accommodation V,VII: smile symmetric, facial light touch sensation normal bilaterally VIII: hearing normal bilaterally IX,X: uvula rises symmetrically XI: bilateral shoulder shrug XII: midline tongue extension Motor: Right : Upper extremity   5/5    Left:     Upper extremity   5/5  Lower extremity   5/5     Lower extremity   5/5 Tone and bulk:normal tone throughout; no atrophy noted Sensory: Pinprick and light touch intact throughout, bilaterally Deep Tendon Reflexes:  2+ and symmetric throughout Plantars: Right: downgoing   Left: downgoing Cerebellar: normal finger-to-nose, normal rapid alternating movements and normal heel-to-shin test Gait: normal gait and station  Imaging Results  Ct Head Wo Contrast  Result Date: 09/08/2017 CLINICAL DATA:  Memory loss EXAM: CT HEAD WITHOUT CONTRAST TECHNIQUE: Contiguous axial images were obtained from the base of the skull through the vertex without intravenous contrast. COMPARISON:  01/16/2017 FINDINGS: Brain: Mild atrophic changes are noted. No findings to suggest acute  hemorrhage, acute infarction or space-occupying mass lesion is seen. Vascular: No hyperdense vessel or unexpected calcification. Skull: Normal. Negative for fracture or focal lesion. Sinuses/Orbits: No acute finding. Other: None. IMPRESSION: Mild atrophic changes stable from the prior exam. No acute abnormality noted. Electronically Signed   By: Inez Catalina M.D.   On: 09/08/2017 21:21   Mr Brain Wo Contrast  Result Date: 09/09/2017 CLINICAL DATA:  84 y/o  M; memory problems at 18:30 on 09/08/2017. EXAM: MRI HEAD WITHOUT CONTRAST TECHNIQUE: Multiplanar, multiecho pulse sequences of the brain and surrounding structures were obtained without intravenous contrast. COMPARISON:  01/16/2017 MRI of the head.  09/08/2017 CT of the head. FINDINGS: Brain: Punctate foci of reduced diffusion are present in the left inferior medial frontal lobe (series 3, image 21) and left posterior temporal lobes (series 3, image 25) compatible with acute/early subacute infarctions. No hemorrhage or mass effect. 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 Severalnonspecific foci of T2 FLAIR hyperintense signal abnormality in subcortical and periventricular white matter as well as pons are compatible withmoderatechronic microvascular ischemic changes for age. Moderatebrain parenchymal volume loss. Vascular: Normal flow voids. Skull and upper cervical spine: Normal marrow signal. Sinuses/Orbits: No acute findings. Bilateral intra-ocular lens replacement. Other: None. IMPRESSION: 1. Punctate foci of acute/early subacute infarction in left inferomedial frontal lobe and left posterior temporal lobe. No hemorrhage or mass effect. 2. Moderate chronic microvascular ischemic changes and parenchymal volume loss of the brain. These results were called by telephone at the time of interpretation on 09/09/2017 at 5:37 am to Dr. Thayer Jew , who verbally acknowledged these results. Electronically Signed   By:  Kristine Garbe M.D.   On: 09/09/2017 05:42   [name] Head Wo Contrast  Result Date: 09/09/2017 CLINICAL DATA:  Speech disturbance. Punctate acute infarctions in the left brain as shown on previous parenchymal imaging. EXAM: MRA HEAD WITHOUT CONTRAST TECHNIQUE: Angiographic images of the Circle of Willis were obtained using MRA technique without intravenous contrast. COMPARISON:  MRI brain earlier same day.  CT 09/08/2017. FINDINGS: Internal carotid arteries are somewhat ectatic but widely patent through the brain and siphon regions. The anterior and middle cerebral vessels are patent without proximal stenosis, aneurysm or vascular malformation. More distal branch vessels show mild atherosclerotic irregularity. Right PCA takes a fetal origin from the anterior circulation in that vessel also shows some distal vessel atherosclerotic irregularity. Both vertebral arteries are widely patent to the basilar. No basilar stenosis. Posterior circulation branch vessels are patent. As noted above, right PCA originates from the anterior circulation and left PCA originates from the basilar tip. Both PCAs show some distal vessel atherosclerotic irregularity. IMPRESSION: No large or medium vessel occlusion or correctable proximal stenosis. Widespread distal vessel narrowing and irregularity consistent with intracranial atherosclerotic disease. Electronically Signed   By: Nelson Chimes M.D.   On: 09/09/2017 07:53   B/L Carotid U/S:  No evidence of a significant stenosis, 1-39%.          TEE /Loop Recorder:  PENDING  Echocardiogram:   PENDING  IMPRESSION AND PLAN  Mr. [name] [name] is a 84 y.o. male with PMH of diabetes, hyperlipidemia, AA aneurysm, TIA presents with sudden onset difficulty remembering names.   Stroke - left MCA two punctate infarcts - embolic pattern - etiology unclear   Resultant back to baseline  CT head - no acute abnormality  MRI left MCA territory 2 punctate cortical  infarcts  MRA head no LVO, diffuse intracranial atherosclerosis  Carotid Doppler unremarkable  TTE pending  Recommend TEE and loop recorder for further cardioembolic evaluation  LDL - pending  HgbA1c - pending  VTE prophylaxis - Lovenox  Fall precautions  DIET - heart healthy diet, thin liquid  Aspirin PTA, currently on aspirin and Plavix. Continue DAPT for 3 weeks and then plavix alone  Ongoing aggressive stroke risk factor management  Therapy recommendations:  pending  Disposition:  pending  History of stroke  12/2016 episodic aphasia, MRI showed left cerebellum punctate infarct EF 60-65%.  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  LDL 55, 30-day cardio event monitor no A. fib.  On aspirin Lipitor  Hypertension  BP stable  Long-term BP goal normotensive  Hyperlipidemia  Home meds: Crestor 10  LDL pending, goal < 70  Continue Crestor 10  Resume statin at discharge  DM  HbA1C pending, goal < 7.0  SSI  CBG monitoring  Other Stroke Risk Factors  Advanced age  PVD  OSA  Other active issue  AAA  Memory difficulty   Rosalin Hawking, MD PhD Stroke Neurology 09/09/2017 6:50 PM    To contact Stroke Provider, please refer to [email or web address] After hours, contact General Neurology

## 2017-09-09 NOTE — Consult Note (Signed)
Requesting Physician: Dr. Dina Rich    Chief Complaint: Aphasia, difficulty naming   History obtained from: Patient and Chart  HPI:                                                                                                                                       [name] [name] is an 84 y.o. male past medical history of diabetes, hyperlipidemia, AA aneurysm, TIA presents with sudden onset difficulty remembering names noticed by his wife for on 6 PM last night. She called her daughter who brought the patient to [facility] ER. He waited through triage for 6 hours, initially thought to be TIA. MRI brain was performed which showed 2 punctate infarcts in the left ACA and MCA territory and MRA brain showing intracranial atherosclerotic disease. Neurology was consulted for further workup. Patient did have 30 day monitor placed after TIA 6 months ago that did not show atrial fibrillation.  Date last known well: 3.11/19 Time last known well: 6 pm tPA Given: no, mild symptoms NIHSS: 0 Baseline MRS 0     Past Medical History:  Diagnosis Date  . AAA (abdominal aortic aneurysm) ([place])   . Celiac artery aneurysm ([place])   . Diabetes mellitus without complication ([place])   . Hiatal hernia   . Hyperlipidemia     Past Surgical History:  Procedure Laterality Date  . ABDOMINAL AORTIC ANEURYSM REPAIR  10-2003   also had iliac aneurysm repair  . CHOLECYSTECTOMY     Laparoscopic cholecystectomy  . EYE SURGERY Bilateral    cataracts    Family History  Problem Relation Age of Onset  . Arthritis Mother        Rheumatoid arthritis   Social History:  reports that  has never smoked. he has never used smokeless tobacco. He reports that he does not drink alcohol or use drugs.  Allergies:  Allergies  Allergen Reactions  . Actos [Pioglitazone] Swelling  . Allegra [Fexofenadine] Other (See Comments)    stimulant  . Iodine Swelling and Other (See Comments)    DEADLY SICK  . Metformin And Related Diarrhea     Pt states that he has a reaction to the extended release metformin but takes the regular  . Onglyza [Saxagliptin] Other (See Comments)    Weight loss  . Shellfish Allergy     DEADLY SICK    Medications:  I reviewed home medications   ROS:                                                                                                                                     14 systems reviewed and negative except above    Examination:                                                                                                      General: Appears well-developed and well-nourished.  Psych: Affect appropriate to situation Eyes: No scleral injection HENT: No OP obstrucion Head: Normocephalic.  Cardiovascular: Normal rate and regular rhythm.  Respiratory: Effort normal and breath sounds normal to anterior ascultation GI: Soft.  No distension. There is no tenderness.  Skin: WDI   Neurological Examination Mental Status: Alert, oriented, thought content appropriate.  Speech fluent without evidence of aphasia. Able to follow 3 step commands without difficulty. Cranial Nerves: II: Visual fields grossly normal,  III,IV, VI: ptosis not present, extra-ocular motions intact bilaterally, pupils equal, round, reactive to light and accommodation V,VII: smile symmetric, facial light touch sensation normal bilaterally VIII: hearing normal bilaterally IX,X: uvula rises symmetrically XI: bilateral shoulder shrug XII: midline tongue extension Motor: Right : Upper extremity   5/5    Left:     Upper extremity   5/5  Lower extremity   5/5     Lower extremity   5/5 Tone and bulk:normal tone throughout; no atrophy noted Sensory: Pinprick and light touch intact throughout, bilaterally Deep Tendon Reflexes: 2+ and symmetric throughout Plantars: Right: downgoing   Left:  downgoing Cerebellar: normal finger-to-nose, normal rapid alternating movements and normal heel-to-shin test Gait: normal gait and station     Lab Results: Basic Metabolic Panel: Recent Labs  Lab 09/08/17 2109-09-29 09/08/17 September 29, 2128  NA 136 138  K 4.3 4.2  CL 102 100*  CO2 25  --   GLUCOSE 201* 197*  BUN 25* 30*  CREATININE 1.02 1.10  CALCIUM 9.5  --     CBC: Recent Labs  Lab 09/08/17 2109-09-29 09/08/17 2130  WBC 6.8  --   NEUTROABS 4.2  --   HGB 13.7 12.9*  HCT 39.5 38.0*  MCV 94.0  --   PLT 171  --     Coagulation Studies: Recent Labs    09/08/17 29-Sep-2109  LABPROT 13.6  INR 1.05    Imaging: Ct Head Wo Contrast  Result Date: 09/08/2017 CLINICAL DATA:  Memory loss EXAM: CT HEAD WITHOUT CONTRAST TECHNIQUE: Contiguous  axial images were obtained from the base of the skull through the vertex without intravenous contrast. COMPARISON:  01/16/2017 FINDINGS: Brain: Mild atrophic changes are noted. No findings to suggest acute hemorrhage, acute infarction or space-occupying mass lesion is seen. Vascular: No hyperdense vessel or unexpected calcification. Skull: Normal. Negative for fracture or focal lesion. Sinuses/Orbits: No acute finding. Other: None. IMPRESSION: Mild atrophic changes stable from the prior exam. No acute abnormality noted. Electronically Signed   By: Inez Catalina M.D.   On: 09/08/2017 21:21   Mr Brain Wo Contrast  Result Date: 09/09/2017 CLINICAL DATA:  84 y/o  M; memory problems at 18:30 on 09/08/2017. EXAM: MRI HEAD WITHOUT CONTRAST TECHNIQUE: Multiplanar, multiecho pulse sequences of the brain and surrounding structures were obtained without intravenous contrast. COMPARISON:  01/16/2017 MRI of the head.  09/08/2017 CT of the head. FINDINGS: Brain: Punctate foci of reduced diffusion are present in the left inferior medial frontal lobe (series 3, image 21) and left posterior temporal lobes (series 3, image 25) compatible with acute/early subacute infarctions. No hemorrhage  or mass effect. 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 Severalnonspecific foci of T2 FLAIR hyperintense signal abnormality in subcortical and periventricular white matter as well as pons are compatible withmoderatechronic microvascular ischemic changes for age. Moderatebrain parenchymal volume loss. Vascular: Normal flow voids. Skull and upper cervical spine: Normal marrow signal. Sinuses/Orbits: No acute findings. Bilateral intra-ocular lens replacement. Other: None. IMPRESSION: 1. Punctate foci of acute/early subacute infarction in left inferomedial frontal lobe and left posterior temporal lobe. No hemorrhage or mass effect. 2. Moderate chronic microvascular ischemic changes and parenchymal volume loss of the brain. These results were called by telephone at the time of interpretation on 09/09/2017 at 5:37 am to Dr. Thayer Jew , who verbally acknowledged these results. Electronically Signed   By: Kristine Garbe M.D.   On: 09/09/2017 05:42     ASSESSMENT AND PLAN  84 y.o. male past medical history of diabetes, hyperlipidemia, AA aneurysm, TIA presents with sudden onset difficulty remembering names noticed by his wife for on 6 PM last night.    Acute Ischemic Stroke   Risk factors: DM, HLD Etiology:  ESUS  Recommend # MRI of the brain without contrast #MRA Head and neck  #Transthoracic Echo  # Start patient on ASA 325mg  daily and Plavix 75 mg daily x 3 months for ICAD,  #Start or continue Atorvastatin 80 mg/other high intensity statin # BP goal: permissive HTN upto 010 systolic, PRNs above 21 # HBAIC and Lipid profile # Telemetry monitoring # Frequent neuro checks # NPO until passes stroke swallow screen  Please page stroke NP  Or  PA  Or MD from 8am -4 pm  as this patient from this time will be  followed by the stroke.   You can look them up on [email or web address]  Password [facility]  Sushanth Aroor [facility] Pager Number  [phone]

## 2017-09-09 NOTE — Progress Notes (Signed)
  Echocardiogram 2D Echocardiogram has been performed.  [name] [name] 09/09/2017, 3:56 PM

## 2017-09-09 NOTE — H&P (Signed)
History and Physical    [name] [name] NKN:[id number] DOB: [birth year] DOA: 09/09/2017  PCP: [name], MD   Patient coming from: Home  Chief Complaint: Word-finding difficulty   HPI: [name] [name] is a 84 y.o. male with medical history significant for peripheral arterial disease, type 2 diabetes mellitus, hypertension, and history of CVA in July 2018, now presenting to the emergency department for evaluation of word finding difficulty.  Patient had been in his usual state of health until approximately 6:30 PM yesterday evening when he was noted by family to have difficulty with word finding.  Patient reports a mild headache, but denies any chest pain or palpitations.  He denies any recent fall or trauma.  No recent fevers, chills, cough, or dyspnea.  He wore a 30-day ambulatory heart monitor following the stroke last summer, and was then recommended for a loop recorder, but he declined this.  ED Course: Upon arrival to the ED, patient is found to be afebrile, saturating well on room air, hypertensive to 190/80, and vitals otherwise normal.  EKG features a sinus rhythm with nonspecific T wave abnormality.  Head CT is notable for mild atrophy, but no acute finding.  MRI brain reveals punctate foci of acute infarction in the left inferomedial frontal lobe and left posterior temporal lobe without hemorrhage or mass-effect.  Chemistry panel is notable for a glucose of 201 and CBC is unremarkable.  INR is normal and troponin is undetectable.  Neurology was consulted by the ED physician and recommended medical admission for further evaluation of acute CVA.  Patient remains mildly hypertensive, in no apparent distress, and will be observed on the telemetry unit for ongoing evaluation and management of word finding difficulty secondary to acute CVA.  Review of Systems:  All other systems reviewed and apart from HPI, are negative.  Past Medical History:  Diagnosis Date  . AAA (abdominal  aortic aneurysm) ([place])   . 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 Diabetes mellitus without complication ([place])   . Hiatal hernia   . Hyperlipidemia     Past Surgical History:  Procedure Laterality Date  . ABDOMINAL AORTIC ANEURYSM REPAIR  10-2003   also had iliac aneurysm repair  . CHOLECYSTECTOMY     Laparoscopic cholecystectomy  . EYE SURGERY Bilateral    cataracts     reports that  has never smoked. he has never used smokeless tobacco. He reports that he does not drink alcohol or use drugs.  Allergies  Allergen Reactions  . Actos [Pioglitazone] Swelling  . Allegra [Fexofenadine] Other (See Comments)    stimulant  . Iodine Swelling and Other (See Comments)    DEADLY SICK  . Metformin And Related Diarrhea    Pt states that he has a reaction to the extended release metformin but takes the regular  . Onglyza [Saxagliptin] Other (See Comments)    Weight loss  . Shellfish Allergy     DEADLY SICK    Family History  Problem Relation Age of Onset  . Arthritis Mother        Rheumatoid arthritis     Prior to Admission medications   Medication Sig Start Date End Date Taking? Authorizing Provider  aspirin 325 MG tablet Take 1 tablet (325 mg total) by mouth daily. 01/18/17  Yes Verner Mould, MD  cholecalciferol (VITAMIN D) 1000 units tablet Take 1,000 Units by mouth daily with supper.   Yes [provider]  Coenzyme Q10 (COQ10) 100 MG CAPS Take 100 mg by  mouth daily.   Yes [provider]  diazepam (VALIUM) 5 MG tablet Take 2.5-5 mg by mouth at bedtime as needed for anxiety (restless legs).   Yes [provider]  levothyroxine (SYNTHROID, LEVOTHROID) 75 MCG tablet Take 75 mcg by mouth daily before breakfast.  09/15/13  Yes [provider]  loratadine (CLARITIN) 10 MG tablet Take 10 mg by mouth daily as needed for allergies.    Yes [provider]  metFORMIN (GLUCOPHAGE) 1000 MG tablet Take 1,000 mg by mouth 2 (two) times daily.  12/08/13  Yes [provider]  Multiple Vitamin (MULTIVITAMIN WITH MINERALS) TABS tablet Take 1 tablet by mouth daily.   Yes [provider]  Omega-3 1000 MG CAPS Take 1,000 mg by mouth daily.   Yes [provider]  omeprazole (PRILOSEC) 20 MG capsule Take 20 mg by mouth at bedtime.    Yes [provider]  rosuvastatin (CRESTOR) 10 MG tablet Take 10 mg by mouth at bedtime.    Yes [provider]  sildenafil (REVATIO) 20 MG tablet Take 40-100 mg by mouth as needed. For sexual activity 10/31/16  Yes [provider]  vitamin B-12 (CYANOCOBALAMIN) 1000 MCG tablet Take 1,000 mcg by mouth daily.   Yes [provider]    Physical Exam: Vitals:   09/09/17 0249 09/09/17 0300 09/09/17 0400 09/09/17 0545  BP: (!) 171/83 134/72 (!) 154/77 (!) 159/77  Pulse: 73 74 76 72  Resp: 14 17 17    Temp:      TempSrc:      SpO2: 100% 98% 94% 94%  Weight:      Height:          Constitutional: NAD, calm  Eyes: PERTLA, lids and conjunctivae normal ENMT: Mucous membranes are moist. Posterior pharynx clear of any exudate or lesions.   Neck: normal, supple, no masses, no thyromegaly Respiratory: clear to auscultation bilaterally, no wheezing, no crackles. Normal respiratory effort.   Cardiovascular: S1 & S2 heard, regular rate and rhythm. No extremity edema. No significant JVD. Abdomen: No distension, no tenderness, no masses palpated. Bowel sounds normal.  Musculoskeletal: no clubbing / cyanosis. No joint deformity upper and lower extremities.   Skin: no significant rashes, lesions, ulcers. Warm, dry, well-perfused. Neurologic: CN 2-12 grossly intact. Sensation to light touch intact, DTR normal. Strength 5/5 in all 4 limbs. Gross hearing deficit.  Psychiatric:Alert and oriented x 3. Calm, cooperative.     Labs on Admission: I have personally reviewed following labs and imaging studies  CBC: Recent Labs  Lab 09/08/17 2111 09/08/17 2130  WBC  6.8  --   NEUTROABS 4.2  --   HGB 13.7 12.9*  HCT 39.5 38.0*  MCV 94.0  --   PLT 171  --    Basic Metabolic Panel: Recent Labs  Lab 09/08/17 2111 09/08/17 2130  NA 136 138  K 4.3 4.2  CL 102 100*  CO2 25  --   GLUCOSE 201* 197*  BUN 25* 30*  CREATININE 1.02 1.10  CALCIUM 9.5  --    GFR: Estimated Creatinine Clearance: 41.5 mL/min (by C-G formula based on SCr of 1.1 mg/dL). Liver Function Tests: Recent Labs  Lab 09/08/17 2111  AST 22  ALT 19  ALKPHOS 78  BILITOT 0.6  PROT 6.5  ALBUMIN 3.6   No results for input(s): LIPASE, AMYLASE in the last 168 hours. No results for input(s): AMMONIA in the last 168 hours. Coagulation Profile: Recent Labs  Lab 09/08/17 2111  INR  1.05   Cardiac Enzymes: No results for input(s): CKTOTAL, CKMB, CKMBINDEX, TROPONINI in the last 168 hours. BNP (last 3 results) No results for input(s): PROBNP in the last 8760 hours. HbA1C: No results for input(s): HGBA1C in the last 72 hours. CBG: Recent Labs  Lab 09/08/17 2100  GLUCAP 198*   Lipid Profile: No results for input(s): CHOL, HDL, LDLCALC, TRIG, CHOLHDL, LDLDIRECT in the last 72 hours. Thyroid Function Tests: No results for input(s): TSH, T4TOTAL, FREET4, T3FREE, THYROIDAB in the last 72 hours. Anemia Panel: No results for input(s): VITAMINB12, FOLATE, FERRITIN, TIBC, IRON, RETICCTPCT in the last 72 hours. Urine analysis:    Component Value Date/Time   COLORURINE STRAW (A) 01/16/2017 1618   APPEARANCEUR CLEAR 01/16/2017 1618   LABSPEC 1.012 01/16/2017 1618   PHURINE 7.0 01/16/2017 1618   GLUCOSEU NEGATIVE 01/16/2017 1618   HGBUR NEGATIVE 01/16/2017 1618   BILIRUBINUR NEGATIVE 01/16/2017 1618   KETONESUR NEGATIVE 01/16/2017 1618   PROTEINUR NEGATIVE 01/16/2017 1618   NITRITE NEGATIVE 01/16/2017 1618   LEUKOCYTESUR NEGATIVE 01/16/2017 1618   Sepsis Labs: @LABRCNTIP (procalcitonin:4,lacticidven:4) )No results found for this or any previous visit (from the past 240  hour(s)).   Radiological Exams on Admission: Ct Head Wo Contrast  Result Date: 09/08/2017 CLINICAL DATA:  Memory loss EXAM: CT HEAD WITHOUT CONTRAST TECHNIQUE: Contiguous axial images were obtained from the base of the skull through the vertex without intravenous contrast. COMPARISON:  01/16/2017 FINDINGS: Brain: Mild atrophic changes are noted. No findings to suggest acute hemorrhage, acute infarction or space-occupying mass lesion is seen. Vascular: No hyperdense vessel or unexpected calcification. Skull: Normal. Negative for fracture or focal lesion. Sinuses/Orbits: No acute finding. Other: None. IMPRESSION: Mild atrophic changes stable from the prior exam. No acute abnormality noted. Electronically Signed   By: Inez Catalina M.D.   On: 09/08/2017 21:21   Mr Brain Wo Contrast  Result Date: 09/09/2017 CLINICAL DATA:  84 y/o  M; memory problems at 18:30 on 09/08/2017. EXAM: MRI HEAD WITHOUT CONTRAST TECHNIQUE: Multiplanar, multiecho pulse sequences of the brain and surrounding structures were obtained without intravenous contrast. COMPARISON:  01/16/2017 MRI of the head.  09/08/2017 CT of the head. FINDINGS: Brain: Punctate foci of reduced diffusion are present in the left inferior medial frontal lobe (series 3, image 21) and left posterior temporal lobes (series 3, image 25) compatible with acute/early subacute infarctions. No hemorrhage or mass effect. 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 Severalnonspecific foci of T2 FLAIR hyperintense signal abnormality in subcortical and periventricular white matter as well as pons are compatible withmoderatechronic microvascular ischemic changes for age. Moderatebrain parenchymal volume loss. Vascular: Normal flow voids. Skull and upper cervical spine: Normal marrow signal. Sinuses/Orbits: No acute findings. Bilateral intra-ocular lens replacement. Other: None. IMPRESSION: 1. Punctate foci of acute/early subacute infarction  in left inferomedial frontal lobe and left posterior temporal lobe. No hemorrhage or mass effect. 2. Moderate chronic microvascular ischemic changes and parenchymal volume loss of the brain. These results were called by telephone at the time of interpretation on 09/09/2017 at 5:37 am to Dr. Thayer Jew , who verbally acknowledged these results. Electronically Signed   By: Kristine Garbe M.D.   On: 09/09/2017 05:42    EKG: Independently reviewed. Sinus rhythm, non-specific T-wave abnormality.   Assessment/Plan  1. Acute ischemic CVA  - Presents with word-finding difficulty, MRI reveals punctate foci of acute ischemia in left inferomedial frontal lobe and left posterior temporal lobe  - Neurology is consulting and much appreciated  -  Patient wore ambulatory monitor for 30 days following prior CVA without a fib, was recommended for loop-recorder but wife was worried about this and they declined  - Continue cardiac monitoring, frequent neuro checks, PT/OT/SLP evals   - Check MRA head, carotid dopplers, echocardiogram, fasting lipids, and A1c  - Continue ASA and statin    2. Type II DM  - A1c was 7.4% in July '18  - Managed at home with metformin only, held on admission  - Follow CBG's and use Novolog SSI   3. Hypertension  - BP at goal  - Permit HTN in acute-phase ischemic CVA    4. PAD - No acute ischemia, feet warm and pink - Continue statin and ASA     DVT prophylaxis: Lovenox Code Status: Full  Family Communication: Wife updated at bedside Consults called: Neurology Admission status: Observation    Vianne Bulls, MD [facility] Pager [phone]  If 7PM-7AM, please contact night-coverage [email or web address] Password [facility]  09/09/2017, 6:05 AM

## 2017-09-09 NOTE — ED Notes (Signed)
Meal tray orered

## 2017-09-09 NOTE — Progress Notes (Signed)
Progress Note    DREXLER MALAND  UEK:[id number] DOB: 24-Nov-1932  DOA: 09/09/2017 PCP: [name], MD    Brief Narrative:   Chief complaint: Follow-up word finding difficulty  Medical records reviewed and are as summarized below:  [name] [name] is an 84 y.o. male with a PMH of PAD, type 2 diabetes, hypertension, prior stroke in 2018 who was admitted 09/09/17 for evaluation of the acute onset of word finding difficulty associated with mild headache.  Assessment/Plan:   Principal Problem:   Acute ischemic stroke ([place]) Acute CVA confirmed by MRI, which I have personally reviewed, which showed punctate foci of acute infarction in the left inferomedial frontal lobe and left posterior temporal lobe. MRA consistent with diffuse atherosclerotic disease, but no occlusions. Had a stroke in 2018 which was worked up with a 30 day ambulatory heart monitor. Loop recorder ultimately recommended but the patient declined. Continue to monitor on telemetry. PT/OT/ST ordered. Follow-up carotid Dopplers, 2-D echo, FLP and hemoglobin A1c. Continue aspirin/Plavix/statin. Neurology consultation pending.   Active Problems:   PAD (peripheral artery disease) (HCC) Continue aspirin/Plavix and statin.    Diabetes mellitus (HCC) Repeat hemoglobin A1c pending. Currently being managed with insulin sensitive SSI 3 times a day/at bedtime. CBGs 183-198. We'll add 10 units of Lantus.    Essential hypertension Allow permissive hypertension.    Hypothyroidism Continue Synthroid.   Body mass index is 21.46 kg/m.   Family Communication/Anticipated D/C date and plan/Code Status   DVT prophylaxis: Lovenox ordered. Code Status: Full Code.  Family Communication: Wife updated at the bedside. Disposition Plan: Home in 24-48 hours depending on diagnostic test results.   Medical Consultants:    Neurology   Anti-Infectives:    None  Subjective:   The patient feels well this morning.   He does not notice any trouble finding his words and his speech is clear.  No complaints of headache.  Objective:    Vitals:   09/09/17 0700 09/09/17 0800 09/09/17 0821 09/09/17 0900  BP: (!) 158/77 (!) 150/70 (!) 150/70 (!) 144/81  Pulse: 72 70 70 65  Resp: 15 15 14 15   Temp:      TempSrc:      SpO2: 95% 95% 94% 94%  Weight:      Height:       No intake or output data in the 24 hours ending 09/09/17 0934 Filed Weights   09/08/17 2058  Weight: 56.7 kg (125 lb)    Exam: General: No acute distress. Cardiovascular: Heart sounds show a regular rate, and rhythm. No gallops or rubs. No murmurs. No JVD. Lungs: Clear to auscultation bilaterally with good air movement. No rales, rhonchi or wheezes. Abdomen: Soft, nontender, nondistended with normal active bowel sounds. No masses. No hepatosplenomegaly. Neurological: Alert and oriented 3. Moves all extremities 4 with equal strength. Cranial nerves II through XII grossly intact.  No word finding difficulty, aphasia, or dysarthria. Skin: Warm and dry. No rashes or lesions. Extremities: No clubbing or cyanosis. No edema. Pedal pulses 2+. Psychiatric: Mood and affect are normal. Insight and judgment are fair.   Data Reviewed:   I have personally reviewed following labs and imaging studies:  Labs: Labs show the following:   Basic Metabolic Panel: Recent Labs  Lab 09/08/17 2111 09/08/17 2130  NA 136 138  K 4.3 4.2  CL 102 100*  CO2 25  --   GLUCOSE 201* 197*  BUN 25* 30*  CREATININE 1.02 1.10  CALCIUM 9.5  --  GFR Estimated Creatinine Clearance: 41.5 mL/min (by C-G formula based on SCr of 1.1 mg/dL). Liver Function Tests: Recent Labs  Lab 09/08/17 2111  AST 22  ALT 19  ALKPHOS 78  BILITOT 0.6  PROT 6.5  ALBUMIN 3.6   Coagulation profile Recent Labs  Lab 09/08/17 2111  INR 1.05    CBC: Recent Labs  Lab 09/08/17 2111 09/08/17 2130  WBC 6.8  --   NEUTROABS 4.2  --   HGB 13.7 12.9*  HCT 39.5 38.0*    MCV 94.0  --   PLT 171  --    CBG: Recent Labs  Lab 09/08/17 2100 09/09/17 0829  GLUCAP 198* 183*   Hgb A1c: No results for input(s): HGBA1C in the last 72 hours. Lipid Profile: No results for input(s): CHOL, HDL, LDLCALC, TRIG, CHOLHDL, LDLDIRECT in the last 72 hours.  Microbiology No results found for this or any previous visit (from the past 240 hour(s)).  Procedures and diagnostic studies:  Ct Head Wo Contrast  Result Date: 09/08/2017 CLINICAL DATA:  Memory loss EXAM: CT HEAD WITHOUT CONTRAST TECHNIQUE: Contiguous axial images were obtained from the base of the skull through the vertex without intravenous contrast. COMPARISON:  01/16/2017 FINDINGS: Brain: Mild atrophic changes are noted. No findings to suggest acute hemorrhage, acute infarction or space-occupying mass lesion is seen. Vascular: No hyperdense vessel or unexpected calcification. Skull: Normal. Negative for fracture or focal lesion. Sinuses/Orbits: No acute finding. Other: None. IMPRESSION: Mild atrophic changes stable from the prior exam. No acute abnormality noted. Electronically Signed   By: Inez Catalina M.D.   On: 09/08/2017 21:21   Mr Brain Wo Contrast  Result Date: 09/09/2017 CLINICAL DATA:  84 y/o  M; memory problems at 18:30 on 09/08/2017. EXAM: MRI HEAD WITHOUT CONTRAST TECHNIQUE: Multiplanar, multiecho pulse sequences of the brain and surrounding structures were obtained without intravenous contrast. COMPARISON:  01/16/2017 MRI of the head.  09/08/2017 CT of the head. FINDINGS: Brain: Punctate foci of reduced diffusion are present in the left inferior medial frontal lobe (series 3, image 21) and left posterior temporal lobes (series 3, image 25) compatible with acute/early subacute infarctions. No hemorrhage or mass effect. 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 Severalnonspecific foci of T2 FLAIR hyperintense signal abnormality in subcortical and periventricular white  matter as well as pons are compatible withmoderatechronic microvascular ischemic changes for age. Moderatebrain parenchymal volume loss. Vascular: Normal flow voids. Skull and upper cervical spine: Normal marrow signal. Sinuses/Orbits: No acute findings. Bilateral intra-ocular lens replacement. Other: None. IMPRESSION: 1. Punctate foci of acute/early subacute infarction in left inferomedial frontal lobe and left posterior temporal lobe. No hemorrhage or mass effect. 2. Moderate chronic microvascular ischemic changes and parenchymal volume loss of the brain. These results were called by telephone at the time of interpretation on 09/09/2017 at 5:37 am to Dr. Thayer Jew , who verbally acknowledged these results. Electronically Signed   By: Kristine Garbe M.D.   On: 09/09/2017 05:42   [name] Head Wo Contrast  Result Date: 09/09/2017 CLINICAL DATA:  Speech disturbance. Punctate acute infarctions in the left brain as shown on previous parenchymal imaging. EXAM: MRA HEAD WITHOUT CONTRAST TECHNIQUE: Angiographic images of the Circle of Willis were obtained using MRA technique without intravenous contrast. COMPARISON:  MRI brain earlier same day.  CT 09/08/2017. FINDINGS: Internal carotid arteries are somewhat ectatic but widely patent through the brain and siphon regions. The anterior and middle cerebral vessels are patent without proximal stenosis, aneurysm or  vascular malformation. More distal branch vessels show mild atherosclerotic irregularity. Right PCA takes a fetal origin from the anterior circulation in that vessel also shows some distal vessel atherosclerotic irregularity. Both vertebral arteries are widely patent to the basilar. No basilar stenosis. Posterior circulation branch vessels are patent. As noted above, right PCA originates from the anterior circulation and left PCA originates from the basilar tip. Both PCAs show some distal vessel atherosclerotic irregularity. IMPRESSION: No large or  medium vessel occlusion or correctable proximal stenosis. Widespread distal vessel narrowing and irregularity consistent with intracranial atherosclerotic disease. Electronically Signed   By: Nelson Chimes M.D.   On: 09/09/2017 07:53    Medications:   .  stroke: mapping our early stages of recovery book   Does not apply Once  . aspirin  325 mg Oral Daily  . cholecalciferol  1,000 Units Oral Q supper  . clopidogrel  75 mg Oral Daily  . enoxaparin (LOVENOX) injection  40 mg Subcutaneous Q24H  . insulin aspart  0-5 Units Subcutaneous QHS  . insulin aspart  0-9 Units Subcutaneous TID WC  . levothyroxine  75 mcg Oral QAC breakfast  . multivitamin with minerals  1 tablet Oral Daily  . omega-3 acid ethyl esters  1 g Oral Daily  . pantoprazole  40 mg Oral Daily  . rosuvastatin  10 mg Oral q1800  . vitamin B-12  1,000 mcg Oral Daily   Continuous Infusions: . sodium chloride 100 mL/hr at 09/09/17 0828     LOS: 0 days    I spent an additional 30 minutes with the patient and his wife reviewing his test results, discussing treatment options and the plan of care.   Time in: 10:00 am; Time out: 10:30 am.  Jacquelynn Cree  [facility] Pager [phone]. If unable to reach me by pager, please call my cell phone at [phone].  *Please refer to [email or web address], password [email or web address] to get updated schedule on who will round on this patient, as hospitalists switch teams weekly. If 7PM-7AM, please contact night-coverage at [email or web address], password [email or web address] for any overnight needs.  09/09/2017, 9:34 AM

## 2017-09-09 NOTE — Progress Notes (Signed)
Preliminary results by tech - Carotid Duplex Completed. No evidence of a significant stenosis, 1-39%. Oda Cogan, BS, RDMS, RVT

## 2017-09-09 NOTE — ED Notes (Signed)
Heart healthy/ carb modified breakfast tray ordered  

## 2017-09-09 NOTE — Progress Notes (Signed)
    [facility] has been requested to perform a transesophageal echocardiogram on [name] [name] for stroke.  After careful review of history and examination, the risks and benefits of transesophageal echocardiogram have been explained including risks of esophageal damage, perforation (1:10,000 risk), bleeding, pharyngeal hematoma as well as other potential complications associated with conscious sedation including aspiration, arrhythmia, respiratory failure and death. Alternatives to treatment were discussed, questions were answered. Patient is willing to proceed.   Kerin Ransom, [place]  09/09/2017 5:18 PM

## 2017-09-09 NOTE — ED Notes (Signed)
Breakfast Tray Ordered @ 0836-per Jessica, RN-called by Levada Dy

## 2017-09-09 NOTE — H&P (View-Only) (Signed)
NEUROHOSPITALISTS STROKE TEAM - DAILY PROGRESS NOTE   ADMISSION HISTORY: [name] [name] is an 84 y.o. male past medical history of diabetes, hyperlipidemia, AA aneurysm, TIA presents with sudden onset difficulty remembering names noticed by his wife for on 6 PM last night. She called her daughter who brought the patient to [facility] ER. He waited through triage for 6 hours, initially thought to be TIA. MRI brain was performed which showed 2 punctate infarcts in the left ACA and MCA territory and MRA brain showing intracranial atherosclerotic disease. Neurology was consulted for further workup. Patient did have 30 day monitor placed after TIA 6 months ago that did not show atrial fibrillation.  Date last known well: 3.11/19 Time last known well: 6 pm tPA Given: no, mild symptoms NIHSS: 0 Baseline MRS 0  SUBJECTIVE (INTERVAL HISTORY) Wife and daughter are at the bedside. Patient is found laying in bed in NAD. Overall he feels his condition is completely resolved. Voices no new complaints. No new events reported overnight.   Laboratory Results  CBC:  Recent Labs  Lab 09/08/17 2111 09/08/17 2130  WBC 6.8  --   HGB 13.7 12.9*  HCT 39.5 38.0*  MCV 94.0  --   PLT 171  --    BMP: Recent Labs  Lab 09/08/17 2111 09/08/17 2130  NA 136 138  K 4.3 4.2  CL 102 100*  CO2 25  --   GLUCOSE 201* 197*  BUN 25* 30*  CREATININE 1.02 1.10  CALCIUM 9.5  --    Coagulation Studies:  Recent Labs    09/08/17 2111  APTT 31  INR 1.05   Urine Drug Screen:     Component Value Date/Time   LABOPIA NONE DETECTED 01/16/2017 1618   COCAINSCRNUR NONE DETECTED 01/16/2017 1618   LABBENZ NONE DETECTED 01/16/2017 1618   AMPHETMU NONE DETECTED 01/16/2017 1618   THCU NONE DETECTED 01/16/2017 1618   LABBARB NONE DETECTED 01/16/2017 1618    Alcohol Level: No results for input(s): ETH in the last 168 hours.  Physical Examination   Vitals:   09/09/17 0900 09/09/17 0930 09/09/17 1016 09/09/17 1107  BP: (!) 144/81 (!) 168/83 (!) 154/71 138/81  Pulse: 65 80 77 76  Resp: 15 19 16 16   Temp:    98 F (36.7 C)  TempSrc:    Oral  SpO2: 94% 96% 97% 97%  Weight:    57.2 kg (126 lb 1.7 oz)  Height:    5\' 3"  (1.6 m)   General - Well nourished, well developed, in no apparent distress HEENT-  Normocephalic,  Cardiovascular - Regular rate and rhythm  Respiratory - Lungs clear bilaterally. No wheezing. Abdomen - soft and non-tender, BS normal Extremities- no edema or cyanosis  Neurological Examination  Mental Status: Alert, oriented, thought content appropriate.  Speech fluent without evidence of aphasia. Able to follow 3 step commands without difficulty. Cranial Nerves: II: Visual fields grossly normal,  III,IV, VI: ptosis not present, extra-ocular motions intact bilaterally, pupils equal, round, reactive to light and accommodation V,VII: smile symmetric, facial light touch sensation normal bilaterally VIII: hearing normal bilaterally IX,X: uvula rises symmetrically XI: bilateral shoulder shrug XII: midline tongue extension Motor: Right : Upper extremity   5/5    Left:     Upper extremity   5/5  Lower extremity   5/5     Lower extremity   5/5 Tone and bulk:normal tone throughout; no atrophy noted Sensory: Pinprick and light touch intact throughout, bilaterally Deep Tendon Reflexes:  2+ and symmetric throughout Plantars: Right: downgoing   Left: downgoing Cerebellar: normal finger-to-nose, normal rapid alternating movements and normal heel-to-shin test Gait: normal gait and station  Imaging Results  Ct Head Wo Contrast  Result Date: 09/08/2017 CLINICAL DATA:  Memory loss EXAM: CT HEAD WITHOUT CONTRAST TECHNIQUE: Contiguous axial images were obtained from the base of the skull through the vertex without intravenous contrast. COMPARISON:  01/16/2017 FINDINGS: Brain: Mild atrophic changes are noted. No findings to suggest acute  hemorrhage, acute infarction or space-occupying mass lesion is seen. Vascular: No hyperdense vessel or unexpected calcification. Skull: Normal. Negative for fracture or focal lesion. Sinuses/Orbits: No acute finding. Other: None. IMPRESSION: Mild atrophic changes stable from the prior exam. No acute abnormality noted. Electronically Signed   By: Inez Catalina M.D.   On: 09/08/2017 21:21   Mr Brain Wo Contrast  Result Date: 09/09/2017 CLINICAL DATA:  84 y/o  M; memory problems at 18:30 on 09/08/2017. EXAM: MRI HEAD WITHOUT CONTRAST TECHNIQUE: Multiplanar, multiecho pulse sequences of the brain and surrounding structures were obtained without intravenous contrast. COMPARISON:  01/16/2017 MRI of the head.  09/08/2017 CT of the head. FINDINGS: Brain: Punctate foci of reduced diffusion are present in the left inferior medial frontal lobe (series 3, image 21) and left posterior temporal lobes (series 3, image 25) compatible with acute/early subacute infarctions. No hemorrhage or mass effect. 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 Severalnonspecific foci of T2 FLAIR hyperintense signal abnormality in subcortical and periventricular white matter as well as pons are compatible withmoderatechronic microvascular ischemic changes for age. Moderatebrain parenchymal volume loss. Vascular: Normal flow voids. Skull and upper cervical spine: Normal marrow signal. Sinuses/Orbits: No acute findings. Bilateral intra-ocular lens replacement. Other: None. IMPRESSION: 1. Punctate foci of acute/early subacute infarction in left inferomedial frontal lobe and left posterior temporal lobe. No hemorrhage or mass effect. 2. Moderate chronic microvascular ischemic changes and parenchymal volume loss of the brain. These results were called by telephone at the time of interpretation on 09/09/2017 at 5:37 am to Dr. Thayer Jew , who verbally acknowledged these results. Electronically Signed   By:  Kristine Garbe M.D.   On: 09/09/2017 05:42   [name] Head Wo Contrast  Result Date: 09/09/2017 CLINICAL DATA:  Speech disturbance. Punctate acute infarctions in the left brain as shown on previous parenchymal imaging. EXAM: MRA HEAD WITHOUT CONTRAST TECHNIQUE: Angiographic images of the Circle of Willis were obtained using MRA technique without intravenous contrast. COMPARISON:  MRI brain earlier same day.  CT 09/08/2017. FINDINGS: Internal carotid arteries are somewhat ectatic but widely patent through the brain and siphon regions. The anterior and middle cerebral vessels are patent without proximal stenosis, aneurysm or vascular malformation. More distal branch vessels show mild atherosclerotic irregularity. Right PCA takes a fetal origin from the anterior circulation in that vessel also shows some distal vessel atherosclerotic irregularity. Both vertebral arteries are widely patent to the basilar. No basilar stenosis. Posterior circulation branch vessels are patent. As noted above, right PCA originates from the anterior circulation and left PCA originates from the basilar tip. Both PCAs show some distal vessel atherosclerotic irregularity. IMPRESSION: No large or medium vessel occlusion or correctable proximal stenosis. Widespread distal vessel narrowing and irregularity consistent with intracranial atherosclerotic disease. Electronically Signed   By: Nelson Chimes M.D.   On: 09/09/2017 07:53   B/L Carotid U/S:  No evidence of a significant stenosis, 1-39%.          TEE /Loop Recorder:  PENDING  Echocardiogram:   PENDING  IMPRESSION AND PLAN  Mr. CYPRUS KUANG is a 84 y.o. male with PMH of diabetes, hyperlipidemia, AA aneurysm, TIA presents with sudden onset difficulty remembering names.   Stroke - left MCA two punctate infarcts - embolic pattern - etiology unclear   Resultant back to baseline  CT head - no acute abnormality  MRI left MCA territory 2 punctate cortical  infarcts  MRA head no LVO, diffuse intracranial atherosclerosis  Carotid Doppler unremarkable  TTE pending  Recommend TEE and loop recorder for further cardioembolic evaluation  LDL - pending  HgbA1c - pending  VTE prophylaxis - Lovenox  Fall precautions  DIET - heart healthy diet, thin liquid  Aspirin PTA, currently on aspirin and Plavix. Continue DAPT for 3 weeks and then plavix alone  Ongoing aggressive stroke risk factor management  Therapy recommendations:  pending  Disposition:  pending  History of stroke  12/2016 episodic aphasia, MRI showed left cerebellum punctate infarct EF 60-65%.  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  LDL 55, 30-day cardio event monitor no A. fib.  On aspirin Lipitor  Hypertension  BP stable  Long-term BP goal normotensive  Hyperlipidemia  Home meds: Crestor 10  LDL pending, goal < 70  Continue Crestor 10  Resume statin at discharge  DM  HbA1C pending, goal < 7.0  SSI  CBG monitoring  Other Stroke Risk Factors  Advanced age  PVD  OSA  Other active issue  AAA  Memory difficulty   Rosalin Hawking, MD PhD Stroke Neurology 09/09/2017 6:50 PM    To contact Stroke Provider, please refer to [email or web address] After hours, contact General Neurology

## 2017-09-10 ENCOUNTER — Inpatient Hospital Stay (HOSPITAL_COMMUNITY): Payer: PPO

## 2017-09-10 ENCOUNTER — Encounter (HOSPITAL_COMMUNITY)
Admission: EM | Disposition: A | Discharge status: Home / Self Care | Payer: Self-pay | Source: Home / Self Care | Attending: Internal Medicine

## 2017-09-10 ENCOUNTER — Encounter (HOSPITAL_COMMUNITY): Payer: Self-pay | Admitting: *Deleted

## 2017-09-10 DIAGNOSIS — I6389 Other cerebral infarction: Secondary | ICD-10-CM

## 2017-09-10 DIAGNOSIS — I639 Cerebral infarction, unspecified: Secondary | ICD-10-CM

## 2017-09-10 DIAGNOSIS — I351 Nonrheumatic aortic (valve) insufficiency: Secondary | ICD-10-CM

## 2017-09-10 DIAGNOSIS — I340 Nonrheumatic mitral (valve) insufficiency: Secondary | ICD-10-CM

## 2017-09-10 DIAGNOSIS — I63412 Cerebral infarction due to embolism of left middle cerebral artery: Secondary | ICD-10-CM

## 2017-09-10 HISTORY — PX: TEE WITHOUT CARDIOVERSION: SHX5443

## 2017-09-10 HISTORY — PX: LOOP RECORDER INSERTION: EP1214

## 2017-09-10 LAB — LIPID PANEL
Cholesterol: 127 mg/dL (ref 0–200)
HDL: 45 mg/dL (ref >40)
LDL Cholesterol: 74 mg/dL (ref 0–99)
Total CHOL/HDL Ratio: 2.8 RATIO
Triglycerides: 42 mg/dL (ref <150)
VLDL: 8 mg/dL (ref 0–40)

## 2017-09-10 LAB — ECHOCARDIOGRAM COMPLETE
Height: 63 in
Weight: 2017.65 oz

## 2017-09-10 LAB — GLUCOSE, CAPILLARY
Glucose-Capillary: 180 mg/dL — ABNORMAL HIGH (ref 65–99)
Glucose-Capillary: 174 mg/dL — ABNORMAL HIGH (ref 65–99)

## 2017-09-10 LAB — HEMOGLOBIN A1C
Hgb A1c MFr Bld: 7.8 % — ABNORMAL HIGH (ref 4.8–5.6)
Mean Plasma Glucose: 177.16 mg/dL

## 2017-09-10 SURGERY — LOOP RECORDER INSERTION

## 2017-09-10 SURGERY — ECHOCARDIOGRAM, TRANSESOPHAGEAL
Anesthesia: Moderate Sedation

## 2017-09-10 MED ORDER — CLOPIDOGREL BISULFATE 75 MG PO TABS: 75.0000 mg | ORAL_TABLET | Freq: Every day | ORAL | 2 refills | Status: AC

## 2017-09-10 MED ORDER — BUTAMBEN-TETRACAINE-BENZOCAINE 2-2-14 % EX AERO
INHALATION_SPRAY | CUTANEOUS | Status: DC | PRN
  Administered 2017-09-10: 2 via TOPICAL

## 2017-09-10 MED ORDER — MIDAZOLAM HCL 5 MG/5ML IJ SOLN
INTRAMUSCULAR | Status: DC | PRN
  Administered 2017-09-10: 1 mg via INTRAVENOUS
  Administered 2017-09-10: 2 mg via INTRAVENOUS

## 2017-09-10 MED ORDER — LIDOCAINE-EPINEPHRINE 1 %-1:100000 IJ SOLN
INTRAMUSCULAR | Status: AC
  Filled 2017-09-10: qty 1

## 2017-09-10 MED ORDER — FENTANYL CITRATE (PF) 100 MCG/2ML IJ SOLN
INTRAMUSCULAR | Status: DC | PRN
  Administered 2017-09-10: 25 ug via INTRAVENOUS

## 2017-09-10 MED ORDER — INSULIN ASPART 100 UNIT/ML ~~LOC~~ SOLN: 0.0000 [IU] | Freq: Every day | SUBCUTANEOUS | Status: DC

## 2017-09-10 MED ORDER — MIDAZOLAM HCL 5 MG/ML IJ SOLN
INTRAMUSCULAR | Status: AC
  Filled 2017-09-10: qty 2

## 2017-09-10 MED ORDER — FENTANYL CITRATE (PF) 100 MCG/2ML IJ SOLN
INTRAMUSCULAR | Status: AC
  Filled 2017-09-10: qty 2

## 2017-09-10 MED ORDER — INSULIN ASPART 100 UNIT/ML ~~LOC~~ SOLN
0.0000 [IU] | Freq: Three times a day (TID) | SUBCUTANEOUS | Status: DC
  Administered 2017-09-10: 3 [IU] via SUBCUTANEOUS

## 2017-09-10 MED ORDER — ASPIRIN 325 MG PO TABS: 325.0000 mg | ORAL_TABLET | Freq: Every day | ORAL | 0 refills | Status: DC

## 2017-09-10 MED ORDER — LIDOCAINE-EPINEPHRINE 1 %-1:100000 IJ SOLN
INTRAMUSCULAR | Status: DC | PRN
  Administered 2017-09-10: 10 mL

## 2017-09-10 MED ORDER — INSULIN GLARGINE 100 UNIT/ML ~~LOC~~ SOLN
15.0000 [IU] | SUBCUTANEOUS | Status: DC
  Filled 2017-09-10: qty 0.15

## 2017-09-10 SURGICAL SUPPLY — 2 items
LOOP REVEAL LINQSYS (Prosthesis & Implant Heart) ×1 IMPLANT
PACK LOOP INSERTION (CUSTOM PROCEDURE TRAY) ×2 IMPLANT

## 2017-09-10 NOTE — Evaluation (Signed)
Occupational Therapy Evaluation Patient Details Name: [name] [name] MRN: [id number] DOB: 10/12/1932 Today's Date: 09/10/2017    History of Present Illness 84 y.o. male past medical history of diabetes, hyperlipidemia, AA aneurysm, TIA presents with sudden onset difficulty remembering names noticed by his wife for on 6 PM last night. She called her daughter who brought the patient to [facility] ER. He waited through triage for 6 hours, initially thought to be TIA. MRI brain was performed which showed 2 punctate infarcts in the left ACA and MCA territory and MRA brain showing intracranial atherosclerotic disease   Clinical Impression   Pt at independent baseline level of function, no AD needed for ADLs and ADL mobility. All education completed and no further acute OT indicated at this time    Follow Up Recommendations  No OT follow up    Equipment Recommendations  None recommended by OT    Recommendations for Other Services       Precautions / Restrictions Precautions Precautions: None Restrictions Weight Bearing Restrictions: No      Mobility Bed Mobility Overal bed mobility: Independent                Transfers Overall transfer level: Independent Equipment used: None                  Balance Overall balance assessment: Independent;No apparent balance deficits (not formally assessed)                             High Level Balance Comments: All of the lower level BERG activities were managed well by pt, but the alternating step up onto the steps proved difficult initially as does narrow BOS tasks. Standardized Balance Assessment Standardized Balance Assessment : Dynamic Gait Index   Dynamic Gait Index Level Surface: Normal Change in Gait Speed: Normal Gait with Horizontal Head Turns: Normal Gait with Vertical Head Turns: Normal Gait and Pivot Turn: Normal Step Over Obstacle: Normal Step Around Obstacles: Mild Impairment Steps: Mild  Impairment Total Score: 22     ADL either performed or assessed with clinical judgement   ADL Overall ADL's : At baseline;Independent                                             Vision Baseline Vision/History: Wears glasses Wears Glasses: Reading only Patient Visual Report: No change from baseline       Perception     Praxis      Pertinent Vitals/Pain Pain Assessment: No/denies pain     Hand Dominance Right   Extremity/Trunk Assessment Upper Extremity Assessment Upper Extremity Assessment: Overall WFL for tasks assessed   Lower Extremity Assessment Lower Extremity Assessment: Defer to PT evaluation   Cervical / Trunk Assessment Cervical / Trunk Assessment: Normal   Communication Communication Communication: No difficulties   Cognition Arousal/Alertness: Awake/alert Behavior During Therapy: WFL for tasks assessed/performed Overall Cognitive Status: Within Functional Limits for tasks assessed                                     General Comments       Exercises     Shoulder Instructions      Home Living Family/patient expects to be discharged to:: Private residence Living Arrangements: Spouse/significant  other Available Help at Discharge: Family;Available 24 hours/day Type of Home: House Home Access: Level entry     Home Layout: One level     Bathroom Shower/Tub: Occupational psychologist: Standard     Home Equipment: Radio producer - single point      Lives With: Spouse    Prior Functioning/Environment Level of Independence: Independent        Comments: still drives        OT Problem List: Decreased activity tolerance      OT Treatment/Interventions:      OT Goals(Current goals can be found in the care plan section) Acute Rehab OT Goals Patient Stated Goal: go home OT Goal Formulation: With patient/family  OT Frequency:     Barriers to D/C:    no barriers       Co-evaluation               AM-PAC PT "6 Clicks" Daily Activity     Outcome Measure Help from another person eating meals?: None Help from another person taking care of personal grooming?: None Help from another person toileting, which includes using toliet, bedpan, or urinal?: None Help from another person bathing (including washing, rinsing, drying)?: None Help from another person to put on and taking off regular upper body clothing?: None Help from another person to put on and taking off regular lower body clothing?: None 6 Click Score: 24   End of Session    Activity Tolerance: Patient tolerated treatment well Patient left: in bed  OT Visit Diagnosis: Other symptoms and signs involving the nervous system (R29.898)                Time: 2263-3354 OT Time Calculation (min): 32 min Charges:  OT General Charges $OT Visit: 1 Visit OT Evaluation $OT Eval Low Complexity: 1 Low OT Treatments $Self Care/Home Management : 8-22 mins $Therapeutic Activity: 8-22 mins G-Codes: OT G-codes **NOT FOR INPATIENT CLASS** Functional Assessment Tool Used: AM-PAC 6 Clicks Daily Activity    [name] [name] 09/10/2017, 2:24 PM

## 2017-09-10 NOTE — Interval H&P Note (Signed)
History and Physical Interval Note:  09/10/2017 2:49 PM  [name] [name]  has presented today for surgery, with the diagnosis of STROKE  The various methods of treatment have been discussed with the patient and family. After consideration of risks, benefits and other options for treatment, the patient has consented to  Procedure(s): TRANSESOPHAGEAL ECHOCARDIOGRAM (TEE) (N/A) as a surgical intervention .  The patient's history has been reviewed, patient examined, no change in status, stable for surgery.  I have reviewed the patient's chart and labs.  Questions were answered to the patient's satisfaction.     [employer]

## 2017-09-10 NOTE — Progress Notes (Signed)
  Echocardiogram Echocardiogram Transesophageal has been performed.  [name] [name] 09/10/2017, 4:04 PM

## 2017-09-10 NOTE — CV Procedure (Signed)
   TEE  Indications: Stoke  Time out performed  During this procedure the patient is administered a total of Versed 4 mg and Fentanyl 25 mcg to achieve and maintain moderate conscious sedation.  The patient's heart rate, blood pressure, and oxygen saturation are monitored continuously during the procedure. The period of conscious sedation is 25 minutes, of which I was present face-to-face 100% of this time.  Findings:   - normal EF  - no masses  - no vegetations  - mild AI/MR  - negative bubble study, no PFO  Candee Furbish, MD

## 2017-09-10 NOTE — Progress Notes (Signed)
NEUROHOSPITALISTS STROKE TEAM - DAILY PROGRESS NOTE   SUBJECTIVE (INTERVAL HISTORY) Wife is at the bedside. TEE negative and loop recorder placed.   Laboratory Results  CBC:  Recent Labs  Lab 09/08/17 2111 09/08/17 2130  WBC 6.8  --   HGB 13.7 12.9*  HCT 39.5 38.0*  MCV 94.0  --   PLT 171  --    BMP: Recent Labs  Lab 09/08/17 2111 09/08/17 2130  NA 136 138  K 4.3 4.2  CL 102 100*  CO2 25  --   GLUCOSE 201* 197*  BUN 25* 30*  CREATININE 1.02 1.10  CALCIUM 9.5  --    Coagulation Studies:  Recent Labs    09/08/17 2111  APTT 31  INR 1.05   Urine Drug Screen:     Component Value Date/Time   LABOPIA NONE DETECTED 01/16/2017 1618   COCAINSCRNUR NONE DETECTED 01/16/2017 1618   LABBENZ NONE DETECTED 01/16/2017 1618   AMPHETMU NONE DETECTED 01/16/2017 1618   THCU NONE DETECTED 01/16/2017 1618   LABBARB NONE DETECTED 01/16/2017 1618    Alcohol Level: No results for input(s): ETH in the last 168 hours.  Physical Examination   Vitals:   09/10/17 1545 09/10/17 1550 09/10/17 1600 09/10/17 1610  BP:  (!) 179/60 (!) 151/60 (!) 161/52  Pulse:  73 72 71  Resp:  16 14 17   Temp:      TempSrc:      SpO2: 93% 95% 95% 97%  Weight:      Height:       General - Well nourished, well developed, in no apparent distress HEENT-  Normocephalic,  Cardiovascular - Regular rate and rhythm  Respiratory - Lungs clear bilaterally. No wheezing. Abdomen - soft and non-tender, BS normal Extremities- no edema or cyanosis  Neurological Examination  Mental Status: Alert, oriented, thought content appropriate.  Speech fluent without evidence of aphasia. Able to follow 3 step commands without difficulty. Cranial Nerves: II: Visual fields grossly normal,  III,IV, VI: ptosis not present, extra-ocular motions intact bilaterally, pupils equal, round, reactive to light and accommodation V,VII: smile symmetric, facial light touch  sensation normal bilaterally VIII: hearing normal bilaterally IX,X: uvula rises symmetrically XI: bilateral shoulder shrug XII: midline tongue extension Motor: Right : Upper extremity   5/5    Left:     Upper extremity   5/5  Lower extremity   5/5     Lower extremity   5/5 Tone and bulk:normal tone throughout; no atrophy noted Sensory: Pinprick and light touch intact throughout, bilaterally Deep Tendon Reflexes: 2+ and symmetric throughout Plantars: Right: downgoing   Left: downgoing Cerebellar: normal finger-to-nose, normal rapid alternating movements and normal heel-to-shin test Gait: normal gait and station  Imaging Results  Mr Brain Wo Contrast  Result Date: 09/09/2017 CLINICAL DATA:  84 y/o  M; memory problems at 18:30 on 09/08/2017. EXAM: MRI HEAD WITHOUT CONTRAST TECHNIQUE: Multiplanar, multiecho pulse sequences of the brain and surrounding structures were obtained without intravenous contrast. COMPARISON:  01/16/2017 MRI of the head.  09/08/2017 CT of the head. FINDINGS: Brain: Punctate foci of reduced diffusion are present in the left inferior medial frontal lobe (series 3, image 21) and left posterior temporal lobes (series 3, image 25) compatible with acute/early subacute infarctions. No hemorrhage or mass effect. 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 Severalnonspecific foci of T2 FLAIR hyperintense signal abnormality in subcortical and periventricular white matter as well as pons are compatible withmoderatechronic microvascular ischemic changes for age.  Moderatebrain parenchymal volume loss. Vascular: Normal flow voids. Skull and upper cervical spine: Normal marrow signal. Sinuses/Orbits: No acute findings. Bilateral intra-ocular lens replacement. Other: None. IMPRESSION: 1. Punctate foci of acute/early subacute infarction in left inferomedial frontal lobe and left posterior temporal lobe. No hemorrhage or mass effect. 2. Moderate chronic  microvascular ischemic changes and parenchymal volume loss of the brain. These results were called by telephone at the time of interpretation on 09/09/2017 at 5:37 am to Dr. Thayer Jew , who verbally acknowledged these results. Electronically Signed   By: Kristine Garbe M.D.   On: 09/09/2017 05:42   [name] Head Wo Contrast  Result Date: 09/09/2017 CLINICAL DATA:  Speech disturbance. Punctate acute infarctions in the left brain as shown on previous parenchymal imaging. EXAM: MRA HEAD WITHOUT CONTRAST TECHNIQUE: Angiographic images of the Circle of Willis were obtained using MRA technique without intravenous contrast. COMPARISON:  MRI brain earlier same day.  CT 09/08/2017. FINDINGS: Internal carotid arteries are somewhat ectatic but widely patent through the brain and siphon regions. The anterior and middle cerebral vessels are patent without proximal stenosis, aneurysm or vascular malformation. More distal branch vessels show mild atherosclerotic irregularity. Right PCA takes a fetal origin from the anterior circulation in that vessel also shows some distal vessel atherosclerotic irregularity. Both vertebral arteries are widely patent to the basilar. No basilar stenosis. Posterior circulation branch vessels are patent. As noted above, right PCA originates from the anterior circulation and left PCA originates from the basilar tip. Both PCAs show some distal vessel atherosclerotic irregularity. IMPRESSION: No large or medium vessel occlusion or correctable proximal stenosis. Widespread distal vessel narrowing and irregularity consistent with intracranial atherosclerotic disease. Electronically Signed   By: Nelson Chimes M.D.   On: 09/09/2017 07:53   B/L Carotid U/S:  No evidence of a significant stenosis, 1-39%.          TEE  - normal EF  - no masses  - no vegetations  - mild AI/MR  - negative bubble study, no PFO  Echocardiogram - Left ventricle: The cavity size was normal. Wall thickness  was   normal. Systolic function was vigorous. The estimated ejection   fraction was in the range of 65% to 70%. Wall motion was normal;   there were no regional wall motion abnormalities. Doppler   parameters are consistent with abnormal left ventricular   relaxation (grade 1 diastolic dysfunction). - Aortic valve: Trileaflet; mildly thickened, mildly calcified   leaflets. There was moderate regurgitation. - Tricuspid valve: There was mild regurgitation. Impressions: - No cardiac source of emboli was indentified.  IMPRESSION AND PLAN  [name] [name] is a 84 y.o. male with PMH of diabetes, hyperlipidemia, AA aneurysm, TIA presents with sudden onset difficulty remembering names.   Stroke - left MCA two punctate infarcts - embolic pattern - etiology unclear   Resultant back to baseline  CT head - no acute abnormality  MRI left MCA territory 2 punctate cortical infarcts  MRA head no LVO, diffuse intracranial atherosclerosis  Carotid Doppler unremarkable  TTE EF 65-70%  TEE unremarkable, no PFO  loop recorder placed  LDL - 74  HgbA1c - 7.8  VTE prophylaxis - Lovenox  Fall precautions  DIET - heart healthy diet, thin liquid  Aspirin PTA, currently on aspirin and Plavix. Continue DAPT for 3 weeks and then plavix alone  Ongoing aggressive stroke risk factor management  Therapy recommendations:  none  Disposition: home  History of stroke  12/2016 episodic aphasia, MRI  showed left cerebellum punctate infarct EF 60-65%.  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  LDL 55, 30-day cardio event monitor no A. fib.  On aspirin Lipitor  Hypertension  BP stable  Long-term BP goal normotensive  Hyperlipidemia  Home meds: Crestor 10  LDL 74, goal < 70  Continue Crestor 10  Resume statin at discharge  DM  HbA1C 7.8, goal < 7.0  uncontrolled  SSI  CBG monitoring  PCP close follow up  Other Stroke Risk Factors  Advanced age  PVD  OSA  Other  active issue  AAA  Memory difficulty  Neurology will sign off. Please call with questions. Pt will follow up with pt neurologist in [facility] in about 4 weeks. Thanks for the consult.   Rosalin Hawking, MD PhD Stroke Neurology 09/10/2017 11:31 PM    To contact Stroke Provider, please refer to [email or web address] After hours, contact General Neurology

## 2017-09-10 NOTE — Discharge Summary (Signed)
Physician Discharge Summary  [name] [name] DJS:[id number] DOB: 07-Jan-1933 DOA: 09/09/2017  PCP: [name] Jordan, MD  Admit date: 09/09/2017 Discharge date: 09/10/2017  Admitted From: Home Discharge disposition: Home   Recommendations for Outpatient Follow-Up:   1. Loop recorder placed. The patient has follow-up with his neurologist already scheduled.   Discharge Diagnosis:   Principal Problem:   Cerebrovascular accident (CVA) ([place]) Active Problems:   PAD (peripheral artery disease) ([place])   Diabetes mellitus ([place])   Essential hypertension   Discharge Condition: Improved.  Diet recommendation: Low sodium, heart healthy.  Carbohydrate-modified.    Code status: Full.   History of Present Illness:   [name] [name] is an 84 y.o. male with a PMH of PAD, type 2 diabetes, hypertension, prior stroke in 2018 who was admitted 09/09/17 for evaluation of the acute onset of word finding difficulty associated with mild headache.  Hospital Course by Problem:   Principal Problem:   Acute ischemic stroke ([place]) Acute CVA confirmed by MRI, which showed punctate foci of acute infarction in the left inferomedial frontal lobe and left posterior temporal lobe as shown below. MRA consistent with diffuse atherosclerotic disease, but no occlusions. Carotid Dopplers unremarkable and patient now scheduled for a TEE/loop recorder for further cardioembolic evaluation. Had a stroke in 2018 which was worked up with a 30 day ambulatory heart monitor. Loop recorder ultimately recommended at that time, but the patient declined. Continue to monitor on telemetry. PT/OT/ST evaluations performed and no follow-up needed . LDL is 74, hemoglobin A1c is 7.8%. Continue aspirin/Plavix 3 weeks, then Plavix alone/statin. Neurology following.    Active Problems:   PAD (peripheral artery disease) ([facility]) Continue antiplatelet therapy and statin.    Diabetes mellitus ([facility]) Hemoglobin A1c 7.8%. Currently  being managed with insulin sensitive SSI 3 times a day/at bedtime and 10 units of Lantus. CBGs 180-248. Increase Lantus to 15 units and change SSI to moderate scale.    Essential hypertension Allow permissive hypertension.    Hypothyroidism Continue Synthroid.   Medical Consultants:    Neurology  Cardiology.   Discharge Exam:   Vitals:   09/10/17 1600 09/10/17 1610  BP: (!) 151/60 (!) 161/52  Pulse: 72 71  Resp: 14 17  Temp:    SpO2: 95% 97%   Vitals:   09/10/17 1545 09/10/17 1550 09/10/17 1600 09/10/17 1610  BP:  (!) 179/60 (!) 151/60 (!) 161/52  Pulse:  73 72 71  Resp:  16 14 17   Temp:      TempSrc:      SpO2: 93% 95% 95% 97%  Weight:      Height:        General exam: Appears calm and comfortable.  Respiratory system: Clear to auscultation. Respiratory effort normal. Cardiovascular system: S1 & S2 heard, RRR. No JVD,  rubs, gallops or clicks. No murmurs. Gastrointestinal system: Abdomen is nondistended, soft and nontender. No organomegaly or masses felt. Normal bowel sounds heard. Central nervous system: Alert and oriented. No focal neurological deficits. No dysphasia or dysarthria. Extremities: No clubbing,  or cyanosis. No edema. Skin: No rashes, lesions or ulcers. Psychiatry: Judgement and insight appear normal. Mood & affect appropriate.    The results of significant diagnostics from this hospitalization (including imaging, microbiology, ancillary and laboratory) are listed below for reference.     Procedures and Diagnostic Studies:   Mr Brain Wo Contrast  Result Date: 09/09/2017 CLINICAL DATA:  84 y/o  M; memory problems at 18:30 on 09/08/2017. EXAM: MRI HEAD WITHOUT CONTRAST  TECHNIQUE: Multiplanar, multiecho pulse sequences of the brain and surrounding structures were obtained without intravenous contrast. COMPARISON:  01/16/2017 MRI of the head.  09/08/2017 CT of the head. FINDINGS: Brain: Punctate foci of reduced diffusion are present in the left  inferior medial frontal lobe (series 3, image 21) and left posterior temporal lobes (series 3, image 25) compatible with acute/early subacute infarctions. No hemorrhage or mass effect. 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 Severalnonspecific foci of T2 FLAIR hyperintense signal abnormality in subcortical and periventricular white matter as well as pons are compatible withmoderatechronic microvascular ischemic changes for age. Moderatebrain parenchymal volume loss. Vascular: Normal flow voids. Skull and upper cervical spine: Normal marrow signal. Sinuses/Orbits: No acute findings. Bilateral intra-ocular lens replacement. Other: None. IMPRESSION: 1. Punctate foci of acute/early subacute infarction in left inferomedial frontal lobe and left posterior temporal lobe. No hemorrhage or mass effect. 2. Moderate chronic microvascular ischemic changes and parenchymal volume loss of the brain. These results were called by telephone at the time of interpretation on 09/09/2017 at 5:37 am to Dr. Thayer [name] , who verbally acknowledged these results. Electronically Signed   By: [name] [name] M.D.   On: 09/09/2017 05:42   [name] Head Wo Contrast  Result Date: 09/09/2017 CLINICAL DATA:  Speech disturbance. Punctate acute infarctions in the left brain as shown on previous parenchymal imaging. EXAM: MRA HEAD WITHOUT CONTRAST TECHNIQUE: Angiographic images of the Circle of Willis were obtained using MRA technique without intravenous contrast. COMPARISON:  MRI brain earlier same day.  CT 09/08/2017. FINDINGS: Internal carotid arteries are somewhat ectatic but widely patent through the brain and siphon regions. The anterior and middle cerebral vessels are patent without proximal stenosis, aneurysm or vascular malformation. More distal branch vessels show mild atherosclerotic irregularity. Right PCA takes a fetal origin from the anterior circulation in that vessel also shows  some distal vessel atherosclerotic irregularity. Both vertebral arteries are widely patent to the basilar. No basilar stenosis. Posterior circulation branch vessels are patent. As noted above, right PCA originates from the anterior circulation and left PCA originates from the basilar tip. Both PCAs show some distal vessel atherosclerotic irregularity. IMPRESSION: No large or medium vessel occlusion or correctable proximal stenosis. Widespread distal vessel narrowing and irregularity consistent with intracranial atherosclerotic disease. Electronically Signed   By: Nelson Chimes M.D.   On: 09/09/2017 07:53     Labs:   Basic Metabolic Panel: Recent Labs  Lab 09/08/17 2111 09/08/17 2130  NA 136 138  K 4.3 4.2  CL 102 100*  CO2 25  --   GLUCOSE 201* 197*  BUN 25* 30*  CREATININE 1.02 1.10  CALCIUM 9.5  --    GFR Estimated Creatinine Clearance: 41.7 mL/min (by C-G formula based on SCr of 1.1 mg/dL). Liver Function Tests: Recent Labs  Lab 09/08/17 2111  AST 22  ALT 19  ALKPHOS 78  BILITOT 0.6  PROT 6.5  ALBUMIN 3.6   Coagulation profile Recent Labs  Lab 09/08/17 2111  INR 1.05    CBC: Recent Labs  Lab 09/08/17 2111 09/08/17 2130  WBC 6.8  --   NEUTROABS 4.2  --   HGB 13.7 12.9*  HCT 39.5 38.0*  MCV 94.0  --   PLT 171  --    CBG: Recent Labs  Lab 09/09/17 1122 09/09/17 1722 09/09/17 2124 09/10/17 0632 09/10/17 1225  GLUCAP 248* 193* 199* 180* 174*    Hgb A1c Recent Labs    09/10/17 0602  HGBA1C 7.8*  Lipid Profile Recent Labs    09/10/17 0602  CHOL 127  HDL 45  LDLCALC 74  TRIG 42  CHOLHDL 2.8    Discharge Instructions:   Discharge Instructions    Call MD for:   Complete by:  As directed    Difficulty speaking, swallowing.   Call MD for:  difficulty breathing, headache or visual disturbances   Complete by:  As directed    Call MD for:  persistant dizziness or light-headedness   Complete by:  As directed    Diet - low sodium heart  healthy   Complete by:  As directed    Increase activity slowly   Complete by:  As directed      Allergies as of 09/10/2017      Reactions   Actos [pioglitazone] Swelling   Allegra [fexofenadine] Other (See Comments)   stimulant   Iodine Swelling, Other (See Comments)   DEADLY SICK   Metformin And Related Diarrhea   Pt states that he has a reaction to the extended release metformin but takes the regular   Onglyza [saxagliptin] Other (See Comments)   Weight loss   Shellfish Allergy    DEADLY SICK      Medication List    TAKE these medications   aspirin 325 MG tablet Take 1 tablet (325 mg total) by mouth daily. Stop in 3 weeks and then just take Plavix alone. What changed:  additional instructions   cholecalciferol 1000 units tablet Commonly known as:  VITAMIN D Take 1,000 Units by mouth daily with supper.   clopidogrel 75 MG tablet Commonly known as:  PLAVIX Take 1 tablet (75 mg total) by mouth daily. Start taking on:  09/11/2017   CoQ10 100 MG Caps Take 100 mg by mouth daily.   diazepam 5 MG tablet Commonly known as:  VALIUM Take 2.5-5 mg by mouth at bedtime as needed for anxiety (restless legs).   levothyroxine 75 MCG tablet Commonly known as:  SYNTHROID, LEVOTHROID Take 75 mcg by mouth daily before breakfast.   loratadine 10 MG tablet Commonly known as:  CLARITIN Take 10 mg by mouth daily as needed for allergies.   metFORMIN 1000 MG tablet Commonly known as:  GLUCOPHAGE Take 1,000 mg by mouth 2 (two) times daily.   multivitamin with minerals Tabs tablet Take 1 tablet by mouth daily.   Omega-3 1000 MG Caps Take 1,000 mg by mouth daily.   omeprazole 20 MG capsule Commonly known as:  PRILOSEC Take 20 mg by mouth at bedtime.   rosuvastatin 10 MG tablet Commonly known as:  CRESTOR Take 10 mg by mouth at bedtime.   sildenafil 20 MG tablet Commonly known as:  REVATIO Take 40-100 mg by mouth as needed. For sexual activity   vitamin B-12 1000 MCG  tablet Commonly known as:  CYANOCOBALAMIN Take 1,000 mcg by mouth daily.      Follow-up Information    Karl Luke, MD. Go on 09/11/2017.   Specialty:  Neurology Contact information: [address] [place]  [id number] [phone]            Time coordinating discharge: 35 minutes.  SignedMargreta Journey Rama  Pager [phone] [facility] 09/10/2017, 4:58 PM

## 2017-09-10 NOTE — Consult Note (Addendum)
ELECTROPHYSIOLOGY CONSULT NOTE  Patient ID: [name] [name] MRN: [id number], DOB/AGE: Jan 11, 1933   Admit date: 09/09/2017 Date of Consult: 09/10/2017  Primary Physician: [name], MD Primary Cardiologist: Radford Pax Reason for Consultation: Cryptogenic stroke; recommendations regarding Implantable Loop Recorder  History of Present Illness EP has been asked to evaluate [name] [name] for placement of an implantable loop recorder to monitor for atrial fibrillation by Dr Erlinda Hong.  The patient was admitted on 09/09/2017 with sudden onset difficulty with remembering names.  They first developed symptoms while at home the day of admission.  Imaging demonstrated 2 punctate infarcts in the left MCA and ACA territory.  he has undergone workup for stroke including echocardiogram and carotid dopplers.  The patient has been monitored on telemetry which has demonstrated sinus rhythm with no arrhythmias.  Inpatient stroke work-up is to be completed with a TEE.   He has had prior stroke and was seen by EP in the outpatient setting 03/2017 after negative 30 day monitor with implantable loop recorder recommended at that time which he declined.   Echocardiogram this admission demonstrated EF 65-70%, no RWMA, grade 1 diastolic dysfunction, LA 29.  Lab work is reviewed.  Prior to admission, the patient denies chest pain, shortness of breath, dizziness, palpitations, or syncope.  They are recovering from their stroke with plans to return home at discharge.   Past Medical History:  Diagnosis Date  . AAA (abdominal aortic aneurysm) ([place])   . 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 Diabetes mellitus without complication ([place])   . Hiatal hernia   . HOH (hard of hearing)   . Hyperlipidemia   . Stroke [facility])      Surgical History:  Past Surgical History:  Procedure Laterality Date  . ABDOMINAL AORTIC ANEURYSM REPAIR  10-2003   also had iliac aneurysm repair  . CHOLECYSTECTOMY     Laparoscopic  cholecystectomy  . EYE SURGERY Bilateral    cataracts  . HERNIA REPAIR       Medications Prior to Admission  Medication Sig Dispense Refill Last Dose  . aspirin 325 MG tablet Take 1 tablet (325 mg total) by mouth daily. 30 tablet 0 09/08/2017 at Unknown time  . cholecalciferol (VITAMIN D) 1000 units tablet Take 1,000 Units by mouth daily with supper.   09/08/2017 at Unknown time  . Coenzyme Q10 (COQ10) 100 MG CAPS Take 100 mg by mouth daily.   09/08/2017 at Unknown time  . diazepam (VALIUM) 5 MG tablet Take 2.5-5 mg by mouth at bedtime as needed for anxiety (restless legs).   Past Week at Unknown time  . levothyroxine (SYNTHROID, LEVOTHROID) 75 MCG tablet Take 75 mcg by mouth daily before breakfast.    09/08/2017 at Unknown time  . loratadine (CLARITIN) 10 MG tablet Take 10 mg by mouth daily as needed for allergies.    Past Week at Unknown time  . metFORMIN (GLUCOPHAGE) 1000 MG tablet Take 1,000 mg by mouth 2 (two) times daily.   09/08/2017 at Unknown time  . Multiple Vitamin (MULTIVITAMIN WITH MINERALS) TABS tablet Take 1 tablet by mouth daily.   09/08/2017 at Unknown time  . Omega-3 1000 MG CAPS Take 1,000 mg by mouth daily.   09/08/2017 at Unknown time  . omeprazole (PRILOSEC) 20 MG capsule Take 20 mg by mouth at bedtime.    09/07/2017 at Unknown time  . rosuvastatin (CRESTOR) 10 MG tablet Take 10 mg by mouth at bedtime.    09/07/2017 at Unknown time  . sildenafil (  REVATIO) 20 MG tablet Take 40-100 mg by mouth as needed. For sexual activity  4 Past Month at Unknown time  . vitamin B-12 (CYANOCOBALAMIN) 1000 MCG tablet Take 1,000 mcg by mouth daily.   09/08/2017 at Unknown time    Inpatient Medications:  .  stroke: mapping our early stages of recovery book   Does not apply Once  . aspirin  325 mg Oral Daily  . cholecalciferol  1,000 Units Oral Q supper  . clopidogrel  75 mg Oral Daily  . enoxaparin (LOVENOX) injection  40 mg Subcutaneous Q24H  . insulin aspart  0-15 Units Subcutaneous TID WC  .  insulin aspart  0-5 Units Subcutaneous QHS  . [START ON 09/11/2017] insulin glargine  15 Units Subcutaneous BH-q7a  . levothyroxine  75 mcg Oral QAC breakfast  . multivitamin with minerals  1 tablet Oral Daily  . omega-3 acid ethyl esters  1 g Oral Daily  . pantoprazole  40 mg Oral Daily  . rosuvastatin  10 mg Oral q1800  . vitamin B-12  1,000 mcg Oral Daily    Allergies:  Allergies  Allergen Reactions  . Actos [Pioglitazone] Swelling  . Allegra [Fexofenadine] Other (See Comments)    stimulant  . Iodine Swelling and Other (See Comments)    DEADLY SICK  . Metformin And Related Diarrhea    Pt states that he has a reaction to the extended release metformin but takes the regular  . Onglyza [Saxagliptin] Other (See Comments)    Weight loss  . Shellfish Allergy     DEADLY SICK    Social History   Socioeconomic History  . Marital status: Married    Spouse name: Not on file  . Number of children: Not on file  . Years of education: Not on file  . Highest education level: Not on file  Social Needs  . Financial resource strain: Not on file  . Food insecurity - worry: Not on file  . Food insecurity - inability: Not on file  . Transportation needs - medical: Not on file  . Transportation needs - non-medical: Not on file  Occupational History  . Not on file  Tobacco Use  . Smoking status: Never Smoker  . Smokeless tobacco: Never Used  Substance and Sexual Activity  . Alcohol use: No  . Drug use: No  . Sexual activity: Not on file  Other Topics Concern  . Not on file  Social History Narrative  . Not on file     Family History  Problem Relation Age of Onset  . Arthritis Mother        Rheumatoid arthritis      Review of Systems: All other systems reviewed and are otherwise negative except as noted above.  Physical Exam: Vitals:   09/10/17 0208 09/10/17 0410 09/10/17 0751 09/10/17 1122  BP: (!) 149/63 (!) 162/62 (!) 165/71 (!) 167/75  Pulse: 70 68 68 70  Resp: 20 18  18 18   Temp: 98 F (36.7 C) 97.6 F (36.4 C) 97.6 F (36.4 C) 97.6 F (36.4 C)  TempSrc: Oral Oral Axillary Axillary  SpO2: 95% 96% 95% 98%  Weight:      Height:        GEN- The patient is elderly appearing, alert and oriented x 3 today.   Head- normocephalic, atraumatic Eyes-  Sclera clear, conjunctiva pink Ears- hearing intact Oropharynx- clear Neck- supple Lungs- Clear to ausculation bilaterally, normal work of breathing Heart- Regular rate and rhythm  GI- soft,  NT, ND, + BS Extremities- no clubbing, cyanosis, or edema MS- no significant deformity or atrophy Skin- no rash or lesion Psych- euthymic mood, full affect   Labs:   Lab Results  Component Value Date   WBC 6.8 09/08/2017   HGB 12.9 (L) 09/08/2017   HCT 38.0 (L) 09/08/2017   MCV 94.0 09/08/2017   PLT 171 09/08/2017    Recent Labs  Lab 09/08/17 2111 09/08/17 2130  NA 136 138  K 4.3 4.2  CL 102 100*  CO2 25  --   BUN 25* 30*  CREATININE 1.02 1.10  CALCIUM 9.5  --   PROT 6.5  --   BILITOT 0.6  --   ALKPHOS 78  --   ALT 19  --   AST 22  --   GLUCOSE 201* 197*     Radiology/Studies: Ct Head Wo Contrast  Result Date: 09/08/2017 CLINICAL DATA:  Memory loss EXAM: CT HEAD WITHOUT CONTRAST TECHNIQUE: Contiguous axial images were obtained from the base of the skull through the vertex without intravenous contrast. COMPARISON:  01/16/2017 FINDINGS: Brain: Mild atrophic changes are noted. No findings to suggest acute hemorrhage, acute infarction or space-occupying mass lesion is seen. Vascular: No hyperdense vessel or unexpected calcification. Skull: Normal. Negative for fracture or focal lesion. Sinuses/Orbits: No acute finding. Other: None. IMPRESSION: Mild atrophic changes stable from the prior exam. No acute abnormality noted. Electronically Signed   By: Inez Catalina M.D.   On: 09/08/2017 21:21   Mr Brain Wo Contrast  Result Date: 09/09/2017 CLINICAL DATA:  84 y/o  M; memory problems at 18:30 on  09/08/2017. EXAM: MRI HEAD WITHOUT CONTRAST TECHNIQUE: Multiplanar, multiecho pulse sequences of the brain and surrounding structures were obtained without intravenous contrast. COMPARISON:  01/16/2017 MRI of the head.  09/08/2017 CT of the head. FINDINGS: Brain: Punctate foci of reduced diffusion are present in the left inferior medial frontal lobe (series 3, image 21) and left posterior temporal lobes (series 3, image 25) compatible with acute/early subacute infarctions. No hemorrhage or mass effect. 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 Severalnonspecific foci of T2 FLAIR hyperintense signal abnormality in subcortical and periventricular white matter as well as pons are compatible withmoderatechronic microvascular ischemic changes for age. Moderatebrain parenchymal volume loss. Vascular: Normal flow voids. Skull and upper cervical spine: Normal marrow signal. Sinuses/Orbits: No acute findings. Bilateral intra-ocular lens replacement. Other: None. IMPRESSION: 1. Punctate foci of acute/early subacute infarction in left inferomedial frontal lobe and left posterior temporal lobe. No hemorrhage or mass effect. 2. Moderate chronic microvascular ischemic changes and parenchymal volume loss of the brain. These results were called by telephone at the time of interpretation on 09/09/2017 at 5:37 am to Dr. Thayer Jew , who verbally acknowledged these results. Electronically Signed   By: Kristine Garbe M.D.   On: 09/09/2017 05:42   [name] Head Wo Contrast  Result Date: 09/09/2017 CLINICAL DATA:  Speech disturbance. Punctate acute infarctions in the left brain as shown on previous parenchymal imaging. EXAM: MRA HEAD WITHOUT CONTRAST TECHNIQUE: Angiographic images of the Circle of Willis were obtained using MRA technique without intravenous contrast. COMPARISON:  MRI brain earlier same day.  CT 09/08/2017. FINDINGS: Internal carotid arteries are somewhat ectatic but  widely patent through the brain and siphon regions. The anterior and middle cerebral vessels are patent without proximal stenosis, aneurysm or vascular malformation. More distal branch vessels show mild atherosclerotic irregularity. Right PCA takes a fetal origin from the anterior circulation in that  vessel also shows some distal vessel atherosclerotic irregularity. Both vertebral arteries are widely patent to the basilar. No basilar stenosis. Posterior circulation branch vessels are patent. As noted above, right PCA originates from the anterior circulation and left PCA originates from the basilar tip. Both PCAs show some distal vessel atherosclerotic irregularity. IMPRESSION: No large or medium vessel occlusion or correctable proximal stenosis. Widespread distal vessel narrowing and irregularity consistent with intracranial atherosclerotic disease. Electronically Signed   By: Nelson Chimes M.D.   On: 09/09/2017 07:53    12-lead ECG sinus rhythm (personally reviewed) All prior EKG's in EPIC reviewed with no documented atrial fibrillation  Telemetry sinus rhythm (personally reviewed)  Assessment and Plan:  1. Cryptogenic stroke The patient presents with cryptogenic stroke.  The patient has a TEE planned for this AM.  I spoke at length with the patient about monitoring for afib with an implantable loop recorder.  Risks, benefits, and alteratives to implantable loop recorder were discussed with the patient today.   At this time, the patient is very clear in their decision to proceed with implantable loop recorder.   Wound care was reviewed with the patient (keep incision clean and dry for 3 days).  Wound check scheduled and entered in AVS.  Please call with questions.   Chanetta Marshall, NP 09/10/2017 12:01 PM   Pt w recurrent strokes and previous recommendation for Loop recorder,  Now with another event without clear cause  Will plan LINQ for Afib as possible cause of embolic event   Pt agreeable

## 2017-09-10 NOTE — Progress Notes (Signed)
Patient discharged home with family. Discharge information given. IV and telemetry discontinued. Patient and family questions asked and answered. Will transport from unit via wheelchair with staff. [name] [name]

## 2017-09-10 NOTE — Evaluation (Signed)
Physical Therapy Evaluation Patient Details Name: [name] [name] MRN: [id number] DOB: January 29, 1933 Today's Date: 09/10/2017   History of Present Illness  84 y.o. male past medical history of diabetes, hyperlipidemia, AA aneurysm, TIA presents with sudden onset difficulty remembering names noticed by his wife for on 6 PM last night. She called her daughter who brought the patient to [facility] ER. He waited through triage for 6 hours, initially thought to be TIA. MRI brain was performed which showed 2 punctate infarcts in the left ACA and MCA territory and MRA brain showing intracranial atherosclerotic disease  Clinical Impression  Pt is at or close to baseline functioning and should be safe at home. There are no further acute PT needs.  Will sign off at this time.     Follow Up Recommendations No PT follow up    Equipment Recommendations  None recommended by PT    Recommendations for Other Services       Precautions / Restrictions Precautions Precautions: None      Mobility  Bed Mobility Overal bed mobility: Independent                Transfers Overall transfer level: Independent Equipment used: None                Ambulation/Gait Ambulation/Gait assistance: Independent Ambulation Distance (Feet): 200 Feet Assistive device: None Gait Pattern/deviations: WFL(Within Functional Limits)   Gait velocity interpretation: at or above normal speed for age/gender General Gait Details: steady with age appropriate speed.  Pt able to scan, back up, change direction abruptly, step over objects, but trouble going at speed around objects.  Stairs Stairs: Yes Stairs assistance: Modified independent (Device/Increase time) Stair Management: One rail Right;Alternating pattern;Forwards Number of Stairs: 3 General stair comments: safe with the rail  Wheelchair Mobility    Modified Rankin (Stroke Patients Only) Modified Rankin (Stroke Patients Only) Pre-Morbid Rankin Score: No  significant disability Modified Rankin: No significant disability     Balance Overall balance assessment: Independent;No apparent balance deficits (not formally assessed)                             High Level Balance Comments: All of the lower level BERG activities were managed well by pt, but the alternating step up onto the steps proved difficult initially as does narrow BOS tasks. Standardized Balance Assessment Standardized Balance Assessment : Dynamic Gait Index   Dynamic Gait Index Level Surface: Normal Change in Gait Speed: Normal Gait with Horizontal Head Turns: Normal Gait with Vertical Head Turns: Normal Gait and Pivot Turn: Normal Step Over Obstacle: Normal Step Around Obstacles: Mild Impairment Steps: Mild Impairment Total Score: 22       Pertinent Vitals/Pain Pain Assessment: No/denies pain    Home Living Family/patient expects to be discharged to:: Private residence Living Arrangements: Spouse/significant other Available Help at Discharge: Family;Available 24 hours/day Type of Home: House Home Access: Level entry     Home Layout: One level Home Equipment: Cane - single point      Prior Function Level of Independence: Independent         Comments: still drives     Hand Dominance   Dominant Hand: Right    Extremity/Trunk Assessment   Upper Extremity Assessment Upper Extremity Assessment: Defer to OT evaluation    Lower Extremity Assessment Lower Extremity Assessment: Overall WFL for tasks assessed    Cervical / Trunk Assessment Cervical / Trunk Assessment: Normal  Communication  Communication: No difficulties  Cognition Arousal/Alertness: Awake/alert Behavior During Therapy: WFL for tasks assessed/performed Overall Cognitive Status: Within Functional Limits for tasks assessed                                        General Comments      Exercises     Assessment/Plan    PT Assessment Patent does  not need any further PT services  PT Problem List         PT Treatment Interventions      PT Goals (Current goals can be found in the Care Plan section)  Acute Rehab PT Goals PT Goal Formulation: All assessment and education complete, DC therapy    Frequency     Barriers to discharge        Co-evaluation               AM-PAC PT "6 Clicks" Daily Activity  Outcome Measure Difficulty turning over in bed (including adjusting bedclothes, sheets and blankets)?: None Difficulty moving from lying on back to sitting on the side of the bed? : None Difficulty sitting down on and standing up from a chair with arms (e.g., wheelchair, bedside commode, etc,.)?: None Help needed moving to and from a bed to chair (including a wheelchair)?: None Help needed walking in hospital room?: None Help needed climbing 3-5 steps with a railing? : None 6 Click Score: 24    End of Session   Activity Tolerance: Patient tolerated treatment well Patient left: in bed;with call bell/phone within reach;with family/visitor present Nurse Communication: Mobility status PT Visit Diagnosis: Unsteadiness on feet (R26.81)    Time: 6004-5997 PT Time Calculation (min) (ACUTE ONLY): 34 min   Charges:   PT Evaluation $PT Eval Low Complexity: 1 Low PT Treatments $Gait Training: 8-22 mins   PT G Codes:        Sep 23, 2017  Donnella Sham, PT [phone]  (pager)  Tessie Fass [place] 09-23-17, 11:47 AM

## 2017-09-10 NOTE — Evaluation (Signed)
Speech Language Pathology Evaluation Patient Details Name: [name] [name] MRN: [id number] DOB: 1932/11/08 Today's Date: 09/10/2017 Time: 0938-1829 SLP Time Calculation (min) (ACUTE ONLY): 28 min  Problem List:  Patient Active Problem List   Diagnosis Date Noted  . Cerebrovascular accident (CVA) ([place]) 09/09/2017  . Word finding difficulty   . Cerebral thrombosis with cerebral infarction 01/17/2017  . Aphasia   . Hyperlipidemia   . Essential hypertension   . Expressive aphasia 01/16/2017  . Diabetes mellitus ([place]) 01/16/2017  . 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 Aftercare following surgery of the circulatory system, [place] 12/09/2013  . Abdominal aneurysm without mention of rupture 12/09/2013  . PAD (peripheral artery disease) ([place]) 04/14/2013   Past Medical History:  Past Medical History:  Diagnosis Date  . AAA (abdominal aortic aneurysm) ([place])   . 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 Diabetes mellitus without complication ([place])   . Hiatal hernia   . HOH (hard of hearing)   . Hyperlipidemia   . Stroke [facility])    Past Surgical History:  Past Surgical History:  Procedure Laterality Date  . ABDOMINAL AORTIC ANEURYSM REPAIR  10-2003   also had iliac aneurysm repair  . CHOLECYSTECTOMY     Laparoscopic cholecystectomy  . EYE SURGERY Bilateral    cataracts  . HERNIA REPAIR     HPI:  84 y.o.malepast medical history of diabetes, hyperlipidemia, AA aneurysm, TIA presents with sudden onset difficulty remembering names noticed by his wife for on 6 PM last night. She called her daughter who brought the patient to [facility] ER. He waited through triage for 6 hours, initially thought to be TIA. MRI brain was performed which showed 2 punctate infarcts in the left ACA and MCA territory and MRA brain showing intracranial atherosclerotic disease.   Assessment / Plan / Recommendation Clinical Impression  Pt presents with fluent expression; no further dysnomia; receptive language WNL; normal  pragmatics; baseline cognition.  Good historian.  No SLP f/u needs - our services will sign off.     SLP Assessment  SLP Recommendation/Assessment: Patient does not need any further Speech Lanaguage Pathology Services    Follow Up Recommendations  None    Frequency and Duration           SLP Evaluation Cognition  Overall Cognitive Status: Within Functional Limits for tasks assessed Orientation Level: Oriented X4       Comprehension  Auditory Comprehension Overall Auditory Comprehension: Appears within functional limits for tasks assessed Visual Recognition/Discrimination Discrimination: Within Function Limits Reading Comprehension Reading Status: Within funtional limits    Expression Expression Primary Mode of Expression: Verbal Verbal Expression Overall Verbal Expression: Appears within functional limits for tasks assessed   Oral / Motor  Oral Motor/Sensory Function Overall Oral Motor/Sensory Function: Within functional limits Motor Speech Overall Motor Speech: Appears within functional limits for tasks assessed   GO                    [name] [name] 09/10/2017, 9:03 AM

## 2017-09-11 ENCOUNTER — Encounter (HOSPITAL_COMMUNITY): Payer: Self-pay | Admitting: Internal Medicine

## 2017-09-11 DIAGNOSIS — R259 Unspecified abnormal involuntary movements: Secondary | ICD-10-CM | POA: Diagnosis not present

## 2017-09-11 DIAGNOSIS — R413 Other amnesia: Secondary | ICD-10-CM | POA: Diagnosis not present

## 2017-09-11 DIAGNOSIS — I63412 Cerebral infarction due to embolism of left middle cerebral artery: Secondary | ICD-10-CM | POA: Diagnosis not present

## 2017-09-15 ENCOUNTER — Telehealth: Payer: Self-pay | Admitting: Internal Medicine

## 2017-09-15 NOTE — Telephone Encounter (Signed)
Spoke with patients wife and confirmed that his home monitor was working. Reviewed how the home monitor works and confirmed follow up appointment.

## 2017-09-15 NOTE — Telephone Encounter (Signed)
[name] [name] is calling to find out if Mr. Bledsoe loop recorder is working . Please call

## 2017-09-24 ENCOUNTER — Ambulatory Visit (INDEPENDENT_AMBULATORY_CARE_PROVIDER_SITE_OTHER): Payer: Self-pay | Admitting: *Deleted

## 2017-09-24 DIAGNOSIS — I639 Cerebral infarction, unspecified: Secondary | ICD-10-CM

## 2017-09-24 LAB — CUP PACEART INCLINIC DEVICE CHECK
Date Time Interrogation Session: 20190327133814
Implantable Pulse Generator Implant Date: 20190313

## 2017-09-24 NOTE — Progress Notes (Signed)
Wound check appointment. Steri-strips removed prior to appointment by patient. Wound without redness or edema. Incision edges approximated, wound well healed. Normal device function. Battery status: good. R-waves 1.32mV. No symptom, tachy, pause, brady, or AF episodes. Pause and brady detection reprogrammed off (no history of syncope). Patient educated about wound care and Carelink monitor. Monthly Summary Reports and ROV with SK PRN.

## 2017-10-10 ENCOUNTER — Ambulatory Visit (INDEPENDENT_AMBULATORY_CARE_PROVIDER_SITE_OTHER): Payer: PPO | Admitting: *Deleted

## 2017-10-10 DIAGNOSIS — I639 Cerebral infarction, unspecified: Secondary | ICD-10-CM

## 2017-10-13 NOTE — Progress Notes (Signed)
Carelink Summary Report / Loop Recorder 

## 2017-10-20 DIAGNOSIS — E119 Type 2 diabetes mellitus without complications: Secondary | ICD-10-CM | POA: Diagnosis not present

## 2017-10-23 DIAGNOSIS — I728 Aneurysm of other specified arteries: Secondary | ICD-10-CM | POA: Diagnosis not present

## 2017-10-23 DIAGNOSIS — E785 Hyperlipidemia, unspecified: Secondary | ICD-10-CM | POA: Diagnosis not present

## 2017-10-23 DIAGNOSIS — Z9889 Other specified postprocedural states: Secondary | ICD-10-CM | POA: Diagnosis not present

## 2017-10-23 DIAGNOSIS — E1169 Type 2 diabetes mellitus with other specified complication: Secondary | ICD-10-CM | POA: Diagnosis not present

## 2017-10-23 DIAGNOSIS — Z79899 Other long term (current) drug therapy: Secondary | ICD-10-CM | POA: Diagnosis not present

## 2017-10-23 DIAGNOSIS — E039 Hypothyroidism, unspecified: Secondary | ICD-10-CM | POA: Diagnosis not present

## 2017-10-23 DIAGNOSIS — K219 Gastro-esophageal reflux disease without esophagitis: Secondary | ICD-10-CM | POA: Diagnosis not present

## 2017-10-23 DIAGNOSIS — I639 Cerebral infarction, unspecified: Secondary | ICD-10-CM | POA: Diagnosis not present

## 2017-10-23 DIAGNOSIS — I739 Peripheral vascular disease, unspecified: Secondary | ICD-10-CM | POA: Diagnosis not present

## 2017-10-23 DIAGNOSIS — Z0000 Encounter for general adult medical examination without abnormal findings: Secondary | ICD-10-CM | POA: Diagnosis not present

## 2017-10-23 DIAGNOSIS — F324 Major depressive disorder, single episode, in partial remission: Secondary | ICD-10-CM | POA: Diagnosis not present

## 2017-10-23 DIAGNOSIS — I714 Abdominal aortic aneurysm, without rupture: Secondary | ICD-10-CM | POA: Diagnosis not present

## 2017-11-06 DIAGNOSIS — D1801 Hemangioma of skin and subcutaneous tissue: Secondary | ICD-10-CM | POA: Diagnosis not present

## 2017-11-06 DIAGNOSIS — L821 Other seborrheic keratosis: Secondary | ICD-10-CM | POA: Diagnosis not present

## 2017-11-06 DIAGNOSIS — L570 Actinic keratosis: Secondary | ICD-10-CM | POA: Diagnosis not present

## 2017-11-06 DIAGNOSIS — D692 Other nonthrombocytopenic purpura: Secondary | ICD-10-CM | POA: Diagnosis not present

## 2017-11-06 DIAGNOSIS — Z85828 Personal history of other malignant neoplasm of skin: Secondary | ICD-10-CM | POA: Diagnosis not present

## 2017-11-12 ENCOUNTER — Ambulatory Visit (INDEPENDENT_AMBULATORY_CARE_PROVIDER_SITE_OTHER): Payer: PPO | Admitting: *Deleted

## 2017-11-12 DIAGNOSIS — I639 Cerebral infarction, unspecified: Secondary | ICD-10-CM | POA: Diagnosis not present

## 2017-11-12 LAB — CUP PACEART REMOTE DEVICE CHECK
Date Time Interrogation Session: 20190412193658
Implantable Pulse Generator Implant Date: 20190313

## 2017-11-13 NOTE — Progress Notes (Signed)
Carelink Summary Report / Loop Recorder 

## 2017-11-19 ENCOUNTER — Other Ambulatory Visit: Payer: Self-pay

## 2017-11-19 DIAGNOSIS — R259 Unspecified abnormal involuntary movements: Secondary | ICD-10-CM | POA: Diagnosis not present

## 2017-11-19 DIAGNOSIS — I63412 Cerebral infarction due to embolism of left middle cerebral artery: Secondary | ICD-10-CM | POA: Diagnosis not present

## 2017-11-26 ENCOUNTER — Encounter (HOSPITAL_COMMUNITY): Payer: Self-pay | Admitting: *Deleted

## 2017-11-26 ENCOUNTER — Inpatient Hospital Stay (HOSPITAL_COMMUNITY)
Admission: EM | Admit: 2017-11-26 | Discharge: 2017-11-30 | DRG: 179 | Disposition: A | Discharge status: Home Health | Payer: PPO | Attending: Internal Medicine | Admitting: Internal Medicine

## 2017-11-26 ENCOUNTER — Encounter (HOSPITAL_COMMUNITY): Payer: Self-pay

## 2017-11-26 ENCOUNTER — Emergency Department (HOSPITAL_COMMUNITY): Payer: PPO

## 2017-11-26 ENCOUNTER — Emergency Department (HOSPITAL_COMMUNITY)
Admission: EM | Admit: 2017-11-26 | Discharge: 2017-11-26 | Disposition: A | Discharge status: Home / Self Care | Payer: PPO | Source: Home / Self Care | Attending: Emergency Medicine | Admitting: Emergency Medicine

## 2017-11-26 DIAGNOSIS — Z9841 Cataract extraction status, right eye: Secondary | ICD-10-CM | POA: Diagnosis not present

## 2017-11-26 DIAGNOSIS — Z79890 Hormone replacement therapy (postmenopausal): Secondary | ICD-10-CM

## 2017-11-26 DIAGNOSIS — Z9049 Acquired absence of other specified parts of digestive tract: Secondary | ICD-10-CM | POA: Diagnosis not present

## 2017-11-26 DIAGNOSIS — I739 Peripheral vascular disease, unspecified: Secondary | ICD-10-CM | POA: Diagnosis present

## 2017-11-26 DIAGNOSIS — Z9689 Presence of other specified functional implants: Secondary | ICD-10-CM | POA: Diagnosis present

## 2017-11-26 DIAGNOSIS — E860 Dehydration: Secondary | ICD-10-CM

## 2017-11-26 DIAGNOSIS — R1110 Vomiting, unspecified: Secondary | ICD-10-CM

## 2017-11-26 DIAGNOSIS — I4510 Unspecified right bundle-branch block: Secondary | ICD-10-CM | POA: Diagnosis not present

## 2017-11-26 DIAGNOSIS — Z7984 Long term (current) use of oral hypoglycemic drugs: Secondary | ICD-10-CM | POA: Diagnosis not present

## 2017-11-26 DIAGNOSIS — R531 Weakness: Secondary | ICD-10-CM | POA: Diagnosis not present

## 2017-11-26 DIAGNOSIS — Z7902 Long term (current) use of antithrombotics/antiplatelets: Secondary | ICD-10-CM | POA: Diagnosis not present

## 2017-11-26 DIAGNOSIS — E876 Hypokalemia: Secondary | ICD-10-CM | POA: Diagnosis not present

## 2017-11-26 DIAGNOSIS — Z7982 Long term (current) use of aspirin: Secondary | ICD-10-CM | POA: Diagnosis not present

## 2017-11-26 DIAGNOSIS — K222 Esophageal obstruction: Secondary | ICD-10-CM | POA: Diagnosis present

## 2017-11-26 DIAGNOSIS — Z9842 Cataract extraction status, left eye: Secondary | ICD-10-CM | POA: Diagnosis not present

## 2017-11-26 DIAGNOSIS — E1165 Type 2 diabetes mellitus with hyperglycemia: Secondary | ICD-10-CM | POA: Diagnosis not present

## 2017-11-26 DIAGNOSIS — W19XXXA Unspecified fall, initial encounter: Secondary | ICD-10-CM | POA: Diagnosis not present

## 2017-11-26 DIAGNOSIS — J189 Pneumonia, unspecified organism: Secondary | ICD-10-CM

## 2017-11-26 DIAGNOSIS — R509 Fever, unspecified: Secondary | ICD-10-CM | POA: Diagnosis not present

## 2017-11-26 DIAGNOSIS — E1159 Type 2 diabetes mellitus with other circulatory complications: Secondary | ICD-10-CM | POA: Diagnosis not present

## 2017-11-26 DIAGNOSIS — E785 Hyperlipidemia, unspecified: Secondary | ICD-10-CM | POA: Diagnosis not present

## 2017-11-26 DIAGNOSIS — J181 Lobar pneumonia, unspecified organism: Secondary | ICD-10-CM | POA: Insufficient documentation

## 2017-11-26 DIAGNOSIS — R633 Feeding difficulties: Secondary | ICD-10-CM | POA: Diagnosis not present

## 2017-11-26 DIAGNOSIS — I714 Abdominal aortic aneurysm, without rupture, unspecified: Secondary | ICD-10-CM | POA: Diagnosis present

## 2017-11-26 DIAGNOSIS — J690 Pneumonitis due to inhalation of food and vomit: Principal | ICD-10-CM | POA: Diagnosis present

## 2017-11-26 DIAGNOSIS — E039 Hypothyroidism, unspecified: Secondary | ICD-10-CM | POA: Diagnosis not present

## 2017-11-26 DIAGNOSIS — R338 Other retention of urine: Secondary | ICD-10-CM | POA: Diagnosis not present

## 2017-11-26 DIAGNOSIS — K224 Dyskinesia of esophagus: Secondary | ICD-10-CM | POA: Diagnosis not present

## 2017-11-26 DIAGNOSIS — R339 Retention of urine, unspecified: Secondary | ICD-10-CM | POA: Diagnosis not present

## 2017-11-26 DIAGNOSIS — Z888 Allergy status to other drugs, medicaments and biological substances status: Secondary | ICD-10-CM

## 2017-11-26 DIAGNOSIS — I10 Essential (primary) hypertension: Secondary | ICD-10-CM | POA: Diagnosis not present

## 2017-11-26 DIAGNOSIS — D696 Thrombocytopenia, unspecified: Secondary | ICD-10-CM | POA: Diagnosis not present

## 2017-11-26 DIAGNOSIS — H9190 Unspecified hearing loss, unspecified ear: Secondary | ICD-10-CM | POA: Diagnosis not present

## 2017-11-26 DIAGNOSIS — F0390 Unspecified dementia without behavioral disturbance: Secondary | ICD-10-CM | POA: Diagnosis present

## 2017-11-26 DIAGNOSIS — E1151 Type 2 diabetes mellitus with diabetic peripheral angiopathy without gangrene: Secondary | ICD-10-CM | POA: Diagnosis not present

## 2017-11-26 DIAGNOSIS — S069X9A Unspecified intracranial injury with loss of consciousness of unspecified duration, initial encounter: Secondary | ICD-10-CM | POA: Diagnosis not present

## 2017-11-26 DIAGNOSIS — R1314 Dysphagia, pharyngoesophageal phase: Secondary | ICD-10-CM | POA: Diagnosis not present

## 2017-11-26 DIAGNOSIS — E119 Type 2 diabetes mellitus without complications: Secondary | ICD-10-CM | POA: Insufficient documentation

## 2017-11-26 DIAGNOSIS — I491 Atrial premature depolarization: Secondary | ICD-10-CM | POA: Diagnosis not present

## 2017-11-26 DIAGNOSIS — G4733 Obstructive sleep apnea (adult) (pediatric): Secondary | ICD-10-CM | POA: Diagnosis not present

## 2017-11-26 DIAGNOSIS — N401 Enlarged prostate with lower urinary tract symptoms: Secondary | ICD-10-CM | POA: Diagnosis not present

## 2017-11-26 DIAGNOSIS — Z79899 Other long term (current) drug therapy: Secondary | ICD-10-CM

## 2017-11-26 DIAGNOSIS — N400 Enlarged prostate without lower urinary tract symptoms: Secondary | ICD-10-CM

## 2017-11-26 DIAGNOSIS — R000 Tachycardia, unspecified: Secondary | ICD-10-CM | POA: Diagnosis not present

## 2017-11-26 DIAGNOSIS — K449 Diaphragmatic hernia without obstruction or gangrene: Secondary | ICD-10-CM | POA: Diagnosis present

## 2017-11-26 DIAGNOSIS — I69320 Aphasia following cerebral infarction: Secondary | ICD-10-CM

## 2017-11-26 DIAGNOSIS — R0902 Hypoxemia: Secondary | ICD-10-CM | POA: Diagnosis not present

## 2017-11-26 DIAGNOSIS — Z91013 Allergy to seafood: Secondary | ICD-10-CM

## 2017-11-26 LAB — COMPREHENSIVE METABOLIC PANEL
ALT: 15 U/L — ABNORMAL LOW (ref 17–63)
AST: 19 U/L (ref 15–41)
Albumin: 3.6 g/dL (ref 3.5–5.0)
Alkaline Phosphatase: 67 U/L (ref 38–126)
Anion gap: 12 (ref 5–15)
BUN: 16 mg/dL (ref 6–20)
CO2: 25 mmol/L (ref 22–32)
Calcium: 9.7 mg/dL (ref 8.9–10.3)
Chloride: 99 mmol/L — ABNORMAL LOW (ref 101–111)
Creatinine, Ser: 1.12 mg/dL (ref 0.61–1.24)
GFR calc Af Amer: >60 mL/min (ref >60)
GFR calc non Af Amer: 59 mL/min — ABNORMAL LOW (ref >60)
Glucose, Bld: 214 mg/dL — ABNORMAL HIGH (ref 65–99)
Potassium: 4.1 mmol/L (ref 3.5–5.1)
Sodium: 136 mmol/L (ref 135–145)
Total Bilirubin: 1.1 mg/dL (ref 0.3–1.2)
Total Protein: 7.4 g/dL (ref 6.5–8.1)

## 2017-11-26 LAB — URINALYSIS, ROUTINE W REFLEX MICROSCOPIC
Bilirubin Urine: NEGATIVE
Glucose, UA: 150 mg/dL — AB
Hgb urine dipstick: NEGATIVE
Ketones, ur: 20 mg/dL — AB
Leukocytes, UA: NEGATIVE
Nitrite: NEGATIVE
Protein, ur: NEGATIVE mg/dL
Specific Gravity, Urine: 1.015 (ref 1.005–1.030)
pH: 5 (ref 5.0–8.0)

## 2017-11-26 LAB — CBC WITH DIFFERENTIAL/PLATELET
Abs Immature Granulocytes: 0.1 10*3/uL (ref 0.0–0.1)
Basophils Absolute: 0 10*3/uL (ref 0.0–0.1)
Basophils Relative: 0 %
Eosinophils Absolute: 0 10*3/uL (ref 0.0–0.7)
Eosinophils Relative: 0 %
HCT: 44 % (ref 39.0–52.0)
Hemoglobin: 15.1 g/dL (ref 13.0–17.0)
Immature Granulocytes: 1 %
Lymphocytes Relative: 9 %
Lymphs Abs: 0.9 10*3/uL (ref 0.7–4.0)
MCH: 32 pg (ref 26.0–34.0)
MCHC: 34.3 g/dL (ref 30.0–36.0)
MCV: 93.2 fL (ref 78.0–100.0)
Monocytes Absolute: 1.1 10*3/uL — ABNORMAL HIGH (ref 0.1–1.0)
Monocytes Relative: 10 %
Neutro Abs: 8.4 10*3/uL — ABNORMAL HIGH (ref 1.7–7.7)
Neutrophils Relative %: 80 %
Platelets: 156 10*3/uL (ref 150–400)
RBC: 4.72 MIL/uL (ref 4.22–5.81)
RDW: 12.2 % (ref 11.5–15.5)
WBC: 10.5 10*3/uL (ref 4.0–10.5)

## 2017-11-26 LAB — CBG MONITORING, ED: Glucose-Capillary: 187 mg/dL — ABNORMAL HIGH (ref 65–99)

## 2017-11-26 MED ORDER — AMOXICILLIN-POT CLAVULANATE 875-125 MG PO TABS
1.0000 | ORAL_TABLET | Freq: Once | ORAL | Status: AC
  Administered 2017-11-26: 1 via ORAL
  Filled 2017-11-26: qty 1

## 2017-11-26 MED ORDER — AMOXICILLIN-POT CLAVULANATE 875-125 MG PO TABS: 1.0000 | ORAL_TABLET | Freq: Two times a day (BID) | ORAL | 0 refills | Status: DC

## 2017-11-26 MED ORDER — DOXYCYCLINE HYCLATE 100 MG PO CAPS: 100.0000 mg | ORAL_CAPSULE | Freq: Two times a day (BID) | ORAL | 0 refills | Status: DC

## 2017-11-26 MED ORDER — DOXYCYCLINE HYCLATE 100 MG PO TABS
100.0000 mg | ORAL_TABLET | Freq: Once | ORAL | Status: AC
Start: 2017-11-26 — End: 2017-11-26
  Administered 2017-11-26: 100 mg via ORAL
  Filled 2017-11-26: qty 1

## 2017-11-26 MED ORDER — SODIUM CHLORIDE 0.9 % IV BOLUS
1000.0000 mL | Freq: Once | INTRAVENOUS | Status: AC
  Administered 2017-11-26: 1000 mL via INTRAVENOUS

## 2017-11-26 MED ORDER — ACETAMINOPHEN 325 MG PO TABS
650.0000 mg | ORAL_TABLET | Freq: Once | ORAL | Status: AC
Start: 2017-11-26 — End: 2017-11-26
  Administered 2017-11-26: 650 mg via ORAL
  Filled 2017-11-26: qty 2

## 2017-11-26 NOTE — ED Triage Notes (Signed)
Family states that the patient has a 103 temp and very lethargic

## 2017-11-26 NOTE — ED Notes (Signed)
Results reviewed.  No changes in acuity at this time 

## 2017-11-26 NOTE — ED Notes (Signed)
Pt given [place] sandwich, states that at home he sometimes has trouble swallowing if he swallows fast or anything to dry.

## 2017-11-26 NOTE — ED Notes (Signed)
Pt was seen at [facility] earlier and it showed a possible pneumonia, he was sent home with antibiotics and then was worse tonight, not drinking or eating much and fell tonight Daughter gave tylenol about one hour ago, he's had his second dose of antibiotics tonight

## 2017-11-26 NOTE — ED Provider Notes (Signed)
Emergency Department Provider Note   I have reviewed the triage vital signs and the nursing notes.   HISTORY  Chief Complaint Weakness and Fever   HPI [name] [name] is a 84 y.o. male whose medical history and history of present illness are supplemented by daughter and wife.  Daughter is a Designer, jewellery.  I saw the patient has a long history of neurologic issues including a couple strokes recently saw a neurologist who was concern for possible parkinsonism versus early dementia to go along with his strokes.  He has a history of having significant weakness in bilateral lower extremities to the point where when he leans forward too much his legs can give out any falls and hurts his knees.  This is not new and is been going on for a while however seems to have worsened over the last 2 to 3 days.  He also had decreased energy over the last few days and had a temperature this morning of 102.0.  Patient does endorse a bit shortness of breath, cough, dark urine and increased urination along with elevated blood sugars.  He does have a history of diabetes but has no changes in his medications otherwise.  No changes in his thyroid medications. No other associated or modifying symptoms.    Past Medical History:  Diagnosis Date  . AAA (abdominal aortic aneurysm) ([place])   . 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 Diabetes mellitus without complication ([place])   . Hiatal hernia   . HOH (hard of hearing)   . Hyperlipidemia   . Stroke [facility])     Patient Active Problem List   Diagnosis Date Noted  . Cerebrovascular accident (CVA) ([place]) 09/09/2017  . Word finding difficulty   . Cerebral thrombosis with cerebral infarction 01/17/2017  . Aphasia   . Hyperlipidemia   . Essential hypertension   . Expressive aphasia 01/16/2017  . Diabetes mellitus ([place]) 01/16/2017  . 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 Aftercare following surgery of the circulatory system, [place] 12/09/2013  . Abdominal  aneurysm without mention of rupture 12/09/2013  . PAD (peripheral artery disease) ([place]) 04/14/2013    Past Surgical History:  Procedure Laterality Date  . ABDOMINAL AORTIC ANEURYSM REPAIR  10-2003   also had iliac aneurysm repair  . CHOLECYSTECTOMY     Laparoscopic cholecystectomy  . EYE SURGERY Bilateral    cataracts  . HERNIA REPAIR    . LOOP RECORDER INSERTION N/A 09/10/2017   Procedure: LOOP RECORDER INSERTION;  Surgeon: Deboraha Sprang, MD;  Location: [place] CV LAB;  Service: Cardiovascular;  Laterality: N/A;  . TEE WITHOUT CARDIOVERSION N/A 09/10/2017   Procedure: TRANSESOPHAGEAL ECHOCARDIOGRAM (TEE);  Surgeon: Jerline Pain, MD;  Location: [facility] ENDOSCOPY;  Service: Cardiovascular;  Laterality: N/A;    Current Outpatient Rx  . Order #: [id number] Class: Print  . Order #: [id number] Class: Normal  . Order #: [id number] Class: Historical Med  . Order #: [id number] Class: Normal  . Order #: [id number] Class: Historical Med  . Order #: [id number] Class: Historical Med  . Order #: [id number] Class: Print  . Order #: [id number] Class: Historical Med  . Order #: [id number] Class: Historical Med  . Order #: [id number] Class: Historical Med  . Order #: [id number] Class: Historical Med  . Order #: [id number] Class: Historical Med  . Order #: [id number] Class: Historical Med  . Order #: [id number] Class: Historical Med  . Order #: [id number] Class: Historical Med  . Order #: [id number] Class: Historical Med    Allergies Actos [pioglitazone]; Allegra [fexofenadine]; Iodine; Metformin and  related; Onglyza [saxagliptin]; and Shellfish allergy  Family History  Problem Relation Age of Onset  . Arthritis Mother        Rheumatoid arthritis    Social History Social History   Tobacco Use  . Smoking status: Never Smoker  . Smokeless tobacco: Never Used  Substance Use Topics  . Alcohol use: No  . Drug use: No    Review of Systems  All other systems negative except as documented in the HPI. All pertinent  positives and negatives as reviewed in the HPI. ____________________________________________   PHYSICAL EXAM:  VITAL SIGNS: ED Triage Vitals  Enc Vitals Group     BP 11/26/17 0850 128/62     Pulse Rate 11/26/17 0850 85     Resp 11/26/17 0850 16     Temp 11/26/17 0850 98.2 F (36.8 C)     Temp Source 11/26/17 0850 Oral     SpO2 11/26/17 0850 97 %    Constitutional: Alert and oriented. Well appearing and in no acute distress. Eyes: Conjunctivae are normal. PERRL. EOMI. Head: Atraumatic. Nose: No congestion/rhinnorhea. Mouth/Throat: Mucous membranes are moist.  Oropharynx non-erythematous. Neck: No stridor.  No meningeal signs.   Cardiovascular: Normal rate, regular rhythm. Good peripheral circulation. Grossly normal heart sounds.   Respiratory: Normal respiratory effort.  No retractions. Lungs with crackles RLL. Gastrointestinal: Soft and nontender. No distention.  Musculoskeletal: No lower extremity tenderness nor edema. No gross deformities of extremities. Neurologic:  Normal speech and language. No gross focal neurologic deficits are appreciated.  Skin:  Skin is warm, dry and intact. No rash noted.   ____________________________________________   LABS (all labs ordered are listed, but only abnormal results are displayed)  Labs Reviewed  COMPREHENSIVE METABOLIC PANEL - Abnormal; Notable for the following components:      Result Value   Chloride 99 (*)    Glucose, Bld 214 (*)    ALT 15 (*)    GFR calc non Af Amer 59 (*)    All other components within normal limits  CBC WITH DIFFERENTIAL/PLATELET - Abnormal; Notable for the following components:   Neutro Abs 8.4 (*)    Monocytes Absolute 1.1 (*)    All other components within normal limits  URINALYSIS, ROUTINE W REFLEX MICROSCOPIC - Abnormal; Notable for the following components:   Glucose, UA 150 (*)    Ketones, ur 20 (*)    All other components within normal limits  CBG MONITORING, ED - Abnormal; Notable for the  following components:   Glucose-Capillary 187 (*)    All other components within normal limits   ____________________________________________   RADIOLOGY  Dg Chest 2 View  Result Date: 11/26/2017 CLINICAL DATA:  Weakness and fevers EXAM: CHEST - 2 VIEW COMPARISON:  02/04/2011 FINDINGS: Cardiac shadow is within normal limits. Loop recorder is noted. Some patchy atelectatic changes are noted in the right lower lobe laterally. No sizable effusion is seen. No focal confluent infiltrate is noted. No bony abnormality is seen. IMPRESSION: Patchy changes in the right lung base as described. This may represent some early infiltrate. Electronically Signed   By: Inez Catalina M.D.   On: 11/26/2017 09:10    ____________________________________________   PROCEDURES  Procedure(s) performed:   Procedures   ____________________________________________   INITIAL IMPRESSION / ASSESSMENT AND PLAN / ED COURSE  I suspect patient's weakness is from dehydration.  This is probably multifactorial secondary to pneumonia and hyperglycemia causing increased urinary output.  He has crackles in his lower lobe consistent with his  chest x-ray.  His white blood cell count is okay.  His vital signs are okay.  He had a temperature 102 at home today.  He has some questionable aspiration issues as he says he does choke up a little bit every time he eats however has not been evaluated for this recently.  Does have history of hiatal hernia.  We will treat him for commute acquired pneumonia.  Patient feels much better and remained stable after a prolonged emergency department visit and receiving IV fluids.  No focal deficits to suggest repeat cardiovascular accident.  Does not meet sepsis criteria or any other indication for admission.  Tolerating p.o. and at baseline per him and his family will discharge for close outpatient follow-up   Pertinent labs & imaging results that were available during my care of the patient were  reviewed by me and considered in my medical decision making (see chart for details).  ____________________________________________  FINAL CLINICAL IMPRESSION(S) / ED DIAGNOSES  Final diagnoses:  Community acquired pneumonia of right lower lobe of lung ([place])  Dehydration     MEDICATIONS GIVEN DURING THIS VISIT:  Medications  sodium chloride 0.9 % bolus 1,000 mL (0 mLs Intravenous Stopped 11/26/17 1405)  amoxicillin-clavulanate (AUGMENTIN) 875-125 MG per tablet 1 tablet (1 tablet Oral Given 11/26/17 1408)  doxycycline (VIBRA-TABS) tablet 100 mg (100 mg Oral Given 11/26/17 1408)  acetaminophen (TYLENOL) tablet 650 mg (650 mg Oral Given 11/26/17 1443)     NEW OUTPATIENT MEDICATIONS STARTED DURING THIS VISIT:  Discharge Medication List as of 11/26/2017  2:38 PM    START taking these medications   Details  amoxicillin-clavulanate (AUGMENTIN) 875-125 MG tablet Take 1 tablet by mouth 2 (two) times daily. One po bid x 7 days, Starting Wed 11/26/2017, Print    doxycycline (VIBRAMYCIN) 100 MG capsule Take 1 capsule (100 mg total) by mouth 2 (two) times daily. One po bid x 7 days, Starting Wed 11/26/2017, Print        Note:  This note was prepared with assistance of Dragon voice recognition software. Occasional wrong-word or sound-a-like substitutions may have occurred due to the inherent limitations of voice recognition software.   Merrily Pew, MD 11/26/17 [phone]

## 2017-11-26 NOTE — ED Notes (Signed)
ED Provider at bedside. 

## 2017-11-26 NOTE — ED Triage Notes (Signed)
To ED for eval of weakness x2 days. Fever this am of 102 per EMS- Tylenol given. No fever in triage. 20g IV placed in right FA PTA. CBG 204 by EMS

## 2017-11-27 ENCOUNTER — Inpatient Hospital Stay (HOSPITAL_COMMUNITY): Payer: PPO

## 2017-11-27 ENCOUNTER — Encounter (HOSPITAL_COMMUNITY): Payer: Self-pay | Admitting: Internal Medicine

## 2017-11-27 ENCOUNTER — Emergency Department (HOSPITAL_COMMUNITY): Payer: PPO

## 2017-11-27 ENCOUNTER — Other Ambulatory Visit: Payer: Self-pay

## 2017-11-27 DIAGNOSIS — I69320 Aphasia following cerebral infarction: Secondary | ICD-10-CM | POA: Diagnosis not present

## 2017-11-27 DIAGNOSIS — Z79890 Hormone replacement therapy (postmenopausal): Secondary | ICD-10-CM | POA: Diagnosis not present

## 2017-11-27 DIAGNOSIS — E876 Hypokalemia: Secondary | ICD-10-CM | POA: Diagnosis present

## 2017-11-27 DIAGNOSIS — E039 Hypothyroidism, unspecified: Secondary | ICD-10-CM | POA: Diagnosis present

## 2017-11-27 DIAGNOSIS — J690 Pneumonitis due to inhalation of food and vomit: Secondary | ICD-10-CM | POA: Diagnosis present

## 2017-11-27 DIAGNOSIS — E1165 Type 2 diabetes mellitus with hyperglycemia: Secondary | ICD-10-CM | POA: Diagnosis present

## 2017-11-27 DIAGNOSIS — Z7902 Long term (current) use of antithrombotics/antiplatelets: Secondary | ICD-10-CM | POA: Diagnosis not present

## 2017-11-27 DIAGNOSIS — E1159 Type 2 diabetes mellitus with other circulatory complications: Secondary | ICD-10-CM | POA: Diagnosis present

## 2017-11-27 DIAGNOSIS — D696 Thrombocytopenia, unspecified: Secondary | ICD-10-CM | POA: Diagnosis present

## 2017-11-27 DIAGNOSIS — J189 Pneumonia, unspecified organism: Secondary | ICD-10-CM

## 2017-11-27 DIAGNOSIS — F0390 Unspecified dementia without behavioral disturbance: Secondary | ICD-10-CM | POA: Diagnosis present

## 2017-11-27 DIAGNOSIS — R1314 Dysphagia, pharyngoesophageal phase: Secondary | ICD-10-CM | POA: Diagnosis present

## 2017-11-27 DIAGNOSIS — Z79899 Other long term (current) drug therapy: Secondary | ICD-10-CM | POA: Diagnosis not present

## 2017-11-27 DIAGNOSIS — R338 Other retention of urine: Secondary | ICD-10-CM | POA: Diagnosis not present

## 2017-11-27 DIAGNOSIS — Z7984 Long term (current) use of oral hypoglycemic drugs: Secondary | ICD-10-CM | POA: Diagnosis not present

## 2017-11-27 DIAGNOSIS — I10 Essential (primary) hypertension: Secondary | ICD-10-CM | POA: Diagnosis present

## 2017-11-27 DIAGNOSIS — Z7982 Long term (current) use of aspirin: Secondary | ICD-10-CM | POA: Diagnosis not present

## 2017-11-27 DIAGNOSIS — E1151 Type 2 diabetes mellitus with diabetic peripheral angiopathy without gangrene: Secondary | ICD-10-CM | POA: Diagnosis present

## 2017-11-27 DIAGNOSIS — K449 Diaphragmatic hernia without obstruction or gangrene: Secondary | ICD-10-CM | POA: Diagnosis present

## 2017-11-27 DIAGNOSIS — E785 Hyperlipidemia, unspecified: Secondary | ICD-10-CM | POA: Diagnosis present

## 2017-11-27 DIAGNOSIS — N401 Enlarged prostate with lower urinary tract symptoms: Secondary | ICD-10-CM | POA: Diagnosis not present

## 2017-11-27 DIAGNOSIS — I739 Peripheral vascular disease, unspecified: Secondary | ICD-10-CM | POA: Diagnosis not present

## 2017-11-27 DIAGNOSIS — Z9049 Acquired absence of other specified parts of digestive tract: Secondary | ICD-10-CM | POA: Diagnosis not present

## 2017-11-27 DIAGNOSIS — K222 Esophageal obstruction: Secondary | ICD-10-CM | POA: Diagnosis present

## 2017-11-27 DIAGNOSIS — Z9841 Cataract extraction status, right eye: Secondary | ICD-10-CM | POA: Diagnosis not present

## 2017-11-27 DIAGNOSIS — R1110 Vomiting, unspecified: Secondary | ICD-10-CM | POA: Diagnosis present

## 2017-11-27 DIAGNOSIS — R339 Retention of urine, unspecified: Secondary | ICD-10-CM | POA: Diagnosis present

## 2017-11-27 DIAGNOSIS — W19XXXA Unspecified fall, initial encounter: Secondary | ICD-10-CM | POA: Diagnosis present

## 2017-11-27 DIAGNOSIS — K224 Dyskinesia of esophagus: Secondary | ICD-10-CM | POA: Diagnosis present

## 2017-11-27 DIAGNOSIS — H9190 Unspecified hearing loss, unspecified ear: Secondary | ICD-10-CM | POA: Diagnosis present

## 2017-11-27 DIAGNOSIS — Z9842 Cataract extraction status, left eye: Secondary | ICD-10-CM | POA: Diagnosis not present

## 2017-11-27 LAB — BASIC METABOLIC PANEL
Anion gap: 12 (ref 5–15)
BUN: 13 mg/dL (ref 6–20)
CO2: 18 mmol/L — ABNORMAL LOW (ref 22–32)
Calcium: 8.7 mg/dL — ABNORMAL LOW (ref 8.9–10.3)
Chloride: 106 mmol/L (ref 101–111)
Creatinine, Ser: 0.93 mg/dL (ref 0.61–1.24)
GFR calc Af Amer: >60 mL/min (ref >60)
GFR calc non Af Amer: >60 mL/min (ref >60)
Glucose, Bld: 255 mg/dL — ABNORMAL HIGH (ref 65–99)
Potassium: 3.7 mmol/L (ref 3.5–5.1)
Sodium: 136 mmol/L (ref 135–145)

## 2017-11-27 LAB — LACTIC ACID, PLASMA
Lactic Acid, Venous: 1.6 mmol/L (ref 0.5–1.9)
Lactic Acid, Venous: 1.2 mmol/L (ref 0.5–1.9)

## 2017-11-27 LAB — URINALYSIS, ROUTINE W REFLEX MICROSCOPIC
Bacteria, UA: NONE SEEN
Bilirubin Urine: NEGATIVE
Glucose, UA: >=500 mg/dL — AB
Ketones, ur: 20 mg/dL — AB
Leukocytes, UA: NEGATIVE
Nitrite: NEGATIVE
Protein, ur: 30 mg/dL — AB
Specific Gravity, Urine: 1.016 (ref 1.005–1.030)
pH: 6 (ref 5.0–8.0)

## 2017-11-27 LAB — CBC WITH DIFFERENTIAL/PLATELET
Basophils Absolute: 0 10*3/uL (ref 0.0–0.1)
Basophils Absolute: 0 10*3/uL (ref 0.0–0.1)
Basophils Relative: 0 %
Basophils Relative: 0 %
Eosinophils Absolute: 0 10*3/uL (ref 0.0–0.7)
Eosinophils Absolute: 0 10*3/uL (ref 0.0–0.7)
Eosinophils Relative: 0 %
Eosinophils Relative: 0 %
HCT: 38.4 % — ABNORMAL LOW (ref 39.0–52.0)
HCT: 39.5 % (ref 39.0–52.0)
Hemoglobin: 13.6 g/dL (ref 13.0–17.0)
Hemoglobin: 14.1 g/dL (ref 13.0–17.0)
Lymphocytes Relative: 5 %
Lymphocytes Relative: 5 %
Lymphs Abs: 0.5 10*3/uL — ABNORMAL LOW (ref 0.7–4.0)
Lymphs Abs: 0.4 10*3/uL — ABNORMAL LOW (ref 0.7–4.0)
MCH: 32.9 pg (ref 26.0–34.0)
MCH: 33.3 pg (ref 26.0–34.0)
MCHC: 35.4 g/dL (ref 30.0–36.0)
MCHC: 35.7 g/dL (ref 30.0–36.0)
MCV: 93 fL (ref 78.0–100.0)
MCV: 93.2 fL (ref 78.0–100.0)
Monocytes Absolute: 1 10*3/uL (ref 0.1–1.0)
Monocytes Absolute: 1 10*3/uL (ref 0.1–1.0)
Monocytes Relative: 10 %
Monocytes Relative: 11 %
Neutro Abs: 8.1 10*3/uL — ABNORMAL HIGH (ref 1.7–7.7)
Neutro Abs: 7.4 10*3/uL (ref 1.7–7.7)
Neutrophils Relative %: 85 %
Neutrophils Relative %: 84 %
Platelets: 128 10*3/uL — ABNORMAL LOW (ref 150–400)
Platelets: 127 10*3/uL — ABNORMAL LOW (ref 150–400)
RBC: 4.13 MIL/uL — ABNORMAL LOW (ref 4.22–5.81)
RBC: 4.24 MIL/uL (ref 4.22–5.81)
RDW: 12.5 % (ref 11.5–15.5)
RDW: 12.6 % (ref 11.5–15.5)
WBC: 9.6 10*3/uL (ref 4.0–10.5)
WBC: 8.9 10*3/uL (ref 4.0–10.5)

## 2017-11-27 LAB — TROPONIN I
Troponin I: <0.03 ng/mL (ref <0.03)
Troponin I: <0.03 ng/mL (ref <0.03)

## 2017-11-27 LAB — HEPATIC FUNCTION PANEL
ALT: 18 U/L (ref 17–63)
AST: 25 U/L (ref 15–41)
Albumin: 3.3 g/dL — ABNORMAL LOW (ref 3.5–5.0)
Alkaline Phosphatase: 63 U/L (ref 38–126)
Bilirubin, Direct: 0.2 mg/dL (ref 0.1–0.5)
Indirect Bilirubin: 0.9 mg/dL (ref 0.3–0.9)
Total Bilirubin: 1.1 mg/dL (ref 0.3–1.2)
Total Protein: 6.5 g/dL (ref 6.5–8.1)

## 2017-11-27 LAB — COMPREHENSIVE METABOLIC PANEL
ALT: 17 U/L (ref 17–63)
AST: 21 U/L (ref 15–41)
Albumin: 3.6 g/dL (ref 3.5–5.0)
Alkaline Phosphatase: 66 U/L (ref 38–126)
Anion gap: 10 (ref 5–15)
BUN: 16 mg/dL (ref 6–20)
CO2: 21 mmol/L — ABNORMAL LOW (ref 22–32)
Calcium: 9.1 mg/dL (ref 8.9–10.3)
Chloride: 104 mmol/L (ref 101–111)
Creatinine, Ser: 1.06 mg/dL (ref 0.61–1.24)
GFR calc Af Amer: >60 mL/min (ref >60)
GFR calc non Af Amer: >60 mL/min (ref >60)
Glucose, Bld: 331 mg/dL — ABNORMAL HIGH (ref 65–99)
Potassium: 3.8 mmol/L (ref 3.5–5.1)
Sodium: 135 mmol/L (ref 135–145)
Total Bilirubin: 0.6 mg/dL (ref 0.3–1.2)
Total Protein: 6.7 g/dL (ref 6.5–8.1)

## 2017-11-27 LAB — MRSA PCR SCREENING: MRSA by PCR: NEGATIVE

## 2017-11-27 LAB — LIPASE, BLOOD: Lipase: 20 U/L (ref 11–51)

## 2017-11-27 LAB — GLUCOSE, CAPILLARY
Glucose-Capillary: 147 mg/dL — ABNORMAL HIGH (ref 65–99)
Glucose-Capillary: 253 mg/dL — ABNORMAL HIGH (ref 65–99)

## 2017-11-27 LAB — CBG MONITORING, ED
Glucose-Capillary: 243 mg/dL — ABNORMAL HIGH (ref 65–99)
Glucose-Capillary: 225 mg/dL — ABNORMAL HIGH (ref 65–99)

## 2017-11-27 LAB — STREP PNEUMONIAE URINARY ANTIGEN: Strep Pneumo Urinary Antigen: NEGATIVE

## 2017-11-27 MED ORDER — SODIUM CHLORIDE 0.9 % IV BOLUS
500.0000 mL | Freq: Once | INTRAVENOUS | Status: AC
  Administered 2017-11-27: 500 mL via INTRAVENOUS

## 2017-11-27 MED ORDER — LEVOTHYROXINE SODIUM 75 MCG PO TABS
75.0000 ug | ORAL_TABLET | Freq: Every day | ORAL | Status: DC
  Administered 2017-11-27 – 2017-11-30 (×4): 75 ug via ORAL
  Filled 2017-11-27 (×4): qty 1

## 2017-11-27 MED ORDER — SODIUM CHLORIDE 0.9 % IV SOLN
INTRAVENOUS | Status: DC
  Administered 2017-11-27: 04:00:00 via INTRAVENOUS

## 2017-11-27 MED ORDER — ROSUVASTATIN CALCIUM 10 MG PO TABS
10.0000 mg | ORAL_TABLET | Freq: Every day | ORAL | Status: DC
  Administered 2017-11-27 – 2017-11-29 (×2): 10 mg via ORAL
  Filled 2017-11-27 (×3): qty 1

## 2017-11-27 MED ORDER — SODIUM CHLORIDE 0.9 % IV SOLN
INTRAVENOUS | Status: DC
  Administered 2017-11-27 – 2017-11-29 (×4): via INTRAVENOUS

## 2017-11-27 MED ORDER — SODIUM CHLORIDE 0.9 % IV SOLN
1.0000 g | Freq: Two times a day (BID) | INTRAVENOUS | Status: DC
  Administered 2017-11-27 (×2): 1 g via INTRAVENOUS
  Filled 2017-11-27 (×4): qty 1

## 2017-11-27 MED ORDER — ACETAMINOPHEN 650 MG RE SUPP
650.0000 mg | Freq: Four times a day (QID) | RECTAL | Status: DC | PRN
  Filled 2017-11-27: qty 1

## 2017-11-27 MED ORDER — IOHEXOL 300 MG/ML  SOLN
15.0000 mL | Freq: Once | INTRAMUSCULAR | Status: AC | PRN
  Administered 2017-11-27: 15 mL via ORAL

## 2017-11-27 MED ORDER — VANCOMYCIN HCL 10 G IV SOLR
1250.0000 mg | Freq: Once | INTRAVENOUS | Status: AC
  Administered 2017-11-27: 1250 mg via INTRAVENOUS
  Filled 2017-11-27: qty 1250

## 2017-11-27 MED ORDER — CEFEPIME HCL 1 G IJ SOLR
1.0000 g | Freq: Once | INTRAMUSCULAR | Status: AC
  Administered 2017-11-27: 1 g via INTRAVENOUS
  Filled 2017-11-27: qty 1

## 2017-11-27 MED ORDER — VANCOMYCIN HCL IN DEXTROSE 750-5 MG/150ML-% IV SOLN: 750.0000 mg | INTRAVENOUS | Status: DC

## 2017-11-27 MED ORDER — TAMSULOSIN HCL 0.4 MG PO CAPS
0.4000 mg | ORAL_CAPSULE | Freq: Every day | ORAL | Status: DC
  Filled 2017-11-27: qty 1

## 2017-11-27 MED ORDER — SODIUM CHLORIDE 0.9 % IV SOLN
Freq: Once | INTRAVENOUS | Status: AC
  Administered 2017-11-27: 01:00:00 via INTRAVENOUS

## 2017-11-27 MED ORDER — VITAMIN B-12 1000 MCG PO TABS
1000.0000 ug | ORAL_TABLET | Freq: Every day | ORAL | Status: DC
  Administered 2017-11-27 – 2017-11-30 (×4): 1000 ug via ORAL
  Filled 2017-11-27 (×5): qty 1

## 2017-11-27 MED ORDER — ENOXAPARIN SODIUM 40 MG/0.4ML ~~LOC~~ SOLN
40.0000 mg | SUBCUTANEOUS | Status: DC
  Administered 2017-11-27 – 2017-11-29 (×2): 40 mg via SUBCUTANEOUS
  Filled 2017-11-27 (×3): qty 0.4

## 2017-11-27 MED ORDER — PANTOPRAZOLE SODIUM 40 MG PO TBEC
40.0000 mg | DELAYED_RELEASE_TABLET | Freq: Every day | ORAL | Status: DC
  Administered 2017-11-27 – 2017-11-30 (×4): 40 mg via ORAL
  Filled 2017-11-27 (×4): qty 1

## 2017-11-27 MED ORDER — INSULIN ASPART 100 UNIT/ML ~~LOC~~ SOLN
0.0000 [IU] | Freq: Three times a day (TID) | SUBCUTANEOUS | Status: DC
  Administered 2017-11-27 (×2): 3 [IU] via SUBCUTANEOUS
  Administered 2017-11-27: 1 [IU] via SUBCUTANEOUS
  Administered 2017-11-28 – 2017-11-29 (×4): 3 [IU] via SUBCUTANEOUS
  Administered 2017-11-29: 5 [IU] via SUBCUTANEOUS
  Administered 2017-11-29 – 2017-11-30 (×3): 3 [IU] via SUBCUTANEOUS
  Filled 2017-11-27 (×2): qty 1

## 2017-11-27 MED ORDER — ACETAMINOPHEN 325 MG PO TABS
650.0000 mg | ORAL_TABLET | Freq: Four times a day (QID) | ORAL | Status: DC | PRN
  Administered 2017-11-27 – 2017-11-29 (×5): 650 mg via ORAL
  Filled 2017-11-27 (×6): qty 2

## 2017-11-27 MED ORDER — LIDOCAINE HCL URETHRAL/MUCOSAL 2 % EX GEL
1.0000 "application " | Freq: Once | CUTANEOUS | Status: AC
  Administered 2017-11-27: 1 via URETHRAL
  Filled 2017-11-27: qty 5

## 2017-11-27 MED ORDER — HYDRALAZINE HCL 20 MG/ML IJ SOLN: 5.0000 mg | Freq: Four times a day (QID) | INTRAMUSCULAR | Status: DC | PRN

## 2017-11-27 MED ORDER — CLOPIDOGREL BISULFATE 75 MG PO TABS
75.0000 mg | ORAL_TABLET | Freq: Every day | ORAL | Status: DC
  Administered 2017-11-27 – 2017-11-30 (×4): 75 mg via ORAL
  Filled 2017-11-27 (×4): qty 1

## 2017-11-27 MED ORDER — ONDANSETRON HCL 4 MG/2ML IJ SOLN: 4.0000 mg | Freq: Four times a day (QID) | INTRAMUSCULAR | Status: DC | PRN

## 2017-11-27 MED ORDER — DIAZEPAM 5 MG PO TABS: 2.5000 mg | ORAL_TABLET | Freq: Every evening | ORAL | Status: DC | PRN

## 2017-11-27 MED ORDER — ONDANSETRON HCL 4 MG PO TABS: 4.0000 mg | ORAL_TABLET | Freq: Four times a day (QID) | ORAL | Status: DC | PRN

## 2017-11-27 NOTE — H&P (Addendum)
History and Physical    [name] [name] NWG:[id number] DOB: December 09, 1932 DOA: 11/26/2017  PCP: [name], MD  Patient coming from: Home.  Chief Complaint: Fever and weakness.  HPI: [name] [name] is a 84 y.o. male with history of stroke in March 2019 2 months ago, abdominal aortic aneurysm status post repair, diabetes mellitus type 2 had come to the ER yesterday morning after patient was found to be increasingly weak and running a fever.  As per the patient's daughter who provided the history along with the patient and wife patient was not doing well and feeling uncomfortable the day before.  Subsequently patient started developing fever.  Has not had any cough nausea vomiting abdominal pain or any chest pain.  Since patient was still having persistent fever patient was brought to the ER and chest x-ray shows features concerning for infiltrates and was placed on Augmentin and doxycycline and discharged home.  After going home patient started feeling weak and had a fall.  Did not lose consciousness.  Patient was brought back to the ER.  ED Course: In the ER patient is alert awake oriented close commands moves all extremities.  CT head was unremarkable.  Blood cultures were obtained and started on empiric antibiotics for healthcare associated pneumonia since patient was recently admitted for stroke.  Review of Systems: As per HPI, rest all negative.   Past Medical History:  Diagnosis Date  . AAA (abdominal aortic aneurysm) ([place])   . 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 Diabetes mellitus without complication ([place])   . Hiatal hernia   . HOH (hard of hearing)   . Hyperlipidemia   . Stroke [facility])     Past Surgical History:  Procedure Laterality Date  . ABDOMINAL AORTIC ANEURYSM REPAIR  10-2003   also had iliac aneurysm repair  . CHOLECYSTECTOMY     Laparoscopic cholecystectomy  . EYE SURGERY Bilateral    cataracts  . HERNIA REPAIR    . LOOP RECORDER INSERTION N/A 09/10/2017   Procedure: LOOP RECORDER INSERTION;  Surgeon: Deboraha Sprang, MD;  Location: [place] CV LAB;  Service: Cardiovascular;  Laterality: N/A;  . TEE WITHOUT CARDIOVERSION N/A 09/10/2017   Procedure: TRANSESOPHAGEAL ECHOCARDIOGRAM (TEE);  Surgeon: Jerline Pain, MD;  Location: [facility] ENDOSCOPY;  Service: Cardiovascular;  Laterality: N/A;     reports that he has never smoked. He has never used smokeless tobacco. He reports that he does not drink alcohol or use drugs.  Allergies  Allergen Reactions  . Actos [Pioglitazone] Swelling  . Allegra [Fexofenadine] Other (See Comments)    stimulant  . Iodine Swelling and Other (See Comments)    DEADLY SICK  . Metformin And Related Diarrhea    Pt states that he has a reaction to the extended release metformin but takes the regular  . Onglyza [Saxagliptin] Other (See Comments)    Weight loss  . Shellfish Allergy     DEADLY SICK    Family History  Problem Relation Age of Onset  . Arthritis Mother        Rheumatoid arthritis    Prior to Admission medications   Medication Sig Start Date End Date Taking? Authorizing Provider  amoxicillin-clavulanate (AUGMENTIN) 875-125 MG tablet Take 1 tablet by mouth 2 (two) times daily. One po bid x 7 days 11/26/17  Yes Mesner, Corene Cornea, MD  cholecalciferol (VITAMIN D) 1000 units tablet Take 1,000 Units by mouth daily with supper.   Yes [provider]  clopidogrel (PLAVIX) 75 MG  tablet Take 1 tablet (75 mg total) by mouth daily. 09/11/17  Yes Rama, Venetia Maxon, MD  Coenzyme Q10 (COQ10) 100 MG CAPS Take 100 mg by mouth daily.   Yes [provider]  diazepam (VALIUM) 5 MG tablet Take 2.5-5 mg by mouth at bedtime as needed for anxiety (restless legs).   Yes [provider]  doxycycline (VIBRAMYCIN) 100 MG capsule Take 1 capsule (100 mg total) by mouth 2 (two) times daily. One po bid x 7 days 11/26/17  Yes Mesner, Corene Cornea, MD  levothyroxine (SYNTHROID, LEVOTHROID) 75 MCG tablet Take 75 mcg by mouth  daily before breakfast.  09/15/13  Yes [provider]  loratadine (CLARITIN) 10 MG tablet Take 10 mg by mouth daily.    Yes [provider]  metFORMIN (GLUCOPHAGE) 500 MG tablet Take 1,000 mg by mouth 2 (two) times daily. 11/09/17  Yes [provider]  Multiple Vitamin (MULTIVITAMIN WITH MINERALS) TABS tablet Take 1 tablet by mouth daily.   Yes [provider]  Omega-3 1000 MG CAPS Take 1,000 mg by mouth daily.   Yes [provider]  omeprazole (PRILOSEC) 20 MG capsule Take 20 mg by mouth at bedtime.    Yes [provider]  OVER THE COUNTER MEDICATION Take 2 tablets by mouth daily. OTC Bee Caps Dietary Supplement   Yes [provider]  rosuvastatin (CRESTOR) 10 MG tablet Take 10 mg by mouth at bedtime.    Yes [provider]  sildenafil (REVATIO) 20 MG tablet Take 40-100 mg by mouth as needed (erectile dysfunction). For sexual activity 10/31/16  Yes [provider]  vitamin B-12 (CYANOCOBALAMIN) 1000 MCG tablet Take 1,000 mcg by mouth daily.   Yes [provider]  aspirin 325 MG tablet Take 1 tablet (325 mg total) by mouth daily. Stop in 3 weeks and then just take Plavix alone. Patient not taking: Reported on 11/26/2017 09/10/17   Rama, Venetia Maxon, MD    Physical Exam: Vitals:   11/26/17 2257 11/26/17 2318 11/27/17 0000 11/27/17 0100  BP: (!) 160/79  (!) 151/80 (!) 156/71  Pulse: (!) 105  99 95  Resp: 20  20 19   Temp: (!) 100.4 F (38 C) 99 F (37.2 C)    TempSrc: Oral Oral    SpO2: 95%  95% 98%      Constitutional: Moderately built and nourished. Vitals:   11/26/17 2257 11/26/17 2318 11/27/17 0000 11/27/17 0100  BP: (!) 160/79  (!) 151/80 (!) 156/71  Pulse: (!) 105  99 95  Resp: 20  20 19   Temp: (!) 100.4 F (38 C) 99 F (37.2 C)    TempSrc: Oral Oral    SpO2: 95%  95% 98%   Eyes: Anicteric no pallor. ENMT: No discharge from the ears eyes nose or mouth. Neck: No mass felt.  No JVD  appreciated. Respiratory: No rhonchi or crepitations. Cardiovascular: S1-S2 heard no murmurs appreciated. Abdomen: Soft nontender bowel sounds present. Musculoskeletal: No edema.  No joint effusion. Skin: No rash.  Skin appears warm. Neurologic: Alert awake oriented to time place and person.  Moves all extremities 5 x 5.  No facial asymmetry tongue is midline. Psychiatric: Appears normal.  Normal affect.   Labs on Admission: I have personally reviewed following labs and imaging studies  CBC: Recent Labs  Lab 11/26/17 0825 11/27/17 0054  WBC 10.5 9.6  NEUTROABS 8.4* 8.1*  HGB 15.1 13.6  HCT 44.0 38.4*  MCV 93.2 93.0  PLT 156 128*   Basic  Metabolic Panel: Recent Labs  Lab 11/26/17 0825 11/27/17 0054  NA 136 135  K 4.1 3.8  CL 99* 104  CO2 25 21*  GLUCOSE 214* 331*  BUN 16 16  CREATININE 1.12 1.06  CALCIUM 9.7 9.1   GFR: CrCl cannot be calculated (Unknown ideal weight.). Liver Function Tests: Recent Labs  Lab 11/26/17 0825 11/27/17 0054  AST 19 21  ALT 15* 17  ALKPHOS 67 66  BILITOT 1.1 0.6  PROT 7.4 6.7  ALBUMIN 3.6 3.6   No results for input(s): LIPASE, AMYLASE in the last 168 hours. No results for input(s): AMMONIA in the last 168 hours. Coagulation Profile: No results for input(s): INR, PROTIME in the last 168 hours. Cardiac Enzymes: Recent Labs  Lab 11/27/17 0054  TROPONINI <0.03   BNP (last 3 results) No results for input(s): PROBNP in the last 8760 hours. HbA1C: No results for input(s): HGBA1C in the last 72 hours. CBG: Recent Labs  Lab 11/26/17 1024  GLUCAP 187*   Lipid Profile: No results for input(s): CHOL, HDL, LDLCALC, TRIG, CHOLHDL, LDLDIRECT in the last 72 hours. Thyroid Function Tests: No results for input(s): TSH, T4TOTAL, FREET4, T3FREE, THYROIDAB in the last 72 hours. Anemia Panel: No results for input(s): VITAMINB12, FOLATE, FERRITIN, TIBC, IRON, RETICCTPCT in the last 72 hours. Urine analysis:    Component Value  Date/Time   COLORURINE YELLOW 11/27/2017 [place] 11/27/2017 0137   LABSPEC 1.016 11/27/2017 0137   PHURINE 6.0 11/27/2017 0137   GLUCOSEU >=500 (A) 11/27/2017 0137   HGBUR SMALL (A) 11/27/2017 0137   BILIRUBINUR NEGATIVE 11/27/2017 0137   KETONESUR 20 (A) 11/27/2017 0137   PROTEINUR 30 (A) 11/27/2017 0137   NITRITE NEGATIVE 11/27/2017 0137   LEUKOCYTESUR NEGATIVE 11/27/2017 0137   Sepsis Labs: @LABRCNTIP (procalcitonin:4,lacticidven:4) )No results found for this or any previous visit (from the past 240 hour(s)).   Radiological Exams on Admission: Dg Chest 2 View  Result Date: 11/26/2017 CLINICAL DATA:  Weakness and fevers EXAM: CHEST - 2 VIEW COMPARISON:  02/04/2011 FINDINGS: Cardiac shadow is within normal limits. Loop recorder is noted. Some patchy atelectatic changes are noted in the right lower lobe laterally. No sizable effusion is seen. No focal confluent infiltrate is noted. No bony abnormality is seen. IMPRESSION: Patchy changes in the right lung base as described. This may represent some early infiltrate. Electronically Signed   By: Inez Catalina M.D.   On: 11/26/2017 09:10   Ct Head Wo Contrast  Result Date: 11/27/2017 CLINICAL DATA:  Status post fall, with small skin tear at the top of the head. Altered level of consciousness and fever. EXAM: CT HEAD WITHOUT CONTRAST TECHNIQUE: Contiguous axial images were obtained from the base of the skull through the vertex without intravenous contrast. COMPARISON:  CT of the head performed 09/08/2017, and MRI of the brain performed 09/09/2017 FINDINGS: Brain: No evidence of acute infarction, hemorrhage, hydrocephalus, extra-axial collection or mass lesion / mass effect. Prominence of the ventricles and sulci reflects mild to moderate cortical volume loss. Chronic lacunar infarcts are noted at the anterior left basal ganglia. Cerebellar atrophy is noted. Scattered periventricular and subcortical white matter change likely  reflects small vessel ischemic microangiopathy. The brainstem and fourth ventricle are within normal limits. The cerebral hemispheres demonstrate grossly normal gray-white differentiation. No mass effect or midline shift is seen. Vascular: No hyperdense vessel or unexpected calcification. Skull: There is no evidence of fracture; visualized osseous structures are unremarkable in appearance. Sinuses/Orbits: Mild bilateral optic drusen are  noted. The orbits are otherwise unremarkable. The paranasal sinuses and mastoid air cells are well-aerated. Other: No significant soft tissue abnormalities are seen. IMPRESSION: 1. No evidence of traumatic intracranial injury or fracture. 2. Mild to moderate cortical volume loss and scattered small vessel ischemic microangiopathy. 3. Chronic lacunar infarcts at the anterior left basal ganglia. 4. Mild bilateral optic drusen noted. Electronically Signed   By: Garald Balding M.D.   On: 11/27/2017 02:08    EKG: Independently reviewed.  Normal sinus rhythm with new IVCD.  Assessment/Plan Principal Problem:   HCAP (healthcare-associated pneumonia) Active Problems:   PAD (peripheral artery disease) (HCC)   Aneurysm of abdominal vessel (HCC)   Hyperlipidemia   Essential hypertension   Type 2 diabetes mellitus with vascular disease (HCC)   Hypothyroidism    1. Healthcare associated pneumonia with possible developing sepsis -blood cultures have been obtained patient started on empiric antibiotics.  Follow cultures lactate levels procalcitonin levels.  If patient still spikes fever will get CAT scan of the chest and abdomen. 2. Diabetes mellitus type 2 with hyperglycemia -prior to coming to the ER patient had a pastry.  Patient usually takes metformin which should be held inpatient and keep patient on sliding scale coverage closely follow metabolic panel. 3. Hypothyroidism on Synthroid. 4. History of stroke on Plavix aspirin and statins. 5. History of peripheral vascular  disease on statins and aspirin and Plavix.  Addendum -after the admission patient started developing nausea vomiting one episode and also had some urine retention.  In and out cath was done and urine was drained.  If patient has further obstruction then will need Foley catheter.  Since patient has persistent fever and spiking 103 F CT chest and abdomen has been ordered.   DVT prophylaxis: Lovenox. Code Status: No CPR but intubation okay. Family Communication: Patient's daughter and wife. Disposition Plan: Home. Consults called: Physical therapy. Admission status: Inpatient.   Rise Patience MD [facility] Pager [phone].  If 7PM-7AM, please contact night-coverage [email or web address] Password [email or web address]  11/27/2017, 3:41 AM

## 2017-11-27 NOTE — Progress Notes (Signed)
Inpatient Diabetes Program Recommendations  AACE/ADA: New Consensus Statement on Inpatient Glycemic Control (2015)  Target Ranges:  Prepandial:   less than 140 mg/dL      Peak postprandial:   less than 180 mg/dL (1-2 hours)      Critically ill patients:  140 - 180 mg/dL   Lab Results  Component Value Date   GLUCAP 225 (H) 11/27/2017   HGBA1C 7.8 (H) 09/10/2017    Review of Glycemic Control  Diabetes history: DM2 Outpatient Diabetes medications: metformin 1000 mg bid Current orders for Inpatient glycemic control: Novolog 0-9 units tidwc  CO2 - 18. Blood sugars in 200s all day.  Inpatient Diabetes Program Recommendations:    Increase Novolog to 0-15 units Q4H. May need low dose basal insulin if FBS > 180 mg/dL. Would recommend Lantus 8 units Q24H.  Will follow.   Thank you. Lorenda Peck, RD, LDN, CDE Inpatient Diabetes Coordinator [phone]

## 2017-11-27 NOTE — ED Provider Notes (Signed)
Patient presented to the ER with fever of 103, confusion, weakness  Face to face Exam: HEENT - PERRLA Lungs -no respiratory distress, diminished right base Heart - RRR, no M/R/G Abd - S/NT/ND Neuro - alert, oriented x3  Plan: Patient with known pneumonia, worsening fever and weakness, will require hospitalization.   Orpah Greek, MD 11/27/17 [phone]

## 2017-11-27 NOTE — Progress Notes (Signed)
Pharmacy Antibiotic Note  [name] [name] is a 84 y.o. male admitted on 11/26/2017 with pneumonia.  Pharmacy has been consulted for vancomycin dosing.  Plan: Vancomycin 1250 mg x1 then 750 mg IV q24h for est AUC = 508 Goal AUC = 400-500 F/u scr/cultures/levels Cefepime 1 Gm IV q12h (rx adjusted for CrCl~42 ml/min)     Temp (24hrs), Avg:99.3 F (37.4 C), Min:98.2 F (36.8 C), Max:100.4 F (38 C)  Recent Labs  Lab 11/26/17 0825 11/27/17 0054  WBC 10.5 9.6  CREATININE 1.12 1.06  LATICACIDVEN  --  1.6    CrCl cannot be calculated (Unknown ideal weight.).    Allergies  Allergen Reactions  . Actos [Pioglitazone] Swelling  . Allegra [Fexofenadine] Other (See Comments)    stimulant  . Iodine Swelling and Other (See Comments)    DEADLY SICK  . Metformin And Related Diarrhea    Pt states that he has a reaction to the extended release metformin but takes the regular  . Onglyza [Saxagliptin] Other (See Comments)    Weight loss  . Shellfish Allergy     DEADLY SICK    Antimicrobials this admission: 5/30 cefepime >>  5/30 vancomycin >>   Dose adjustments this admission:   Microbiology results:  BCx:   UCx:    Sputum:    MRSA PCR:  Thank you for allowing pharmacy to be a part of this patient's care.  Dorrene German 11/27/2017 3:43 AM

## 2017-11-27 NOTE — Progress Notes (Signed)
A consult was received from an ED physician for vancomycin per pharmacy dosing.  The patient's profile has been reviewed for ht/wt/allergies/indication/available labs.   A one time order has been placed for Vancomycin 1250 mg.  Further antibiotics/pharmacy consults should be ordered by admitting physician if indicated.                       Thank you, Dorrene German 11/27/2017  12:13 AM

## 2017-11-27 NOTE — ED Notes (Signed)
ED TO INPATIENT HANDOFF REPORT  Name/Age/Gender [name] [name] 84 y.o. male  Code Status    Code Status Orders  (From admission, onward)        Start     Ordered   11/27/17 0337  Limited resuscitation (code)  Continuous    Question Answer Comment  In the event of cardiac or respiratory ARREST: Initiate Code Blue, Call Rapid Response Yes   In the event of cardiac or respiratory ARREST: Perform CPR No   In the event of cardiac or respiratory ARREST: Perform Intubation/Mechanical Ventilation Yes   In the event of cardiac or respiratory ARREST: Use NIPPV/BiPAp only if indicated No   In the event of cardiac or respiratory ARREST: Administer ACLS medications if indicated No   In the event of cardiac or respiratory ARREST: Perform Defibrillation or Cardioversion if indicated No      11/27/17 0340    Code Status History    Date Active Date Inactive Code Status Order ID Comments User Context   09/09/2017 0606 09/10/2017 2106 Full Code [id number]  Vianne Bulls, MD ED   01/16/2017 1912 01/17/2017 2107 Full Code [id number]  Biddeford Bing, DO ED    Advance Directive Documentation     Most Recent Value  Type of Advance Directive  Healthcare Power of [place], Living will  Pre-existing out of facility DNR order (yellow form or pink MOST form)  -  "MOST" Form in Place?  -      Home/SNF/Other Home  Chief Complaint Fever  Level of Care/Admitting Diagnosis ED Disposition    ED Disposition Condition [place]: [facility] [100102]  Level of Care: Telemetry [5]  Admit to tele based on following criteria: Monitor for Ischemic changes  Diagnosis: HCAP (healthcare-associated pneumonia) [428768]  Admitting Physician: Rise Patience [phone]  Attending Physician: Rise Patience [phone]  Estimated length of stay: past midnight tomorrow  Certification:: I certify this patient will need inpatient services for at least 2 midnights  PT  Class (Do Not Modify): Inpatient [101]  PT Acc Code (Do Not Modify): Private [1]       Medical History Past Medical History:  Diagnosis Date  . AAA (abdominal aortic aneurysm) ([place])   . 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 Diabetes mellitus without complication ([place])   . Hiatal hernia   . HOH (hard of hearing)   . Hyperlipidemia   . Stroke [facility])     Allergies Allergies  Allergen Reactions  . Actos [Pioglitazone] Swelling  . Allegra [Fexofenadine] Other (See Comments)    stimulant  . Iodine Swelling and Other (See Comments)    DEADLY SICK  . Metformin And Related Diarrhea    Pt states that he has a reaction to the extended release metformin but takes the regular  . Onglyza [Saxagliptin] Other (See Comments)    Weight loss  . Shellfish Allergy     DEADLY SICK    IV Location/Drains/Wounds Patient Lines/Drains/Airways Status   Active Line/Drains/Airways    Name:   Placement date:   Placement time:   Site:   Days:   Peripheral IV 11/27/17 Left Hand   11/27/17    0052    Hand   less than 1   Peripheral IV 11/27/17 Left Forearm   11/27/17    0052    Forearm   less than 1   Urethral Catheter Avie Echevaria, RN [place] 16 Fr.   11/27/17  8875    [place]   less than 1          Labs/Imaging Results for orders placed or performed during the hospital encounter of 11/26/17 (from the past 48 hour(s))  Lactic acid, plasma     Status: None   Collection Time: 11/27/17 12:54 AM  Result Value Ref Range   Lactic Acid, Venous 1.6 0.5 - 1.9 mmol/L    Comment: Performed at [facility], [place] [address] [place], [place] [address]  CBC with Differential     Status: Abnormal   Collection Time: 11/27/17 12:54 AM  Result Value Ref Range   WBC 9.6 4.0 - 10.5 K/uL   RBC 4.13 (L) 4.22 - 5.81 MIL/uL   Hemoglobin 13.6 13.0 - 17.0 g/dL   HCT 38.4 (L) 39.0 - 52.0 %   MCV 93.0 78.0 - 100.0 fL   MCH 32.9 26.0 - 34.0 pg   MCHC 35.4 30.0 - 36.0 g/dL   RDW 12.5 11.5 - 15.5 %    Platelets 128 (L) 150 - 400 K/uL   Neutrophils Relative % 85 %   Neutro Abs 8.1 (H) 1.7 - 7.7 K/uL   Lymphocytes Relative 5 %   Lymphs Abs 0.5 (L) 0.7 - 4.0 K/uL   Monocytes Relative 10 %   Monocytes Absolute 1.0 0.1 - 1.0 K/uL   Eosinophils Relative 0 %   Eosinophils Absolute 0.0 0.0 - 0.7 K/uL   Basophils Relative 0 %   Basophils Absolute 0.0 0.0 - 0.1 K/uL    Comment: Performed at [facility], [place] [address], [place], [place] [address]  Comprehensive metabolic panel     Status: Abnormal   Collection Time: 11/27/17 12:54 AM  Result Value Ref Range   Sodium 135 135 - 145 mmol/L   Potassium 3.8 3.5 - 5.1 mmol/L   Chloride 104 101 - 111 mmol/L   CO2 21 (L) 22 - 32 mmol/L   Glucose, Bld 331 (H) 65 - 99 mg/dL   BUN 16 6 - 20 mg/dL   Creatinine, Ser 1.06 0.61 - 1.24 mg/dL   Calcium 9.1 8.9 - 10.3 mg/dL   Total Protein 6.7 6.5 - 8.1 g/dL   Albumin 3.6 3.5 - 5.0 g/dL   AST 21 15 - 41 U/L   ALT 17 17 - 63 U/L   Alkaline Phosphatase 66 38 - 126 U/L   Total Bilirubin 0.6 0.3 - 1.2 mg/dL   GFR calc non Af Amer >60 >60 mL/min   GFR calc Af Amer >60 >60 mL/min    Comment: (NOTE) The eGFR has been calculated using the CKD EPI equation. This calculation has not been validated in all clinical situations. eGFR's persistently <60 mL/min signify possible Chronic Kidney Disease.    Anion gap 10 5 - 15    Comment: Performed at [facility], [place] [address] [place], [place] [address]  Troponin I     Status: None   Collection Time: 11/27/17 12:54 AM  Result Value Ref Range   Troponin I <0.03 <0.03 ng/mL    Comment: Performed at [facility], [place] [address] [place], [place] [address]  Urinalysis, Routine w reflex microscopic     Status: Abnormal   Collection Time: 11/27/17  1:37 AM  Result Value Ref Range   Color, Urine YELLOW YELLOW   APPearance CLEAR CLEAR   Specific Gravity, Urine 1.016 1.005 - 1.030   pH 6.0 5.0 - 8.0    Glucose,  UA >=500 (A) NEGATIVE mg/dL   Hgb urine dipstick SMALL (A) NEGATIVE   Bilirubin Urine NEGATIVE NEGATIVE   Ketones, ur 20 (A) NEGATIVE mg/dL   Protein, ur 30 (A) NEGATIVE mg/dL   Nitrite NEGATIVE NEGATIVE   Leukocytes, UA NEGATIVE NEGATIVE   RBC / HPF 0-5 0 - 5 RBC/hpf   WBC, UA 0-5 0 - 5 WBC/hpf   Bacteria, UA NONE SEEN NONE SEEN   Mucus PRESENT     Comment: Performed at [facility], [place] [address], [place], [place] [address]  Strep pneumoniae urinary antigen     Status: None   Collection Time: 11/27/17  1:37 AM  Result Value Ref Range   Strep Pneumo Urinary Antigen NEGATIVE NEGATIVE    Comment:        Infection due to S. pneumoniae cannot be absolutely ruled out since the antigen present may be below the detection limit of the test.   Hepatic function panel     Status: Abnormal   Collection Time: 11/27/17  6:30 AM  Result Value Ref Range   Total Protein 6.5 6.5 - 8.1 g/dL   Albumin 3.3 (L) 3.5 - 5.0 g/dL   AST 25 15 - 41 U/L   ALT 18 17 - 63 U/L   Alkaline Phosphatase 63 38 - 126 U/L   Total Bilirubin 1.1 0.3 - 1.2 mg/dL   Bilirubin, Direct 0.2 0.1 - 0.5 mg/dL   Indirect Bilirubin 0.9 0.3 - 0.9 mg/dL    Comment: Performed at [facility], [place] [address], [place], [place] [address]  Troponin I     Status: None   Collection Time: 11/27/17  6:30 AM  Result Value Ref Range   Troponin I <0.03 <0.03 ng/mL    Comment: Performed at [facility], [place] [address] [place], [place] [address]  Lipase, blood     Status: None   Collection Time: 11/27/17  6:30 AM  Result Value Ref Range   Lipase 20 11 - 51 U/L    Comment: Performed at [facility], [place] [address] [place], [place] [address]  Basic metabolic panel     Status: Abnormal   Collection Time: 11/27/17  6:30 AM  Result Value Ref Range   Sodium 136 135 - 145 mmol/L   Potassium 3.7 3.5 - 5.1 mmol/L   Chloride 106 101 - 111 mmol/L   CO2 18  (L) 22 - 32 mmol/L   Glucose, Bld 255 (H) 65 - 99 mg/dL   BUN 13 6 - 20 mg/dL   Creatinine, Ser 0.93 0.61 - 1.24 mg/dL   Calcium 8.7 (L) 8.9 - 10.3 mg/dL   GFR calc non Af Amer >60 >60 mL/min   GFR calc Af Amer >60 >60 mL/min    Comment: (NOTE) The eGFR has been calculated using the CKD EPI equation. This calculation has not been validated in all clinical situations. eGFR's persistently <60 mL/min signify possible Chronic Kidney Disease.    Anion gap 12 5 - 15    Comment: Performed at [facility], [place] [address] [place], [place] [address]  CBC with Differential/Platelet     Status: Abnormal   Collection Time: 11/27/17  6:30 AM  Result Value Ref Range   WBC 8.9 4.0 - 10.5 K/uL   RBC 4.24 4.22 - 5.81 MIL/uL   Hemoglobin 14.1 13.0 - 17.0 g/dL   HCT 39.5 39.0 - 52.0 %   MCV 93.2 78.0 - 100.0 fL  MCH 33.3 26.0 - 34.0 pg   MCHC 35.7 30.0 - 36.0 g/dL   RDW 12.6 11.5 - 15.5 %   Platelets 127 (L) 150 - 400 K/uL   Neutrophils Relative % 84 %   Neutro Abs 7.4 1.7 - 7.7 K/uL   Lymphocytes Relative 5 %   Lymphs Abs 0.4 (L) 0.7 - 4.0 K/uL   Monocytes Relative 11 %   Monocytes Absolute 1.0 0.1 - 1.0 K/uL   Eosinophils Relative 0 %   Eosinophils Absolute 0.0 0.0 - 0.7 K/uL   Basophils Relative 0 %   Basophils Absolute 0.0 0.0 - 0.1 K/uL    Comment: Performed at [facility], [place] [address], [place], [place] [address]  Lactic acid, plasma     Status: None   Collection Time: 11/27/17  6:30 AM  Result Value Ref Range   Lactic Acid, Venous 1.2 0.5 - 1.9 mmol/L    Comment: Performed at [facility], [place] [address], [place], [place] [address]  CBG monitoring, ED     Status: Abnormal   Collection Time: 11/27/17  8:00 AM  Result Value Ref Range   Glucose-Capillary 243 (H) 65 - 99 mg/dL  CBG monitoring, ED     Status: Abnormal   Collection Time: 11/27/17  1:00 PM  Result Value Ref Range   Glucose-Capillary 225 (H) 65 - 99 mg/dL    Dg Chest 2 View  Result Date: 11/26/2017 CLINICAL DATA:  Weakness and fevers EXAM: CHEST - 2 VIEW COMPARISON:  02/04/2011 FINDINGS: Cardiac shadow is within normal limits. Loop recorder is noted. Some patchy atelectatic changes are noted in the right lower lobe laterally. No sizable effusion is seen. No focal confluent infiltrate is noted. No bony abnormality is seen. IMPRESSION: Patchy changes in the right lung base as described. This may represent some early infiltrate. Electronically Signed   By: Inez Catalina M.D.   On: 11/26/2017 09:10   Ct Head Wo Contrast  Result Date: 11/27/2017 CLINICAL DATA:  Status post fall, with small skin tear at the top of the head. Altered level of consciousness and fever. EXAM: CT HEAD WITHOUT CONTRAST TECHNIQUE: Contiguous axial images were obtained from the base of the skull through the vertex without intravenous contrast. COMPARISON:  CT of the head performed 09/08/2017, and MRI of the brain performed 09/09/2017 FINDINGS: Brain: No evidence of acute infarction, hemorrhage, hydrocephalus, extra-axial collection or mass lesion / mass effect. Prominence of the ventricles and sulci reflects mild to moderate cortical volume loss. Chronic lacunar infarcts are noted at the anterior left basal ganglia. Cerebellar atrophy is noted. Scattered periventricular and subcortical white matter change likely reflects small vessel ischemic microangiopathy. The brainstem and fourth ventricle are within normal limits. The cerebral hemispheres demonstrate grossly normal gray-white differentiation. No mass effect or midline shift is seen. Vascular: No hyperdense vessel or unexpected calcification. Skull: There is no evidence of fracture; visualized osseous structures are unremarkable in appearance. Sinuses/Orbits: Mild bilateral optic drusen are noted. The orbits are otherwise unremarkable. The paranasal sinuses and mastoid air cells are well-aerated. Other: No significant soft tissue  abnormalities are seen. IMPRESSION: 1. No evidence of traumatic intracranial injury or fracture. 2. Mild to moderate cortical volume loss and scattered small vessel ischemic microangiopathy. 3. Chronic lacunar infarcts at the anterior left basal ganglia. 4. Mild bilateral optic drusen noted. Electronically Signed   By: Garald Balding M.D.   On: 11/27/2017 02:08   Dg Esophagus  Result Date: 11/27/2017 CLINICAL DATA:  Vomiting.  Hiatal hernia. EXAM: ESOPHOGRAM/BARIUM SWALLOW TECHNIQUE: Single contrast examination was performed using  thin barium. FLUOROSCOPY TIME:  Fluoroscopy Time:  1 minutes 12 seconds Radiation Exposure Index (if provided by the fluoroscopic device): 8.1 mGy Number of Acquired Spot Images: 0 COMPARISON:  None. FINDINGS: The patient was not able to stand or sit up well. We performed the exam with the patient in the left posterior oblique position. There are occasional secondary and tertiary spasms in the esophagus. Primary peristaltic waves were intact on 3/4 swallows, within normal limits. Mild distal esophageal fold thickening is observed. There is a distal esophageal mucosal ring (Schatzki's ring) which I was not able to distend beyond about 11 mm in diameter using the liquids. However, a 13 mm barium tablet passed without difficulty into the stomach and beyond this Schatzki's ring, and accordingly the radius at least 13 mm in diameter when challenged with a solid. This is of a size to potentially cause symptoms. Loop recorder noted. The patient did have some coughing after drinking water to help swallow the pill, but no aspiration was observed with barium. IMPRESSION: 1. Distal esophageal mucosal ring allowed a 13 mm barium tablet did pass and thus is at least 13 mm in diameter. However, using fluids I was only able to distend this ring up to about 11 mm in diameter. This ring is of a size to potentially cause food distended. 2. Mild distal esophageal fold thickening, this can be seen in the  setting of esophagitis. No ulcer identified. Primary peristaltic waves were preserved. Electronically Signed   By: Van Clines M.D.   On: 11/27/2017 14:23    Pending Labs Unresulted Labs (From admission, onward)   Start     Ordered   12/04/17 0500  Creatinine, serum  (enoxaparin (LOVENOX)    CrCl >/= 30 ml/min)  Weekly,   R    Comments:  while on enoxaparin therapy    11/27/17 0340   11/27/17 0840  MRSA PCR Screening  STAT,   R     11/27/17 0839   11/27/17 0518  Lactic acid, plasma  STAT Now then every 3 hours,   R     11/27/17 0517   11/27/17 0336  CBC  (enoxaparin (LOVENOX)    CrCl >/= 30 ml/min)  Once,   R    Comments:  Baseline for enoxaparin therapy IF NOT ALREADY DRAWN.  Notify MD if PLT < 100 K.    11/27/17 0340   11/27/17 0336  Creatinine, serum  (enoxaparin (LOVENOX)    CrCl >/= 30 ml/min)  Once,   R    Comments:  Baseline for enoxaparin therapy IF NOT ALREADY DRAWN.    11/27/17 0340   11/27/17 0334  Culture, sputum-assessment  Once,   R     11/27/17 0340   11/27/17 0334  Gram stain  Once,   R     11/27/17 0340   11/27/17 0334  Legionella Pneumophila Serogp 1 Ur Ag  Once,   R     11/27/17 0340   11/27/17 0023  Culture, blood (routine x 2)  BLOOD CULTURE X 2,   STAT     11/27/17 0024   11/27/17 0017  Urine culture  STAT,   STAT     11/27/17 0016      Vitals/Pain Today's Vitals   11/27/17 1230 11/27/17 1300 11/27/17 1330 11/27/17 1422  BP: (!) 169/77 (!) 157/75 (!) 160/90 (!) 160/90  Pulse: 95 93 92 92  Resp: (!)  22 (!) 22 (!) 22 (!) 22  Temp:      TempSrc:      SpO2: 98% 97% 98% 98%  Weight:        Isolation Precautions No active isolations  Medications Medications  clopidogrel (PLAVIX) tablet 75 mg (75 mg Oral Given 11/27/17 0954)  diazepam (VALIUM) tablet 2.5-5 mg (has no administration in time range)  levothyroxine (SYNTHROID, LEVOTHROID) tablet 75 mcg (75 mcg Oral Given 11/27/17 0954)  pantoprazole (PROTONIX) EC tablet 40 mg (has no  administration in time range)  rosuvastatin (CRESTOR) tablet 10 mg (has no administration in time range)  vitamin B-12 (CYANOCOBALAMIN) tablet 1,000 mcg (1,000 mcg Oral Given 11/27/17 0954)  acetaminophen (TYLENOL) tablet 650 mg (has no administration in time range)    Or  acetaminophen (TYLENOL) suppository 650 mg (has no administration in time range)  ondansetron (ZOFRAN) tablet 4 mg (has no administration in time range)    Or  ondansetron (ZOFRAN) injection 4 mg (has no administration in time range)  ceFEPIme (MAXIPIME) 1 g in sodium chloride 0.9 % 100 mL IVPB (has no administration in time range)  insulin aspart (novoLOG) injection 0-9 Units (3 Units Subcutaneous Given 11/27/17 1317)  enoxaparin (LOVENOX) injection 40 mg (has no administration in time range)  vancomycin (VANCOCIN) IVPB 750 mg/150 ml premix (has no administration in time range)  0.9 %  sodium chloride infusion ( Intravenous New Bag/Given 11/27/17 1018)  sodium chloride 0.9 % bolus 500 mL (0 mLs Intravenous Stopped 11/27/17 0155)  0.9 %  sodium chloride infusion ( Intravenous Stopped 11/27/17 0348)  ceFEPIme (MAXIPIME) 1 g in sodium chloride 0.9 % 100 mL IVPB (0 g Intravenous Stopped 11/27/17 0136)  vancomycin (VANCOCIN) 1,250 mg in sodium chloride 0.9 % 250 mL IVPB (0 mg Intravenous Stopped 11/27/17 0313)  lidocaine (XYLOCAINE) 2 % jelly 1 application (1 application Urethral Given 11/27/17 0605)  iohexol (OMNIPAQUE) 300 MG/ML solution 15 mL (15 mLs Oral Contrast Given 11/27/17 0611)    Mobility walks

## 2017-11-27 NOTE — ED Notes (Signed)
Pt given urinal to provide a urine sample.

## 2017-11-27 NOTE — ED Notes (Signed)
Foley cath inserted with out difficulty  Drained 1081ml immediately  Catheter clamped

## 2017-11-27 NOTE — [place] Notes (Signed)
[facility]  84 y/o with CVA, DM2, AAA, HLD, HTN, Hypothyroid presents for fever 102 via EMS and 103 in ED. Seen in Ed on 5/29 and found to have pneumonia and discharged with Augmentin and doxycycline.  Also had dyspnea, cough, dark urine, increased urination, elevated CBGs. CXR> RLL infiltrate U retention- 1000 cc after foley placed  He was admitted from 3/12 through 3/13 for a CVA. He has a loop recorder.   Esophagram: Distal esophageal mucosal ring allowed a 13 mm barium tablet did pass and thus is at least 13 mm in diameter. However, using fluids I was only able to distend this ring up to about 11 mm in diameter. This ring is of a size to potentially cause food distended.  Principal Problem:   HCAP - RLL infiltrate Regurgitation/ vomiting episodes Esophageal narrowing  - possibly aspiration based on above esophagram- cont Cefepime- stop Vanc - strep pneumo neg - patients wife states he has a hiatal hernia but is not sure how it was diagnosed or who told them this- he has never had an EGD but has had a colonoscopy - - will start PPI in case this is esophagitis- will need GI f/u - they will likely not scope in setting of acute pneumonia and so I have not called them today  Active Problems:  Urinary retention - 1 L removed after foley placed- he was voiding at home- -  start Flomax  H/o CVA  /   Hyperlipidemia - 09/09/17- trouble with word finding - cont statin-aspirin and plavix - has loop recorder    Aneurysm of abdominal vessel  S/p repair   Type 2 diabetes mellitus with vascular disease ([place]) - Metformin on hold- SSI    Hypothyroidism - synthroid    Debbe Odea, MD

## 2017-11-27 NOTE — ED Provider Notes (Signed)
[facility] DEPT Provider Note   CSN: [id number] Arrival date & time: 11/26/17  2258     History   Chief Complaint Chief Complaint  Patient presents with  . Fever    HPI [name] [name] is a 84 y.o. male.  Patient returns to the ED for management of fever, increasing weakness, falls, confusion after being seen less than 15 hours ago and diagnosed with pneumonia. He presented earlier today with symptoms of weakness and increasing number of fall over the last 2 days, diagnosed with CAP and was discharged home on Doxycycline and Augmentin. He has had a total of 2 doses of each. Throughout today the family has noticed a rapid decline in his appetite, increased weakness, confusion and fever that increased to Tmax 103 at home. No vomiting, SOB, chest pain or cough.   The history is provided by the patient, the spouse and a relative. No language interpreter was used.  Fever   Pertinent negatives include no chest pain, no vomiting, no congestion, no sore throat and no cough.    Past Medical History:  Diagnosis Date  . AAA (abdominal aortic aneurysm) ([place])   . 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 Diabetes mellitus without complication ([place])   . Hiatal hernia   . HOH (hard of hearing)   . Hyperlipidemia   . Stroke [facility])     Patient Active Problem List   Diagnosis Date Noted  . Cerebrovascular accident (CVA) ([place]) 09/09/2017  . Word finding difficulty   . Cerebral thrombosis with cerebral infarction 01/17/2017  . Aphasia   . Hyperlipidemia   . Essential hypertension   . Expressive aphasia 01/16/2017  . Diabetes mellitus ([place]) 01/16/2017  . 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 Aftercare following surgery of the circulatory system, [place] 12/09/2013  . Abdominal aneurysm without mention of rupture 12/09/2013  . PAD (peripheral artery disease) ([place]) 04/14/2013    Past Surgical History:  Procedure Laterality Date  . ABDOMINAL AORTIC ANEURYSM  REPAIR  10-2003   also had iliac aneurysm repair  . CHOLECYSTECTOMY     Laparoscopic cholecystectomy  . EYE SURGERY Bilateral    cataracts  . HERNIA REPAIR    . LOOP RECORDER INSERTION N/A 09/10/2017   Procedure: LOOP RECORDER INSERTION;  Surgeon: Deboraha Sprang, MD;  Location: [facility] CV LAB;  Service: Cardiovascular;  Laterality: N/A;  . TEE WITHOUT CARDIOVERSION N/A 09/10/2017   Procedure: TRANSESOPHAGEAL ECHOCARDIOGRAM (TEE);  Surgeon: Jerline Pain, MD;  Location: [facility] ENDOSCOPY;  Service: Cardiovascular;  Laterality: N/A;        Home Medications    Prior to Admission medications   Medication Sig Start Date End Date Taking? Authorizing Provider  amoxicillin-clavulanate (AUGMENTIN) 875-125 MG tablet Take 1 tablet by mouth 2 (two) times daily. One po bid x 7 days 11/26/17  Yes Mesner, Corene Cornea, MD  cholecalciferol (VITAMIN D) 1000 units tablet Take 1,000 Units by mouth daily with supper.   Yes [provider]  clopidogrel (PLAVIX) 75 MG tablet Take 1 tablet (75 mg total) by mouth daily. 09/11/17  Yes Rama, Venetia Maxon, MD  Coenzyme Q10 (COQ10) 100 MG CAPS Take 100 mg by mouth daily.   Yes [provider]  diazepam (VALIUM) 5 MG tablet Take 2.5-5 mg by mouth at bedtime as needed for anxiety (restless legs).   Yes [provider]  doxycycline (VIBRAMYCIN) 100 MG capsule Take 1 capsule (100 mg total) by mouth 2 (two) times daily. One po bid  x 7 days 11/26/17  Yes Mesner, Corene Cornea, MD  levothyroxine (SYNTHROID, LEVOTHROID) 75 MCG tablet Take 75 mcg by mouth daily before breakfast.  09/15/13  Yes [provider]  loratadine (CLARITIN) 10 MG tablet Take 10 mg by mouth daily.    Yes [provider]  metFORMIN (GLUCOPHAGE) 500 MG tablet Take 1,000 mg by mouth 2 (two) times daily. 11/09/17  Yes [provider]  Multiple Vitamin (MULTIVITAMIN WITH MINERALS) TABS tablet Take 1 tablet by mouth daily.   Yes [provider]  Omega-3 1000 MG CAPS  Take 1,000 mg by mouth daily.   Yes [provider]  omeprazole (PRILOSEC) 20 MG capsule Take 20 mg by mouth at bedtime.    Yes [provider]  OVER THE COUNTER MEDICATION Take 2 tablets by mouth daily. OTC Bee Caps Dietary Supplement   Yes [provider]  rosuvastatin (CRESTOR) 10 MG tablet Take 10 mg by mouth at bedtime.    Yes [provider]  sildenafil (REVATIO) 20 MG tablet Take 40-100 mg by mouth as needed (erectile dysfunction). For sexual activity 10/31/16  Yes [provider]  vitamin B-12 (CYANOCOBALAMIN) 1000 MCG tablet Take 1,000 mcg by mouth daily.   Yes [provider]  aspirin 325 MG tablet Take 1 tablet (325 mg total) by mouth daily. Stop in 3 weeks and then just take Plavix alone. Patient not taking: Reported on 11/26/2017 09/10/17   Rama, Venetia Maxon, MD    Family History Family History  Problem Relation Age of Onset  . Arthritis Mother        Rheumatoid arthritis    Social History Social History   Tobacco Use  . Smoking status: Never Smoker  . Smokeless tobacco: Never Used  Substance Use Topics  . Alcohol use: No  . Drug use: No     Allergies   Actos [pioglitazone]; Allegra [fexofenadine]; Iodine; Metformin and related; Onglyza [saxagliptin]; and Shellfish allergy   Review of Systems Review of Systems  Constitutional: Positive for activity change, appetite change and fever.  HENT: Negative.  Negative for congestion and sore throat.   Respiratory: Negative.  Negative for cough and shortness of breath.   Cardiovascular: Negative.  Negative for chest pain.  Gastrointestinal: Negative.  Negative for abdominal pain, nausea and vomiting.  Skin: Negative for pallor and rash.  Neurological: Positive for weakness.  Psychiatric/Behavioral: Positive for confusion.     Physical Exam Updated Vital Signs BP (!) 160/79 (BP Location: Left Arm)   Pulse (!) 105   Temp 99 F (37.2 C) (Oral)   Resp 20   SpO2 95%     Physical Exam  Constitutional: He is oriented to person, place, and time. He appears well-developed and well-nourished. No distress.  Eyes: Conjunctivae are normal.  Neck: Normal range of motion. Neck supple.  Cardiovascular: Regular rhythm. Tachycardia present.  Pulmonary/Chest: Effort normal.  Poor effort  Abdominal: Soft. He exhibits no distension. There is no tenderness.  Musculoskeletal: Normal range of motion.  Neurological: He is alert and oriented to person, place, and time. No sensory deficit. He exhibits normal muscle tone.  No lateralizing weakness  Skin: Skin is warm and dry.     ED Treatments / Results  Labs (all labs ordered are listed, but only abnormal results are displayed) Labs Reviewed  LACTIC ACID, PLASMA  LACTIC ACID, PLASMA    EKG None  Radiology Dg Chest 2 View  Result Date: 11/26/2017 CLINICAL DATA:  Weakness and fevers EXAM: CHEST -  2 VIEW COMPARISON:  02/04/2011 FINDINGS: Cardiac shadow is within normal limits. Loop recorder is noted. Some patchy atelectatic changes are noted in the right lower lobe laterally. No sizable effusion is seen. No focal confluent infiltrate is noted. No bony abnormality is seen. IMPRESSION: Patchy changes in the right lung base as described. This may represent some early infiltrate. Electronically Signed   By: Inez Catalina M.D.   On: 11/26/2017 09:10    Procedures Procedures (including critical care time)  Medications Ordered in ED Medications  sodium chloride 0.9 % bolus 500 mL (has no administration in time range)  0.9 %  sodium chloride infusion (has no administration in time range)  ceFEPIme (MAXIPIME) 1 g in sodium chloride 0.9 % 100 mL IVPB (has no administration in time range)     Initial Impression / Assessment and Plan / ED Course  I have reviewed the triage vital signs and the nursing notes.  Pertinent labs & imaging results that were available during my care of the patient were reviewed by me and  considered in my medical decision making (see chart for details).     Patient presents for second time in 15 hours with diagnosis of pneumonia, discharged earlier on Doxycycline and Augmentin. Since discharge family has seen his condition decline to include increased weakness, no PO intake including food or fluids, intermittent confusion and increased fever with Tmax 103.   Discussed with Dr. Hal Hope, [facility], who requests repeat labs, head CT. He will accept the patient onto his service. Appreciate his help with this patient.   Final Clinical Impressions(s) / ED Diagnoses   Final diagnoses:  None   1. HCAP  ED Discharge Orders    None       Charlann Lange, PA-C 11/27/17 0026    Orpah Greek, MD 11/27/17 [phone]

## 2017-11-28 DIAGNOSIS — I10 Essential (primary) hypertension: Secondary | ICD-10-CM

## 2017-11-28 DIAGNOSIS — E785 Hyperlipidemia, unspecified: Secondary | ICD-10-CM

## 2017-11-28 LAB — BASIC METABOLIC PANEL
Anion gap: 9 (ref 5–15)
BUN: 13 mg/dL (ref 6–20)
CO2: 21 mmol/L — ABNORMAL LOW (ref 22–32)
Calcium: 8.7 mg/dL — ABNORMAL LOW (ref 8.9–10.3)
Chloride: 108 mmol/L (ref 101–111)
Creatinine, Ser: 0.98 mg/dL (ref 0.61–1.24)
GFR calc Af Amer: >60 mL/min (ref >60)
GFR calc non Af Amer: >60 mL/min (ref >60)
Glucose, Bld: 244 mg/dL — ABNORMAL HIGH (ref 65–99)
Potassium: 3.4 mmol/L — ABNORMAL LOW (ref 3.5–5.1)
Sodium: 138 mmol/L (ref 135–145)

## 2017-11-28 LAB — GLUCOSE, CAPILLARY
Glucose-Capillary: 214 mg/dL — ABNORMAL HIGH (ref 65–99)
Glucose-Capillary: 222 mg/dL — ABNORMAL HIGH (ref 65–99)
Glucose-Capillary: 222 mg/dL — ABNORMAL HIGH (ref 65–99)
Glucose-Capillary: 356 mg/dL — ABNORMAL HIGH (ref 65–99)

## 2017-11-28 LAB — CBC
HCT: 36.4 % — ABNORMAL LOW (ref 39.0–52.0)
Hemoglobin: 12.9 g/dL — ABNORMAL LOW (ref 13.0–17.0)
MCH: 32.7 pg (ref 26.0–34.0)
MCHC: 35.4 g/dL (ref 30.0–36.0)
MCV: 92.2 fL (ref 78.0–100.0)
Platelets: 143 10*3/uL — ABNORMAL LOW (ref 150–400)
RBC: 3.95 MIL/uL — ABNORMAL LOW (ref 4.22–5.81)
RDW: 12.4 % (ref 11.5–15.5)
WBC: 5.6 10*3/uL (ref 4.0–10.5)

## 2017-11-28 LAB — RESPIRATORY PANEL BY PCR

## 2017-11-28 LAB — LEGIONELLA PNEUMOPHILA SEROGP 1 UR AG: L. pneumophila Serogp 1 Ur Ag: NEGATIVE

## 2017-11-28 LAB — MAGNESIUM: Magnesium: 1.4 mg/dL — ABNORMAL LOW (ref 1.7–2.4)

## 2017-11-28 LAB — URINE CULTURE: Culture: <10000 — AB

## 2017-11-28 MED ORDER — SODIUM CHLORIDE 0.9 % IV BOLUS
500.0000 mL | Freq: Once | INTRAVENOUS | Status: AC
  Administered 2017-11-28: 500 mL via INTRAVENOUS

## 2017-11-28 MED ORDER — PIPERACILLIN-TAZOBACTAM 3.375 G IVPB
3.3750 g | Freq: Three times a day (TID) | INTRAVENOUS | Status: DC
  Administered 2017-11-28 – 2017-11-30 (×7): 3.375 g via INTRAVENOUS
  Filled 2017-11-28 (×8): qty 50

## 2017-11-28 MED ORDER — DOXAZOSIN MESYLATE 1 MG PO TABS
1.0000 mg | ORAL_TABLET | Freq: Every day | ORAL | Status: DC
  Administered 2017-11-28 – 2017-11-30 (×3): 1 mg via ORAL
  Filled 2017-11-28 (×3): qty 1

## 2017-11-28 MED ORDER — POTASSIUM CHLORIDE 20 MEQ/15ML (10%) PO SOLN
40.0000 meq | ORAL | Status: AC
  Administered 2017-11-28 (×2): 40 meq via ORAL
  Filled 2017-11-28 (×2): qty 30

## 2017-11-28 MED ORDER — POTASSIUM CHLORIDE CRYS ER 10 MEQ PO TBCR: 40.0000 meq | EXTENDED_RELEASE_TABLET | ORAL | Status: DC

## 2017-11-28 NOTE — Progress Notes (Addendum)
Pharmacy Antibiotic Note  [name] [name] is a 84 y.o. male admitted on 11/26/2017 with pneumonia.  To change abx's from cefepime to Zosyn for aspiration PNA on 5/31  Plan: Zosyn 3.375g IV q8 (extended interval infusion) Will sign off Would recommend narrowing Zosyn even further to Unasyn for aspiration PNA as pseudomonas is not likely with aspiration   Weight: 125 lb (56.7 kg)  Temp (24hrs), Avg:99.6 F (37.6 C), Min:98 F (36.7 C), Max:102.3 F (39.1 C)  Recent Labs  Lab 11/26/17 0825 11/27/17 0054 11/27/17 0630 11/28/17 0638  WBC 10.5 9.6 8.9 5.6  CREATININE 1.12 1.06 0.93 0.98  LATICACIDVEN  --  1.6 1.2  --     Estimated Creatinine Clearance: 45.8 mL/min (by C-G formula based on SCr of 0.98 mg/dL).    Allergies  Allergen Reactions  . Actos [Pioglitazone] Swelling  . Allegra [Fexofenadine] Other (See Comments)    stimulant  . Iodine Swelling and Other (See Comments)    DEADLY SICK  . Metformin And Related Diarrhea    Pt states that he has a reaction to the extended release metformin but takes the regular  . Onglyza [Saxagliptin] Other (See Comments)    Weight loss  . Shellfish Allergy     DEADLY SICK   Thank you for allowing pharmacy to be a part of this patient's care.  Kara Mead 11/28/2017 8:11 AM

## 2017-11-28 NOTE — Progress Notes (Signed)
PROGRESS NOTE    [name]   WJX:[id number]  DOB: Nov 21, 1932  DOA: 11/26/2017 PCP: [name], MD   Brief Narrative:  [name] [name] 84 y/o with CVA, DM2, AAA, HLD, HTN, Hypothyroid presents for fever 102 via EMS and 103 in ED. Seen in ED on 5/29 and found to have pneumonia and discharged with Augmentin and doxycycline but comes back for continued fevers.  Also had dyspnea, cough, dark urine, increased urination, elevated CBGs. CXR> RLL infiltrate U retention- 1000 cc after foley placed  He was admitted from 3/12 through 3/13 for a CVA. He has a loop recorder.      Subjective: Per wife and daughter, he is much more alert and active today   Assessment & Plan:    HCAP - RLL infiltrate Regurgitation/ vomiting episodes Esophageal narrowing  - 5/30> Esophagram: Distal esophageal mucosal ring allowed a 13 mm barium tablet did pass and thus is at least 13 mm in diameter. However, using fluids I was only able to distend this ring up to about 11 mm in diameter. This ring is of a size to potentially cause food distended. - possibly aspiration based on above esophagram- cont Cefepime- stop Vanc - strep pneumo neg - patients wife states he has a hiatal hernia but is not sure how it was diagnosed or who told them this- he has never had an EGD but has had a colonoscopy - - will start PPI in case this is esophagitis- will need GI f/u -  - still having fever last night- changed to Zosyn today  Active Problems:  Urinary retention - 1 L removed after foley placed- he was voiding at home- -  5/30- start Flomax> 5/31 have been told that Flomax cannot be crushed- change to Cardura - voiding trial tomorrow - per family, he follows with [facility] and been tried on 2 medications for urinary issues in the past  H/o CVA  /   Hyperlipidemia - 09/09/17- trouble with word finding - cont statin-aspirin and plavix - has loop recorder    Aneurysm of abdominal  vessel  S/p repair   Type 2 diabetes mellitus with vascular disease (HCC) - Metformin on hold- SSI    Hypothyroidism - synthroid   Hypokalemia - replace  Thrombocytopenia - noted to have mildly low platelets when admitted last year for a CVA  Dementia - most information is obtained from his wife  DVT prophylaxis: Lovenox Code Status: Intubate but no other resuscitation  Family Communication: daughter and Dad Disposition Plan: home with HHPT when improved Consultants:   GI Procedures:    Antimicrobials:  Anti-infectives (From admission, onward)   Start     Dose/Rate Route Frequency Ordered Stop   11/28/17 1000  piperacillin-tazobactam (ZOSYN) IVPB 3.375 g     3.375 g 12.5 mL/hr over 240 Minutes Intravenous Every 8 hours 11/28/17 0814     11/28/17 0600  vancomycin (VANCOCIN) IVPB 750 mg/150 ml premix  Status:  Discontinued     750 mg 150 mL/hr over 60 Minutes Intravenous Every 24 hours 11/27/17 0535 11/27/17 1533   11/27/17 1000  ceFEPIme (MAXIPIME) 1 g in sodium chloride 0.9 % 100 mL IVPB  Status:  Discontinued     1 g 200 mL/hr over 30 Minutes Intravenous Every 12 hours 11/27/17 0340 11/28/17 0748   11/27/17 0030  vancomycin (VANCOCIN) 1,250 mg in sodium chloride 0.9 % 250 mL IVPB     1,250 mg 166.7 mL/hr over 90 Minutes Intravenous  Once  11/27/17 0013 11/27/17 0313   11/27/17 0015  ceFEPIme (MAXIPIME) 1 g in sodium chloride 0.9 % 100 mL IVPB     1 g 200 mL/hr over 30 Minutes Intravenous  Once 11/27/17 0009 11/27/17 0136     Objective: Vitals:   11/28/17 0300 11/28/17 0610 11/28/17 0659 11/28/17 0725  BP:  (!) 145/76    Pulse:  (!) 105    Resp:  20    Temp: 99.2 F (37.3 C) (!) 102.3 F (39.1 C)  99 F (37.2 C)  TempSrc: Oral Oral    SpO2:  96%    Weight:   56.7 kg (125 lb)     Intake/Output Summary (Last 24 hours) at 11/28/2017 1509 Last data filed at 11/28/2017 1500 Gross per 24 hour  Intake 2845.83 ml  Output 3100 ml  Net -254.17 ml   Filed  Weights   11/27/17 0528 11/28/17 0659  Weight: 57.2 kg (126 lb 1.7 oz) 56.7 kg (125 lb)    Examination: General exam: Appears comfortable  HEENT: PERRLA, oral mucosa moist, no sclera icterus or thrush Respiratory system: crackles in RLL. Respiratory effort normal. Cardiovascular system: S1 & S2 heard, RRR.   Gastrointestinal system: Abdomen soft, non-tender, nondistended. Normal bowel sound. No organomegaly Central nervous system: Alert and oriented. No focal neurological deficits. Extremities: No cyanosis, clubbing or edema Skin: No rashes or ulcers Psychiatry:  Mood & affect appropriate.     Data Reviewed: I have personally reviewed following labs and imaging studies  CBC: Recent Labs  Lab 11/26/17 0825 11/27/17 0054 11/27/17 0630 11/28/17 0638  WBC 10.5 9.6 8.9 5.6  NEUTROABS 8.4* 8.1* 7.4  --   HGB 15.1 13.6 14.1 12.9*  HCT 44.0 38.4* 39.5 36.4*  MCV 93.2 93.0 93.2 92.2  PLT 156 128* 127* 924*   Basic Metabolic Panel: Recent Labs  Lab 11/26/17 0825 11/27/17 0054 11/27/17 0630 11/28/17 0638  NA 136 135 136 138  K 4.1 3.8 3.7 3.4*  CL 99* 104 106 108  CO2 25 21* 18* 21*  GLUCOSE 214* 331* 255* 244*  BUN 16 16 13 13   CREATININE 1.12 1.06 0.93 0.98  CALCIUM 9.7 9.1 8.7* 8.7*  MG  --   --   --  1.4*   GFR: Estimated Creatinine Clearance: 45.8 mL/min (by C-G formula based on SCr of 0.98 mg/dL). Liver Function Tests: Recent Labs  Lab 11/26/17 0825 11/27/17 0054 11/27/17 0630  AST 19 21 25   ALT 15* 17 18  ALKPHOS 67 66 63  BILITOT 1.1 0.6 1.1  PROT 7.4 6.7 6.5  ALBUMIN 3.6 3.6 3.3*   Recent Labs  Lab 11/27/17 0630  LIPASE 20   No results for input(s): AMMONIA in the last 168 hours. Coagulation Profile: No results for input(s): INR, PROTIME in the last 168 hours. Cardiac Enzymes: Recent Labs  Lab 11/27/17 0054 11/27/17 0630  TROPONINI <0.03 <0.03   BNP (last 3 results) No results for input(s): PROBNP in the last 8760 hours. HbA1C: No  results for input(s): HGBA1C in the last 72 hours. CBG: Recent Labs  Lab 11/27/17 1300 11/27/17 1633 11/27/17 2202 11/28/17 0747 11/28/17 1223  GLUCAP 225* 147* 253* 214* 222*   Lipid Profile: No results for input(s): CHOL, HDL, LDLCALC, TRIG, CHOLHDL, LDLDIRECT in the last 72 hours. Thyroid Function Tests: No results for input(s): TSH, T4TOTAL, FREET4, T3FREE, THYROIDAB in the last 72 hours. Anemia Panel: No results for input(s): VITAMINB12, FOLATE, FERRITIN, TIBC, IRON, RETICCTPCT in the last 72 hours. Urine  analysis:    Component Value Date/Time   COLORURINE YELLOW 11/27/2017 [place] 11/27/2017 0137   LABSPEC 1.016 11/27/2017 0137   PHURINE 6.0 11/27/2017 0137   GLUCOSEU >=500 (A) 11/27/2017 0137   HGBUR SMALL (A) 11/27/2017 0137   BILIRUBINUR NEGATIVE 11/27/2017 0137   KETONESUR 20 (A) 11/27/2017 0137   PROTEINUR 30 (A) 11/27/2017 0137   NITRITE NEGATIVE 11/27/2017 0137   LEUKOCYTESUR NEGATIVE 11/27/2017 0137   Sepsis Labs: @LABRCNTIP (procalcitonin:4,lacticidven:4) ) Recent Results (from the past 240 hour(s))  Culture, blood (routine x 2)     Status: None (Preliminary result)   Collection Time: 11/27/17 12:54 AM  Result Value Ref Range Status   Specimen Description   Final    BLOOD LEFT HAND Performed at [facility], [place] [address], [place], [place] [address]    Special Requests   Final    BOTTLES DRAWN AEROBIC AND ANAEROBIC Blood Culture adequate volume Performed at [place] [address] [place], [place] [address]    Culture   Final    NO GROWTH 1 DAY Performed at [facility], [place] [address], [place], [place] [address]    Report Status PENDING  Incomplete  Culture, blood (routine x 2)     Status: None (Preliminary result)   Collection Time: 11/27/17 12:54 AM  Result Value Ref Range Status   Specimen Description   Final    BLOOD LEFT FOREARM Performed at [facility], [place] [address], [place], [place] [address]    Special Requests   Final    BOTTLES DRAWN AEROBIC AND ANAEROBIC Blood Culture adequate volume Performed at [place] [address], [place], [place] [address]    Culture   Final    NO GROWTH 1 DAY Performed at [facility], [place] [address], [place], [place] [address]    Report Status PENDING  Incomplete  Urine culture     Status: Abnormal   Collection Time: 11/27/17  1:37 AM  Result Value Ref Range Status   Specimen Description   Final    URINE, CLEAN CATCH Performed at [facility], [place] [address] [place], [place] [address]    Special Requests   Final    NONE Performed at [facility], [place] [address], [place], [place] [address]    Culture (A)  Final    <10,000 COLONIES/mL INSIGNIFICANT GROWTH Performed at [place] [address], [place], [place] [address]    Report Status 11/28/2017 FINAL  Final  MRSA PCR Screening     Status: None   Collection Time: 11/27/17  4:30 PM  Result Value Ref Range Status   MRSA by PCR NEGATIVE NEGATIVE Final    Comment:        The GeneXpert MRSA Assay (FDA approved for NASAL specimens only), is one component of a comprehensive MRSA colonization surveillance program. It is not intended to diagnose MRSA infection nor to guide or monitor treatment for MRSA infections. Performed at [facility], [place] [address] [place], [place] [address]   Respiratory Panel by PCR     Status: None   Collection Time: 11/28/17  7:49 AM  Result Value Ref Range Status   Adenovirus NOT DETECTED NOT DETECTED Final   Coronavirus 229E NOT DETECTED NOT DETECTED Final   Coronavirus HKU1 NOT DETECTED NOT DETECTED Final   Coronavirus NL63 NOT DETECTED NOT DETECTED Final   Coronavirus  OC43 NOT DETECTED NOT DETECTED Final   Metapneumovirus NOT DETECTED NOT DETECTED Final   Rhinovirus / Enterovirus NOT DETECTED NOT DETECTED Final    Influenza A NOT DETECTED NOT DETECTED Final   Influenza B NOT DETECTED NOT DETECTED Final   Parainfluenza Virus 1 NOT DETECTED NOT DETECTED Final   Parainfluenza Virus 2 NOT DETECTED NOT DETECTED Final   Parainfluenza Virus 3 NOT DETECTED NOT DETECTED Final   Parainfluenza Virus 4 NOT DETECTED NOT DETECTED Final   Respiratory Syncytial Virus NOT DETECTED NOT DETECTED Final   Bordetella pertussis NOT DETECTED NOT DETECTED Final   Chlamydophila pneumoniae NOT DETECTED NOT DETECTED Final   Mycoplasma pneumoniae NOT DETECTED NOT DETECTED Final         Radiology Studies: Ct Head Wo Contrast  Result Date: 11/27/2017 CLINICAL DATA:  Status post fall, with small skin tear at the top of the head. Altered level of consciousness and fever. EXAM: CT HEAD WITHOUT CONTRAST TECHNIQUE: Contiguous axial images were obtained from the base of the skull through the vertex without intravenous contrast. COMPARISON:  CT of the head performed 09/08/2017, and MRI of the brain performed 09/09/2017 FINDINGS: Brain: No evidence of acute infarction, hemorrhage, hydrocephalus, extra-axial collection or mass lesion / mass effect. Prominence of the ventricles and sulci reflects mild to moderate cortical volume loss. Chronic lacunar infarcts are noted at the anterior left basal ganglia. Cerebellar atrophy is noted. Scattered periventricular and subcortical white matter change likely reflects small vessel ischemic microangiopathy. The brainstem and fourth ventricle are within normal limits. The cerebral hemispheres demonstrate grossly normal gray-white differentiation. No mass effect or midline shift is seen. Vascular: No hyperdense vessel or unexpected calcification. Skull: There is no evidence of fracture; visualized osseous structures are unremarkable in appearance. Sinuses/Orbits: Mild bilateral optic drusen are noted. The orbits are otherwise unremarkable. The paranasal sinuses and mastoid air cells are well-aerated. Other:  No significant soft tissue abnormalities are seen. IMPRESSION: 1. No evidence of traumatic intracranial injury or fracture. 2. Mild to moderate cortical volume loss and scattered small vessel ischemic microangiopathy. 3. Chronic lacunar infarcts at the anterior left basal ganglia. 4. Mild bilateral optic drusen noted. Electronically Signed   By: Garald Balding M.D.   On: 11/27/2017 02:08   Dg Esophagus  Result Date: 11/27/2017 CLINICAL DATA:  Vomiting.  Hiatal hernia. EXAM: ESOPHOGRAM/BARIUM SWALLOW TECHNIQUE: Single contrast examination was performed using  thin barium. FLUOROSCOPY TIME:  Fluoroscopy Time:  1 minutes 12 seconds Radiation Exposure Index (if provided by the fluoroscopic device): 8.1 mGy Number of Acquired Spot Images: 0 COMPARISON:  None. FINDINGS: The patient was not able to stand or sit up well. We performed the exam with the patient in the left posterior oblique position. There are occasional secondary and tertiary spasms in the esophagus. Primary peristaltic waves were intact on 3/4 swallows, within normal limits. Mild distal esophageal fold thickening is observed. There is a distal esophageal mucosal ring (Schatzki's ring) which I was not able to distend beyond about 11 mm in diameter using the liquids. However, a 13 mm barium tablet passed without difficulty into the stomach and beyond this Schatzki's ring, and accordingly the radius at least 13 mm in diameter when challenged with a solid. This is of a size to potentially cause symptoms. Loop recorder noted. The patient did have some coughing after drinking water to help swallow the pill, but no aspiration was observed with barium. IMPRESSION: 1. Distal esophageal mucosal ring allowed a 13 mm barium tablet did pass  and thus is at least 13 mm in diameter. However, using fluids I was only able to distend this ring up to about 11 mm in diameter. This ring is of a size to potentially cause food distended. 2. Mild distal esophageal fold  thickening, this can be seen in the setting of esophagitis. No ulcer identified. Primary peristaltic waves were preserved. Electronically Signed   By: Van Clines M.D.   On: 11/27/2017 14:23      Scheduled Meds: . clopidogrel  75 mg Oral Daily  . doxazosin  1 mg Oral Daily  . enoxaparin (LOVENOX) injection  40 mg Subcutaneous Q24H  . insulin aspart  0-9 Units Subcutaneous TID WC  . levothyroxine  75 mcg Oral Q0600  . pantoprazole  40 mg Oral Daily  . rosuvastatin  10 mg Oral QHS  . vitamin B-12  1,000 mcg Oral Daily   Continuous Infusions: . sodium chloride 125 mL/hr at 11/27/17 1638  . piperacillin-tazobactam (ZOSYN)  IV Stopped (11/28/17 1445)     LOS: 1 day    Time spent in minutes: 35    Debbe Odea, MD [facility] Pager: [email or web address] Password [email or web address] 11/28/2017, 3:09 PM

## 2017-11-28 NOTE — Progress Notes (Signed)
Results for WYN, NETTLE (MRN [id number]) as of 11/28/2017 12:47  Ref. Range 11/27/2017 13:00 11/27/2017 16:33 11/27/2017 22:02 11/28/2017 07:47 11/28/2017 12:23  Glucose-Capillary Latest Ref Range: 65 - 99 mg/dL 225 (H) 147 (H) 253 (H) 214 (H) 222 (H)  Noted that blood sugars continue to be greater than 180 mg/dl.  Recommend increasing Novolog correction scale to MODERATE TID &HS if blood sugar continue to elevated.  Will continue to monitor blood sugars while in the hospital.    Harvel Ricks RN BSN CDE Diabetes Coordinator Pager: [phone]  8am-5pm

## 2017-11-28 NOTE — Evaluation (Signed)
Physical Therapy Evaluation Patient Details Name: [name] [name] MRN: [id number] DOB: 17-Jun-1933 Today's Date: 11/28/2017   History of Present Illness  84 y/o with CVA, DM2, AAA, HLD, HTN, Hypothyroid presents for fever 102 via EMS and 103 in ED. Seen in Ed on 5/29 and found to have pneumonia   Clinical Impression  Pt admitted with above diagnosis. Pt currently with functional limitations due to the deficits listed below (see PT Problem List). Pt and family report he has had multiple falls at home; he will benefit from continued PT in acute setting and HHPT at d/c;  Pt will benefit from skilled PT to increase their independence and safety with mobility to allow discharge to the venue listed below.      Follow Up Recommendations Home health PT;Supervision for mobility/OOB    Equipment Recommendations  Rolling walker with 5" wheels    Recommendations for Other Services       Precautions / Restrictions Precautions Precautions: Fall Precaution Comments: hx of recent falls, at least 3 in last 2-3 months--difficult to get accurate time frame from pt Restrictions Weight Bearing Restrictions: No      Mobility  Bed Mobility Overal bed mobility: Needs Assistance Bed Mobility: Supine to Sit     Supine to sit: Min guard     General bed mobility comments: cues for safety, moving more slowly  Transfers Overall transfer level: Needs assistance Equipment used: Rolling walker (2 wheeled) Transfers: Sit to/from Stand Sit to Stand: Min guard         General transfer comment: cues for safety and hand placement  Ambulation/Gait Ambulation/Gait assistance: Min guard Ambulation Distance (Feet): 15 Feet(in room--dtr did not want pt to do too much, concerned about falls) Assistive device: Rolling walker (2 wheeled) Gait Pattern/deviations: Step-through pattern;Decreased stride length     General Gait Details: no LOB, light reliance on RW, min/guard for safety  Stairs             Wheelchair Mobility    Modified Rankin (Stroke Patients Only)       Balance Overall balance assessment: Needs assistance;History of Falls(strong hx of falls, at least 3 in last 2-45mos; has had multiple falls resulting in injury)   Sitting balance-Leahy Scale: Good     Standing balance support: No upper extremity supported Standing balance-Leahy Scale: Fair Standing balance comment: able to static stand without LOB, no UE support                             Pertinent Vitals/Pain Pain Assessment: No/denies pain    Home Living Family/patient expects to be discharged to:: Private residence Living Arrangements: Spouse/significant other Available Help at Discharge: Family;Available 24 hours/day(wife) Type of Home: House Home Access: Level entry     Home Layout: One level Home Equipment: Cane - single point      Prior Function Level of Independence: Independent         Comments: pt reports independent at baseline, dtr reports he is unsafe, has had multiple falls     Hand Dominance        Extremity/Trunk Assessment   Upper Extremity Assessment Upper Extremity Assessment: Overall WFL for tasks assessed    Lower Extremity Assessment Lower Extremity Assessment: Overall WFL for tasks assessed       Communication      Cognition Arousal/Alertness: Awake/alert Behavior During Therapy: WFL for tasks assessed/performed   Area of Impairment: Attention  Current Attention Level: Sustained           General Comments: pt requires frequent redirection to current task      General Comments      Exercises     Assessment/Plan    PT Assessment Patient needs continued PT services  PT Problem List Decreased strength;Decreased range of motion;Decreased balance;Decreased knowledge of use of DME       PT Treatment Interventions DME instruction;Gait training;Functional mobility training;Therapeutic activities;Therapeutic  exercise;Patient/family education;Balance training    PT Goals (Current goals can be found in the Care Plan section)  Acute Rehab PT Goals Patient Stated Goal: home  PT Goal Formulation: With patient Time For Goal Achievement: 12/12/17 Potential to Achieve Goals: Good    Frequency Min 3X/week   Barriers to discharge        Co-evaluation               AM-PAC PT "6 Clicks" Daily Activity  Outcome Measure Difficulty turning over in bed (including adjusting bedclothes, sheets and blankets)?: None Difficulty moving from lying on back to sitting on the side of the bed? : None Difficulty sitting down on and standing up from a chair with arms (e.g., wheelchair, bedside commode, etc,.)?: A Little Help needed moving to and from a bed to chair (including a wheelchair)?: A Little Help needed walking in hospital room?: A Little Help needed climbing 3-5 steps with a railing? : A Lot 6 Click Score: 19    End of Session Equipment Utilized During Treatment: Gait belt Activity Tolerance: Patient tolerated treatment well Patient left: in chair;with family/visitor present;with call bell/phone within reach   PT Visit Diagnosis: History of falling (Z91.81)    Time: 4970-2637 PT Time Calculation (min) (ACUTE ONLY): 24 min   Charges:   PT Evaluation $PT Eval Low Complexity: 1 Low PT Treatments $Gait Training: 8-22 mins   PT G CodesKenyon [name], PT Pager: [phone] 11/28/2017   [facility] 11/28/2017, 12:47 PM

## 2017-11-28 NOTE — Evaluation (Signed)
Clinical/Bedside Swallow Evaluation Patient Details  Name: [name] [name] MRN: [id number] Date of Birth: [birth year]  Today's Date: 11/28/2017 Time: SLP Start Time (ACUTE ONLY): 1029 SLP Stop Time (ACUTE ONLY): 1116 SLP Time Calculation (min) (ACUTE ONLY): 47 min  Past Medical History:  Past Medical History:  Diagnosis Date  . AAA (abdominal aortic aneurysm) ([place])   . 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 Diabetes mellitus without complication ([place])   . Hiatal hernia   . HOH (hard of hearing)   . Hyperlipidemia   . Stroke [facility])    Past Surgical History:  Past Surgical History:  Procedure Laterality Date  . ABDOMINAL AORTIC ANEURYSM REPAIR  10-2003   also had iliac aneurysm repair  . CHOLECYSTECTOMY     Laparoscopic cholecystectomy  . EYE SURGERY Bilateral    cataracts  . HERNIA REPAIR    . LOOP RECORDER INSERTION N/A 09/10/2017   Procedure: LOOP RECORDER INSERTION;  Surgeon: Deboraha Sprang, MD;  Location: [facility];  Service: Cardiovascular;  Laterality: N/A;  . TEE WITHOUT CARDIOVERSION N/A 09/10/2017   Procedure: TRANSESOPHAGEAL ECHOCARDIOGRAM (TEE);  Surgeon: Jerline Pain, MD;  Location: [facility] ENDOSCOPY;  Service: Cardiovascular;  Laterality: N/A;   HPI:  [name] [name] adm to [facility] with weakness, recent falling - vomited in ED.  PMH + for basal ganglia cva, - anterior left.  Pt has a hx of frequent belching and sensing food lodging at distal esophagus.  Plan is for EGD after released from hospital - has current pna.  Swallow evaluation ordered - Pt admits to coughing "some" easily when consuming po too quickly.    Assessment / Plan / Recommendation Clinical Impression  Pt presents with possible component of pharyngeal-cervical esophageal dysphagia - suspect secondary to known primary esophageal dysphagia.  Pt with intermittent cough with water - which he states is baseline and chronic.  Chin tuck posture was to be tested but unable due to pt's discomfort with this position.   Nectar thickened liquids were not clinically tolerated better as pt presents with belching post-swallow and pointed to distal sternum to indicate stasis.  Educated pt to dietary recommendations made from Md due to esophagram results.  Reviewed PH neutral status of water making it "safest" to aspirate.  Provided family/pt with written compensation strategies/education re: esophageal dysphagia and heimlich manuever.  Note plans for pt to have endoscopy done as OP - and pt report that he used to consume Coke to help clear esophagus - causing SLP to suspect chronic dysphagia.   All education completed to help mitigate dysphagia - No follow up needed.  SLP Visit Diagnosis: Dysphagia, unspecified (R13.10)    Aspiration Risk  Mild aspiration risk    Diet Recommendation Dysphagia 2 (Fine chop);Thin liquid(per md)   Liquid Administration via: Cup Medication Administration: Crushed with puree Supervision: Patient able to self feed Compensations: Minimize environmental distractions;Slow rate;Small sips/bites(start meals with liquids, drink liquids t/o meal, small frequent meals) Postural Changes: Remain upright for at least 30 minutes after po intake;Seated upright at 90 degrees    Other  Recommendations Oral Care Recommendations: Oral care BID   Follow up Recommendations None      Frequency and Duration   n/a         Prognosis        Swallow Study   General Date of Onset: 11/28/17 HPI: [name] [name] adm to [facility] with weakness, recent falling - vomited in ED.  PMH + for basal ganglia cva, - anterior  left.  Pt has a hx of frequent belching and sensing food lodging at distal esophagus.  Plan is for EGD after released from hospital - has current pna.  Swallow evaluation ordered - Pt admits to coughing "some" easily when consuming po too quickly.  Previous Swallow Assessment: Esophagram showed Shatzki's ring and ? distal wall thickening = cough with water when taking barium tablet.  MD placed pt on  dys2/thin meds crushed.   Diet Prior to this Study: Dysphagia 2 (chopped);Thin liquids Temperature Spikes Noted: Yes Respiratory Status: Room air History of Recent Intubation: No Behavior/Cognition: Alert;Cooperative;Pleasant mood Oral Cavity Assessment: Within Functional Limits Oral Care Completed by SLP: No Oral Cavity - Dentition: Adequate natural dentition Vision: Functional for self-feeding Self-Feeding Abilities: Able to feed self Patient Positioning: Upright in bed Baseline Vocal Quality: Normal Volitional Cough: Strong Volitional Swallow: Able to elicit    Oral/Motor/Sensory Function Overall Oral Motor/Sensory Function: Within functional limits   Ice Chips Ice chips: Not tested   Thin Liquid Thin Liquid: Impaired Presentation: Cup;Self Fed Pharyngeal  Phase Impairments: Cough - Immediate Other Comments: intermittent cough- chin tuck not tested due to pt having discomfort with posture    Nectar Thick Nectar Thick Liquid: Impaired Presentation: Self Fed;Cup   Honey Thick Honey Thick Liquid: Not tested   Puree Puree: Within functional limits Presentation: Self Fed;Spoon   Solid   GO   Solid: Not tested        [name] [name] 11/28/2017,11:35 AM  Luanna Salk, [facility] SLP [phone]

## 2017-11-29 LAB — GLUCOSE, CAPILLARY
Glucose-Capillary: 215 mg/dL — ABNORMAL HIGH (ref 65–99)
Glucose-Capillary: 237 mg/dL — ABNORMAL HIGH (ref 65–99)
Glucose-Capillary: 239 mg/dL — ABNORMAL HIGH (ref 65–99)
Glucose-Capillary: 261 mg/dL — ABNORMAL HIGH (ref 65–99)
Glucose-Capillary: 300 mg/dL — ABNORMAL HIGH (ref 65–99)
Glucose-Capillary: 312 mg/dL — ABNORMAL HIGH (ref 65–99)

## 2017-11-29 MED ORDER — SACCHAROMYCES BOULARDII 250 MG PO CAPS
250.0000 mg | ORAL_CAPSULE | Freq: Two times a day (BID) | ORAL | Status: DC
  Administered 2017-11-29 – 2017-11-30 (×2): 250 mg via ORAL
  Filled 2017-11-29 (×2): qty 1

## 2017-11-29 MED ORDER — METFORMIN HCL 500 MG PO TABS
1000.0000 mg | ORAL_TABLET | Freq: Two times a day (BID) | ORAL | Status: DC
  Administered 2017-11-29 – 2017-11-30 (×3): 1000 mg via ORAL
  Filled 2017-11-29 (×3): qty 2

## 2017-11-29 NOTE — Progress Notes (Signed)
PROGRESS NOTE    [name] [name]   UTM:[id number]  DOB: 1932-12-28  DOA: 11/26/2017 PCP: [name], MD   Brief Narrative:  [name] [name] 84 y/o with CVA, DM2, AAA, HLD, HTN, Hypothyroid presents for fever 102 via EMS and 103 in ED. Seen in ED on 5/29 and found to have pneumonia and discharged with Augmentin and doxycycline but comes back for continued fevers.  Also had dyspnea, cough, dark urine, increased urination, elevated CBGs. CXR> RLL infiltrate U retention- 1000 cc after foley placed  He was last admitted from 3/12 through 3/13 for a CVA. He has a loop recorder.   Subjective: The patient is urinating well without the foley cath. No cough and no dyspnea. Moving from bed to chair well.    Assessment & Plan:    HCAP - RLL infiltrate Regurgitation/ vomiting episodes Esophageal narrowing  - 5/30> Esophagram: Distal esophageal mucosal ring allowed a 13 mm barium tablet did pass and thus is at least 13 mm in diameter. However, using fluids I was only able to distend this ring up to about 11 mm in diameter. This ring is of a size to potentially cause food distended. - possibly aspiration based on above esophagram - strep pneumo neg - patients wife states he has a hiatal hernia but is not sure how it was diagnosed or who told them this- he has never had an EGD but has had a colonoscopy - - will start PPI in case this is esophagitis- will need GI f/u -  -  Fever curve improving- cont Zosyn  - d/c IVF- eating without vomiting now- Gi will see him  Active Problems:  Urinary retention - 1 L removed after foley placed- he was voiding at home- -  5/30- start Flomax> 5/31 have been told that Flomax cannot be crushed- change to Cardura - voiding well now - per family, he follows with [facility] and been tried on 2 medications for urinary issues in the past  H/o CVA  /   Hyperlipidemia - 09/09/17- trouble with word finding - cont statin-aspirin and  plavix - has loop recorder    Aneurysm of abdominal vessel  S/p repair   Type 2 diabetes mellitus with vascular disease (HCC) - Metformin on hold- SSI    Hypothyroidism - synthroid   Hypokalemia - replace  Thrombocytopenia - noted to have mildly low platelets when admitted last year for a CVA  Dementia - most information is obtained from his wife  DVT prophylaxis: Lovenox Code Status: Intubate but no other resuscitation  Family Communication: daughter and Dad Disposition Plan: home with HHPT - possibly tomorrow Consultants:   GI Procedures:    Antimicrobials:  Anti-infectives (From admission, onward)   Start     Dose/Rate Route Frequency Ordered Stop   11/28/17 1000  piperacillin-tazobactam (ZOSYN) IVPB 3.375 g     3.375 g 12.5 mL/hr over 240 Minutes Intravenous Every 8 hours 11/28/17 0814     11/28/17 0600  vancomycin (VANCOCIN) IVPB 750 mg/150 ml premix  Status:  Discontinued     750 mg 150 mL/hr over 60 Minutes Intravenous Every 24 hours 11/27/17 0535 11/27/17 1533   11/27/17 1000  ceFEPIme (MAXIPIME) 1 g in sodium chloride 0.9 % 100 mL IVPB  Status:  Discontinued     1 g 200 mL/hr over 30 Minutes Intravenous Every 12 hours 11/27/17 0340 11/28/17 0748   11/27/17 0030  vancomycin (VANCOCIN) 1,250 mg in sodium chloride 0.9 % 250 mL IVPB  1,250 mg 166.7 mL/hr over 90 Minutes Intravenous  Once 11/27/17 0013 11/27/17 0313   11/27/17 0015  ceFEPIme (MAXIPIME) 1 g in sodium chloride 0.9 % 100 mL IVPB     1 g 200 mL/hr over 30 Minutes Intravenous  Once 11/27/17 0009 11/27/17 0136     Objective: Vitals:   11/29/17 0241 11/29/17 0509 11/29/17 1030 11/29/17 1458  BP:  (!) 161/86 (!) 151/87 (!) 158/77  Pulse: 98 96 93 85  Resp:  20  18  Temp: (!) 100.8 F (38.2 C) 99.1 F (37.3 C)  (!) 97.5 F (36.4 C)  TempSrc: Oral Oral  Oral  SpO2: 93% 93% 95% 97%  Weight:        Intake/Output Summary (Last 24 hours) at 11/29/2017 1600 Last data filed at 11/29/2017  1447 Gross per 24 hour  Intake 1890 ml  Output 4450 ml  Net -2560 ml   Filed Weights   11/27/17 0528 11/28/17 0659  Weight: 57.2 kg (126 lb 1.7 oz) 56.7 kg (125 lb)    Examination: General exam: Appears comfortable  HEENT: PERRLA, oral mucosa moist, no sclera icterus or thrush Respiratory system: still has crackles in RLL. Respiratory effort normal. Cardiovascular system: S1 & S2 heard, RRR.   Gastrointestinal system: Abdomen soft, non-tender, nondistended. Normal bowel sound. No organomegaly Central nervous system: Alert and oriented. No focal neurological deficits. Extremities: No cyanosis, clubbing or edema Skin: No rashes or ulcers Psychiatry:  Mood & affect appropriate.     Data Reviewed: I have personally reviewed following labs and imaging studies  CBC: Recent Labs  Lab 11/26/17 0825 11/27/17 0054 11/27/17 0630 11/28/17 0638  WBC 10.5 9.6 8.9 5.6  NEUTROABS 8.4* 8.1* 7.4  --   HGB 15.1 13.6 14.1 12.9*  HCT 44.0 38.4* 39.5 36.4*  MCV 93.2 93.0 93.2 92.2  PLT 156 128* 127* 016*   Basic Metabolic Panel: Recent Labs  Lab 11/26/17 0825 11/27/17 0054 11/27/17 0630 11/28/17 0638  NA 136 135 136 138  K 4.1 3.8 3.7 3.4*  CL 99* 104 106 108  CO2 25 21* 18* 21*  GLUCOSE 214* 331* 255* 244*  BUN 16 16 13 13   CREATININE 1.12 1.06 0.93 0.98  CALCIUM 9.7 9.1 8.7* 8.7*  MG  --   --   --  1.4*   GFR: Estimated Creatinine Clearance: 45.8 mL/min (by C-G formula based on SCr of 0.98 mg/dL). Liver Function Tests: Recent Labs  Lab 11/26/17 0825 11/27/17 0054 11/27/17 0630  AST 19 21 25   ALT 15* 17 18  ALKPHOS 67 66 63  BILITOT 1.1 0.6 1.1  PROT 7.4 6.7 6.5  ALBUMIN 3.6 3.6 3.3*   Recent Labs  Lab 11/27/17 0630  LIPASE 20   No results for input(s): AMMONIA in the last 168 hours. Coagulation Profile: No results for input(s): INR, PROTIME in the last 168 hours. Cardiac Enzymes: Recent Labs  Lab 11/27/17 0054 11/27/17 0630  TROPONINI <0.03 <0.03    BNP (last 3 results) No results for input(s): PROBNP in the last 8760 hours. HbA1C: No results for input(s): HGBA1C in the last 72 hours. CBG: Recent Labs  Lab 11/28/17 2135 11/29/17 0004 11/29/17 0737 11/29/17 1214 11/29/17 1259  GLUCAP 356* 300* 237* 312* 261*   Lipid Profile: No results for input(s): CHOL, HDL, LDLCALC, TRIG, CHOLHDL, LDLDIRECT in the last 72 hours. Thyroid Function Tests: No results for input(s): TSH, T4TOTAL, FREET4, T3FREE, THYROIDAB in the last 72 hours. Anemia Panel: No results for input(s): VITAMINB12,  FOLATE, FERRITIN, TIBC, IRON, RETICCTPCT in the last 72 hours. Urine analysis:    Component Value Date/Time   COLORURINE YELLOW 11/27/2017 [place] 11/27/2017 0137   LABSPEC 1.016 11/27/2017 0137   PHURINE 6.0 11/27/2017 0137   GLUCOSEU >=500 (A) 11/27/2017 0137   HGBUR SMALL (A) 11/27/2017 0137   BILIRUBINUR NEGATIVE 11/27/2017 0137   KETONESUR 20 (A) 11/27/2017 0137   PROTEINUR 30 (A) 11/27/2017 0137   NITRITE NEGATIVE 11/27/2017 0137   LEUKOCYTESUR NEGATIVE 11/27/2017 0137   Sepsis Labs: @LABRCNTIP (XFGHWEXHBZJIR:6,VELFYBOFBPZ:0) ) Recent Results (from the past 240 hour(s))  Culture, blood (routine x 2)     Status: None (Preliminary result)   Collection Time: 11/27/17 12:54 AM  Result Value Ref Range Status   Specimen Description   Final    BLOOD LEFT HAND Performed at [facility], [place] [address], [place], [place] [address]    Special Requests   Final    BOTTLES DRAWN AEROBIC AND ANAEROBIC Blood Culture adequate volume Performed at [place] [address], [place], [place] [address]    Culture   Final    NO GROWTH 2 DAYS Performed at [facility] [address], [place], [place] [address]    Report Status PENDING  Incomplete  Culture, blood (routine x 2)     Status: None (Preliminary result)   Collection Time: 11/27/17 12:54 AM  Result Value Ref Range Status    Specimen Description   Final    BLOOD LEFT FOREARM Performed at [facility], [place] [address] [place], [place] [address]    Special Requests   Final    BOTTLES DRAWN AEROBIC AND ANAEROBIC Blood Culture adequate volume Performed at [place] [address], [place], [place] [address]    Culture   Final    NO GROWTH 2 DAYS Performed at [place] [address], [place], [place] [address]    Report Status PENDING  Incomplete  Urine culture     Status: Abnormal   Collection Time: 11/27/17  1:37 AM  Result Value Ref Range Status   Specimen Description   Final    URINE, CLEAN CATCH Performed at [facility], [place] [address] [place], [place] [address]    Special Requests   Final    NONE Performed at [facility], [place] [address] [place], [place] [address]    Culture (A)  Final    <10,000 COLONIES/mL INSIGNIFICANT GROWTH Performed at [place] [address], [place], [place] [address]    Report Status 11/28/2017 FINAL  Final  MRSA PCR Screening     Status: None   Collection Time: 11/27/17  4:30 PM  Result Value Ref Range Status   MRSA by PCR NEGATIVE NEGATIVE Final    Comment:        The GeneXpert MRSA Assay (FDA approved for NASAL specimens only), is one component of a comprehensive MRSA colonization surveillance program. It is not intended to diagnose MRSA infection nor to guide or monitor treatment for MRSA infections. Performed at [facility], [place] [address] [place], [place] [address]   Respiratory Panel by PCR     Status: None   Collection Time: 11/28/17  7:49 AM  Result Value Ref Range Status   Adenovirus NOT DETECTED NOT DETECTED Final   Coronavirus 229E NOT DETECTED NOT DETECTED Final   Coronavirus HKU1 NOT DETECTED NOT DETECTED Final  Coronavirus NL63 NOT DETECTED NOT DETECTED Final   Coronavirus OC43 NOT DETECTED NOT DETECTED Final   Metapneumovirus NOT  DETECTED NOT DETECTED Final   Rhinovirus / Enterovirus NOT DETECTED NOT DETECTED Final   Influenza A NOT DETECTED NOT DETECTED Final   Influenza B NOT DETECTED NOT DETECTED Final   Parainfluenza Virus 1 NOT DETECTED NOT DETECTED Final   Parainfluenza Virus 2 NOT DETECTED NOT DETECTED Final   Parainfluenza Virus 3 NOT DETECTED NOT DETECTED Final   Parainfluenza Virus 4 NOT DETECTED NOT DETECTED Final   Respiratory Syncytial Virus NOT DETECTED NOT DETECTED Final   Bordetella pertussis NOT DETECTED NOT DETECTED Final   Chlamydophila pneumoniae NOT DETECTED NOT DETECTED Final   Mycoplasma pneumoniae NOT DETECTED NOT DETECTED Final         Radiology Studies: No results found.    Scheduled Meds: . clopidogrel  75 mg Oral Daily  . doxazosin  1 mg Oral Daily  . enoxaparin (LOVENOX) injection  40 mg Subcutaneous Q24H  . insulin aspart  0-9 Units Subcutaneous TID WC  . levothyroxine  75 mcg Oral Q0600  . metFORMIN  1,000 mg Oral BID WC  . pantoprazole  40 mg Oral Daily  . rosuvastatin  10 mg Oral QHS  . vitamin B-12  1,000 mcg Oral Daily   Continuous Infusions: . piperacillin-tazobactam (ZOSYN)  IV Stopped (11/29/17 1349)     LOS: 2 days    Time spent in minutes: 35    Debbe Odea, MD [facility] Pager: [email or web address] Password [email or web address] 11/29/2017, 4:00 PM

## 2017-11-29 NOTE — Consult Note (Addendum)
Referring Provider:   Dr. Reggy Eye Primary Care Physician:  [name], MD Primary Gastroenterologist:  Dr. Oletta Lamas  Reason for Consultation: Esophageal dysphagia  HPI: [name] [name] is a 84 y.o. male admitted with his first ever episode of pneumonia, right-sided, raising question of possible aspiration.    He has a long-standing history of intermittent dysphagia to solid food, especially dry meat such as white chicken meat.  He might have episodes of transient dysphagia lasting a minute or so, sometimes prompting him to regurgitate the food, at other times, he could relieve it by drinking Coke and burping.  These episodes might occur perhaps twice a month although more recently, they have been somewhat more frequent, probably several times in the past 2 weeks.  He is maintained chronically on omeprazole 20 mg daily as an outpatient.  In addition, the patient has occasional coughing while swallowing liquids which sounds like it is of long-standing, as well as sometimes difficulty swallowing pills (they will not leave his mouth), but it turns out he is tilting his head backward when he tries to swallow pills which could be contributing to that problem.  The patient had a stroke about 2 months ago without any significant impairment of his speech, and there has been no obvious worsening of his dysphagia symptoms since that time.  Bedside evaluation on this hospitalization by the speech therapist showed normal oral function, impaired handling of thin liquids and nectar thick liquids (immediate cough), but normal handling of pured food.  On this admission, the patient also had a barium swallow, which I have reviewed, and which showed a definite, albeit fairly widely patent, Schatzki's ring (11 mm diameter) that did not prevent passage of the 13 mm barium tablet.  There were some tertiary contractions but no evidence of esophageal stasis or esophageal dilatation to suggest severe  dysmotility or esophagogastric outflow obstruction.    Past Medical History:  Diagnosis Date  . AAA (abdominal aortic aneurysm) ([place])   . 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 Diabetes mellitus without complication ([place])   . Hiatal hernia   . HOH (hard of hearing)   . Hyperlipidemia   . Stroke [facility])     Past Surgical History:  Procedure Laterality Date  . ABDOMINAL AORTIC ANEURYSM REPAIR  10-2003   also had iliac aneurysm repair  . CHOLECYSTECTOMY     Laparoscopic cholecystectomy  . EYE SURGERY Bilateral    cataracts  . HERNIA REPAIR    . LOOP RECORDER INSERTION N/A 09/10/2017   Procedure: LOOP RECORDER INSERTION;  Surgeon: Deboraha Sprang, MD;  Location: [place] CV LAB;  Service: Cardiovascular;  Laterality: N/A;  . TEE WITHOUT CARDIOVERSION N/A 09/10/2017   Procedure: TRANSESOPHAGEAL ECHOCARDIOGRAM (TEE);  Surgeon: Jerline Pain, MD;  Location: [facility] ENDOSCOPY;  Service: Cardiovascular;  Laterality: N/A;    Prior to Admission medications   Medication Sig Start Date End Date Taking? Authorizing Provider  amoxicillin-clavulanate (AUGMENTIN) 875-125 MG tablet Take 1 tablet by mouth 2 (two) times daily. One po bid x 7 days 11/26/17  Yes Mesner, Corene Cornea, MD  cholecalciferol (VITAMIN D) 1000 units tablet Take 1,000 Units by mouth daily with supper.   Yes [provider]  clopidogrel (PLAVIX) 75 MG tablet Take 1 tablet (75 mg total) by mouth daily. 09/11/17  Yes Rama, Venetia Maxon, MD  Coenzyme Q10 (COQ10) 100 MG CAPS Take 100 mg by mouth daily.   Yes [provider]  diazepam (VALIUM) 5 MG tablet Take 2.5-5 mg  by mouth at bedtime as needed for anxiety (restless legs).   Yes [provider]  doxycycline (VIBRAMYCIN) 100 MG capsule Take 1 capsule (100 mg total) by mouth 2 (two) times daily. One po bid x 7 days 11/26/17  Yes Mesner, Corene Cornea, MD  levothyroxine (SYNTHROID, LEVOTHROID) 75 MCG tablet Take 75 mcg by mouth daily before breakfast.  09/15/13  Yes [provider]  loratadine (CLARITIN) 10 MG tablet Take 10 mg by mouth daily.    Yes [provider]  metFORMIN (GLUCOPHAGE) 500 MG tablet Take 1,000 mg by mouth 2 (two) times daily. 11/09/17  Yes [provider]  Multiple Vitamin (MULTIVITAMIN WITH MINERALS) TABS tablet Take 1 tablet by mouth daily.   Yes [provider]  Omega-3 1000 MG CAPS Take 1,000 mg by mouth daily.   Yes [provider]  omeprazole (PRILOSEC) 20 MG capsule Take 20 mg by mouth at bedtime.    Yes [provider]  OVER THE COUNTER MEDICATION Take 2 tablets by mouth daily. OTC Bee Caps Dietary Supplement   Yes [provider]  rosuvastatin (CRESTOR) 10 MG tablet Take 10 mg by mouth at bedtime.    Yes [provider]  sildenafil (REVATIO) 20 MG tablet Take 40-100 mg by mouth as needed (erectile dysfunction). For sexual activity 10/31/16  Yes [provider]  vitamin B-12 (CYANOCOBALAMIN) 1000 MCG tablet Take 1,000 mcg by mouth daily.   Yes [provider]  aspirin 325 MG tablet Take 1 tablet (325 mg total) by mouth daily. Stop in 3 weeks and then just take Plavix alone. Patient not taking: Reported on 11/26/2017 09/10/17   Rama, Venetia Maxon, MD    Current Facility-Administered Medications  Medication Dose Route Frequency Provider Last Rate Last Dose  . acetaminophen (TYLENOL) tablet 650 mg  650 mg Oral Q6H PRN Rise Patience, MD   650 mg at 11/29/17 1044   Or  . acetaminophen (TYLENOL) suppository 650 mg  650 mg Rectal Q6H PRN Rise Patience, MD      . clopidogrel (PLAVIX) tablet 75 mg  75 mg Oral Daily Rise Patience, MD   75 mg at 11/29/17 1032  . diazepam (VALIUM) tablet 2.5 mg  2.5 mg Oral QHS PRN Debbe Odea, MD      . doxazosin (CARDURA) tablet 1 mg  1 mg Oral Daily Debbe Odea, MD   1 mg at 11/29/17 1032  . enoxaparin (LOVENOX) injection 40 mg  40 mg Subcutaneous Q24H Rise Patience, MD   40 mg at 11/27/17 2156  .  hydrALAZINE (APRESOLINE) injection 5 mg  5 mg Intravenous Q6H PRN Rizwan, Eunice Blase, MD      . insulin aspart (novoLOG) injection 0-9 Units  0-9 Units Subcutaneous TID WC Rise Patience, MD   5 Units at 11/29/17 1301  . levothyroxine (SYNTHROID, LEVOTHROID) tablet 75 mcg  75 mcg Oral Q0600 Rise Patience, MD   75 mcg at 11/29/17 0743  . metFORMIN (GLUCOPHAGE) tablet 1,000 mg  1,000 mg Oral BID WC Debbe Odea, MD   1,000 mg at 11/29/17 1044  . ondansetron (ZOFRAN) tablet 4 mg  4 mg Oral Q6H PRN Rise Patience, MD       Or  . ondansetron [facility]) injection 4 mg  4 mg Intravenous Q6H PRN Rise Patience, MD      . pantoprazole (PROTONIX) EC tablet 40 mg  40 mg Oral Daily Rise Patience, MD   40 mg at  11/29/17 1032  . piperacillin-tazobactam (ZOSYN) IVPB 3.375 g  3.375 g Intravenous Q8H Adrian Saran, [place]   Stopped at 11/29/17 1349  . rosuvastatin (CRESTOR) tablet 10 mg  10 mg Oral QHS Rise Patience, MD   10 mg at 11/27/17 2156  . vitamin B-12 (CYANOCOBALAMIN) tablet 1,000 mcg  1,000 mcg Oral Daily Rise Patience, MD   1,000 mcg at 11/29/17 1032    Allergies as of 11/26/2017 - Review Complete 11/26/2017  Allergen Reaction Noted  . Actos [pioglitazone] Swelling 01/16/2017  . Allegra [fexofenadine] Other (See Comments) 01/16/2017  . Iodine Swelling and Other (See Comments) 12/09/2013  . Metformin and related Diarrhea 01/20/2014  . Onglyza [saxagliptin] Other (See Comments) 01/16/2017  . Shellfish allergy  12/09/2013    Family History  Problem Relation Age of Onset  . Arthritis Mother        Rheumatoid arthritis    Social History   Socioeconomic History  . Marital status: Married    Spouse name: Not on file  . Number of children: Not on file  . Years of education: Not on file  . Highest education level: Not on file  Occupational History  . Not on file  Social Needs  . Financial resource strain: Not on file  . Food insecurity:    Worry: Not  on file    Inability: Not on file  . Transportation needs:    Medical: Not on file    Non-medical: Not on file  Tobacco Use  . Smoking status: Never Smoker  . Smokeless tobacco: Never Used  Substance and Sexual Activity  . Alcohol use: No  . Drug use: No  . Sexual activity: Not on file  Lifestyle  . Physical activity:    Days per week: Not on file    Minutes per session: Not on file  . Stress: Not on file  Relationships  . Social connections:    Talks on phone: Not on file    Gets together: Not on file    Attends religious service: Not on file    Active member of club or organization: Not on file    Attends meetings of clubs or organizations: Not on file    Relationship status: Not on file  . Intimate partner violence:    Fear of current or ex partner: Not on file    Emotionally abused: Not on file    Physically abused: Not on file    Forced sexual activity: Not on file  Other Topics Concern  . Not on file  Social History Narrative  . Not on file    Review of Systems: See history of present illness  Physical Exam: Vital signs in last 24 hours: Temp:  [97.5 F (36.4 C)-100.8 F (38.2 C)] 97.5 F (36.4 C) (06/01 1458) Pulse Rate:  [85-100] 85 (06/01 1458) Resp:  [18-20] 18 (06/01 1458) BP: (135-161)/(77-87) 158/77 (06/01 1458) SpO2:  [93 %-97 %] 97 % (06/01 1458) Last BM Date: 11/28/17  This is a pleasant, somewhat frail-appearing elderly Caucasian gentleman in no distress.  He is sitting in the bedside chair and is not in respiratory distress.  He is anicteric and without pallor.  Chest exam shows multiple inspiratory crackles at the right base, no wheezes.  Left lung clear.  Heart normal.  Abdomen slightly tympanitic in the right upper quadrant, no significant tenderness, no mass-effect.  No peripheral edema.  No overt focal neurologic deficits.  Cognition seems to be generally intact although formal  memory or mental status testing was not performed.  Intake/Output  from previous day: 05/31 0701 - 06/01 0700 In: 3325.8 [P.O.:480; I.V.:2795.8; IV Piggyback:50] Out: 3800 [Urine:3800] Intake/Output this shift: Total I/O In: 1410 [P.O.:360; Other:1000; IV Piggyback:50] Out: 1950 [Urine:1950]  Lab Results: Recent Labs    11/27/17 0054 11/27/17 0630 11/28/17 0638  WBC 9.6 8.9 5.6  HGB 13.6 14.1 12.9*  HCT 38.4* 39.5 36.4*  PLT 128* 127* 143*   BMET Recent Labs    11/27/17 0054 11/27/17 0630 11/28/17 0638  NA 135 136 138  K 3.8 3.7 3.4*  CL 104 106 108  CO2 21* 18* 21*  GLUCOSE 331* 255* 244*  BUN 16 13 13   CREATININE 1.06 0.93 0.98  CALCIUM 9.1 8.7* 8.7*   LFT Recent Labs    11/27/17 0630  PROT 6.5  ALBUMIN 3.3*  AST 25  ALT 18  ALKPHOS 63  BILITOT 1.1  BILIDIR 0.2  IBILI 0.9   PT/INR No results for input(s): LABPROT, INR in the last 72 hours.  Studies/Results: No results found.  Impression: 1.  Mild pharyngeal dysphagia by bedside swallowing evaluation 2.  Esophageal dysphagia by history, with recurrent transient food impactions 3.  Radiographically confirmed Schatzki's ring and mild esophageal dysmotility (probably appropriate for age), without impediment to passage of barium tablet 4.  Right-sided pneumonia, etiology unclear but question of aspiration is present  Discussion: It is unclear whether this patient's intermittent episodes of solid food dysphagia are leading to regurgitation which could put him at risk for aspiration and pneumonia  Plan: 1.  In view of the patient's age, frailty, and current pneumonia, I would recommend taking a conservative approach toward endoscopic evaluation or intervention 2.  In particular, the patient is a marginal candidate for esophageal dilatation of his Schatzki's ring, considering that the barium tablet passed without delay, considering that there is some uncertainty whether his pneumonia is related to his esophageal dysphagia, and considering that his episodes of dysphagia are  generally fairly infrequent, perhaps twice a month.  The indication is not extremely firm for doing dilatation, and in the unlikely event of a complication such as esophageal perforation, it would likely be a very serious and perhaps fatal situation for the patient 3.  Frequently, patients will have improvement of her dysphagia symptoms with intensification of antireflux therapy, so I will recommend that this patient go home on Protonix (in place of omeprazole), 40 mg  TWICE a day. 4.  I have given the patient's wife my card, and have recommended to the patient, his wife, and his daughter, Shaune Pollack, PA-C that they call our office early next week to make an appointment with the patient's primary gastroenterologist, Dr. Leonie Douglas, for approximately a month from now.  That should give enough time to see if the high-dose Protonix is enough to improve his swallowing symptoms to the point where dilatation is not needed, as well hopefully be the case.  They are to call in the meantime if there are significant problems. 5.  I have also instructed the patient to bend his head forward slightly when trying to swallow pills, and that he chop his food into very small pieces, interspersed with swigs of liquid, when eating.  This also will help prevent food impaction episodes. 6.  I will sign off, but please feel free to call me if you have questions.  Time at the bedside for this consultation was approximately 30 minutes, more than 50% of which was spent on counseling  and education.   LOS: 2 days   BUCCINI,ROBERT V  11/29/2017, 3:49 PM   Pager [phone] If no answer or after 5 PM call [phone]

## 2017-11-29 NOTE — Progress Notes (Signed)
Have added FloraStor to pt's regimen to help prevent C. Diff infection from antbx exposure while in hospital.  Cleotis Nipper, M.D. Pager [phone] If no answer or after 5 PM call [phone]

## 2017-11-29 NOTE — Progress Notes (Signed)
We have been asked to see patient for consultation regarding dysphagia.  I have reviewed record and BaS results.  I will plan to see patient this admission.  However, due to an exceptionally busy day at [facility], it may be quite late today or even tomorrow before I am able to see patient, barring a GI emergency developing.  In the meantime, call me if any questions.    I agree w/ PPI therapy, which often helps with dysphagia symptoms.  Cleotis Nipper, M.D. Pager [phone] If no answer or after 5 PM call [phone]

## 2017-11-30 DIAGNOSIS — E039 Hypothyroidism, unspecified: Secondary | ICD-10-CM

## 2017-11-30 DIAGNOSIS — R338 Other retention of urine: Secondary | ICD-10-CM

## 2017-11-30 DIAGNOSIS — J690 Pneumonitis due to inhalation of food and vomit: Principal | ICD-10-CM

## 2017-11-30 DIAGNOSIS — N401 Enlarged prostate with lower urinary tract symptoms: Secondary | ICD-10-CM

## 2017-11-30 DIAGNOSIS — I739 Peripheral vascular disease, unspecified: Secondary | ICD-10-CM

## 2017-11-30 DIAGNOSIS — F0390 Unspecified dementia without behavioral disturbance: Secondary | ICD-10-CM

## 2017-11-30 DIAGNOSIS — E1159 Type 2 diabetes mellitus with other circulatory complications: Secondary | ICD-10-CM

## 2017-11-30 DIAGNOSIS — N400 Enlarged prostate without lower urinary tract symptoms: Secondary | ICD-10-CM

## 2017-11-30 HISTORY — DX: Unspecified dementia, unspecified severity, without behavioral disturbance, psychotic disturbance, mood disturbance, and anxiety: F03.90

## 2017-11-30 LAB — GLUCOSE, CAPILLARY
Glucose-Capillary: 222 mg/dL — ABNORMAL HIGH (ref 65–99)
Glucose-Capillary: 216 mg/dL — ABNORMAL HIGH (ref 65–99)

## 2017-11-30 MED ORDER — DOXAZOSIN MESYLATE 1 MG PO TABS: 1.0000 mg | ORAL_TABLET | Freq: Every day | ORAL | 0 refills | Status: AC

## 2017-11-30 MED ORDER — PANTOPRAZOLE SODIUM 40 MG PO TBEC: 40.0000 mg | DELAYED_RELEASE_TABLET | Freq: Two times a day (BID) | ORAL | 0 refills | Status: AC

## 2017-11-30 MED ORDER — AMOXICILLIN-POT CLAVULANATE 875-125 MG PO TABS: 1.0000 | ORAL_TABLET | Freq: Two times a day (BID) | ORAL | 0 refills | Status: AC

## 2017-11-30 MED ORDER — SACCHAROMYCES BOULARDII 250 MG PO CAPS: 250.0000 mg | ORAL_CAPSULE | Freq: Two times a day (BID) | ORAL | 0 refills | Status: AC

## 2017-11-30 NOTE — Progress Notes (Signed)
Discharge instructions and medications discussed with patient and family.  AVS given to patient.  All questions answered.

## 2017-11-30 NOTE — Care Management Note (Signed)
Case Management Note  Patient Details  Name: [name] [name] MRN: [id number] Date of Birth: 09-Jul-1932  Subjective/Objective:  HCAP, urinary retention                  Action/Plan: NCM spoke to pt, wife and daughter at bedside. Offered choice for HH/list provided. Dtr requesting [facility] for [facility]. Requesting Rollator and 3n1 bedside commode for home. Contacted [facility] with new referral for DME and HH.   Expected Discharge Date: 11/30/2017               Expected Discharge Plan:  [place]  In-House Referral:  NA  Discharge planning Services  CM Consult  Post Acute Care Choice:  Home Health Choice offered to:  Patient  DME Arranged:  [place] rolling with seat DME Agency:  [place] Arranged:  RN [facility] Agency:  [place]  Status of Service:  Completed, signed off  If discussed at [place] of Stay Meetings, dates discussed:    Additional Comments:  Erenest Rasher, RN 11/30/2017, 11:39 AM

## 2017-11-30 NOTE — Discharge Summary (Signed)
Physician Discharge Summary  [name] [name] QXI:[id number] DOB: [birth year] DOA: 11/26/2017  PCP: [name], MD  Admit date: 11/26/2017 Discharge date: 11/30/2017  Admitted From: home Disposition:  home   Recommendations for Outpatient Follow-up:  1. CXR in 2-3 wk to ensure clearing 2. F/u glucose when acute infection resolves. I feel he may need more medication for his diabetes. I have asked them to keep a log of sugars  3. GI f/u in 1 month- BID Protonix until then 4. Chopped food   Home Health:  ordered    Discharge Condition:  stable   CODE STATUS:  Intubate but no other resusitation Consultations:  [facility]    Discharge Diagnoses:  Principal Problem: Aspiration pneumonia Active Problems: BPH/ Urinary retention  Esophageal narrowing / vomiting   PAD (peripheral artery disease) (HCC)   Aneurysm of abdominal vessel (HCC)   Hyperlipidemia   Essential hypertension   Type 2 diabetes mellitus with vascular disease ([place])   Hypothyroidism   Dementia     Brief Summary: [name] [name] 84 y/o with CVA, DM2, AAA, HLD, HTN, Hypothyroid presents for fever 102 via EMS and 103 in ED.Seen in ED on 5/29 and found to have pneumonia and discharged with Augmentin and doxycycline but comes back for continued fevers. Also had dyspnea, cough, dark urine, increased urination, elevated CBGs. CXR> RLL infiltrate U retention- 1000 cc after foley placed  He was last admitted from 3/12 through 3/13 for a CVA. He has a loop recorder.      Hospital Course:  Aspiration pneumonia - RLL infiltrate Regurgitation/ vomiting episodes Distal Esophageal narrowing  - mostly no signs or symptoms other than fevers and coarse crackles in RLL - 5/30> Esophagram:"Distal esophageal mucosal ring allowed a 13 mm barium tablet did pass and thus is at least 13 mm in diameter. However, using fluids I was only able to distend this ring up to about 11 mm in diameter."- see full report  below - possibly aspiration based on above esophagram-  Narrowing may be related to GERD - strep pneumo neg- resp virus panel neg - patients wife states he has a hiatal hernia but is not sure how it was diagnosed or who told them this- he has never had an EGD but has had a colonoscopy -  Fever curve improving- cont Zosyn  - 6/1- d/c IVF- eating chopped food without vomiting now- GI has seen him in the hospital will follow up with him as outpt- recommended bid PPI until then - - change Zosyn to Augmentin - 6/2- - fever resolved- ambulating down hall - no need for O2-  -  ok to d/c home- give Augmentin for total of 14 days as fevers were quite high and continued for a number of days- will need a repeat CXR in 2-3 wks- he has no O2 requirement  Active Problems:  Urinary retention - 1 L removed after foley placed- he was voiding at home- - 5/30- start Flomax> 5/31 have been told that Flomax cannot be crushed- change to Cardura - voiding well now PVR- 8 - per family, he follows with [facility] and been tried on 2 medications for urinary issues in the past  H/o CVA / Hyperlipidemia - 09/09/17- trouble with word finding - cont statin-aspirin and plavix - has loop recorder  Aneurysm of abdominal vessel S/p repair  Type 2 diabetes mellitus with vascular disease ([place]) - Metformin - sugars have been persistently in the 200s- f/u as outpt  Hypothyroidism - synthroid  Hypokalemia - replace  Thrombocytopenia - noted to have mildly low platelets when admitted last year for a CVA  Dementia - most information is obtained from his wife and his daughter  Discharge Exam: Vitals:   11/29/17 2015 11/30/17 0424  BP: (!) 144/74 (!) 143/72  Pulse: 84 93  Resp:    Temp: 98.4 F (36.9 C) 98.4 F (36.9 C)  SpO2: 96% 95%   Vitals:   11/29/17 1628 11/29/17 2015 11/30/17 0424 11/30/17 0427  BP: (!) 148/83 (!) 144/74 (!) 143/72   Pulse: 78 84 93   Resp: 18     Temp:  97.9 F (36.6 C) 98.4 F (36.9 C) 98.4 F (36.9 C)   TempSrc: Oral Oral Oral   SpO2:  96% 95%   Weight:    58.5 kg (128 lb 15.5 oz)    General: Pt is alert, awake, not in acute distress Cardiovascular: RRR, S1/S2 +, no rubs, no gallops Respiratory: crackles in RLL Abdominal: Soft, NT, ND, bowel sounds + Extremities: no edema, no cyanosis   Discharge Instructions  Discharge Instructions    Diet - low sodium heart healthy   Complete by:  As directed    Diet Carb Modified   Complete by:  As directed    Discharge instructions   Complete by:  As directed    Please check your sugars at least 3 x day prior to meals and take record to your PCP next week.   You were cared for by a hospitalist during your hospital stay. If you have any questions about your discharge medications or the care you received while you were in the hospital after you are discharged, you can call the unit and asked to speak with the hospitalist on call if the hospitalist that took care of you is not available. Once you are discharged, your primary care physician will handle any further medical issues. Please note that NO REFILLS for any discharge medications will be authorized once you are discharged, as it is imperative that you return to your primary care physician (or establish a relationship with a primary care physician if you do not have one) for your aftercare needs so that they can reassess your need for medications and monitor your lab values.  Please take all your medications with you for your next visit with your Primary MD. Please ask your Primary MD to get all Hospital records sent to his/her office. Please request your Primary MD to go over all hospital test results at the follow up.   If you experience worsening of your admission symptoms, develop shortness of breath, chest pain, suicidal or homicidal thoughts or a life threatening emergency, you must seek medical attention immediately by calling 911 or  calling your MD.  Dennis Bast must read the complete instructions/literature along with all the possible adverse reactions/side effects for all the medicines you take including new medications that have been prescribed to you. Take new medicines after you have completely understood and accpet all the possible adverse reactions/side effects.   Do not drive when taking pain medications or sedatives.    Do not take more than prescribed Pain, Sleep and Anxiety Medications  If you have smoked or chewed Tobacco in the last 2 yrs please stop. Stop any regular alcohol and or recreational drug use.  Wear Seat belts while driving.   Increase activity slowly   Complete by:  As directed      Allergies as of 11/30/2017      Reactions  Actos [pioglitazone] Swelling   Allegra [fexofenadine] Other (See Comments)   stimulant   Iodine Swelling, Other (See Comments)   DEADLY SICK   Metformin And Related Diarrhea   Pt states that he has a reaction to the extended release metformin but takes the regular   Onglyza [saxagliptin] Other (See Comments)   Weight loss   Shellfish Allergy    DEADLY SICK      Medication List    STOP taking these medications   aspirin 325 MG tablet   doxycycline 100 MG capsule Commonly known as:  VIBRAMYCIN   omeprazole 20 MG capsule Commonly known as:  PRILOSEC Replaced by:  pantoprazole 40 MG tablet   OVER THE COUNTER MEDICATION     TAKE these medications   amoxicillin-clavulanate 875-125 MG tablet Commonly known as:  AUGMENTIN Take 1 tablet by mouth 2 (two) times daily for 10 days. One po bid x 7 days   cholecalciferol 1000 units tablet Commonly known as:  VITAMIN D Take 1,000 Units by mouth daily with supper.   clopidogrel 75 MG tablet Commonly known as:  PLAVIX Take 1 tablet (75 mg total) by mouth daily.   CoQ10 100 MG Caps Take 100 mg by mouth daily.   diazepam 5 MG tablet Commonly known as:  VALIUM Take 2.5-5 mg by mouth at bedtime as needed for  anxiety (restless legs).   doxazosin 1 MG tablet Commonly known as:  CARDURA Take 1 tablet (1 mg total) by mouth daily. Start taking on:  12/01/2017   levothyroxine 75 MCG tablet Commonly known as:  SYNTHROID, LEVOTHROID Take 75 mcg by mouth daily before breakfast.   loratadine 10 MG tablet Commonly known as:  CLARITIN Take 10 mg by mouth daily.   metFORMIN 500 MG tablet Commonly known as:  GLUCOPHAGE Take 1,000 mg by mouth 2 (two) times daily.   multivitamin with minerals Tabs tablet Take 1 tablet by mouth daily.   Omega-3 1000 MG Caps Take 1,000 mg by mouth daily.   pantoprazole 40 MG tablet Commonly known as:  PROTONIX Take 1 tablet (40 mg total) by mouth 2 (two) times daily before a meal. Replaces:  omeprazole 20 MG capsule   rosuvastatin 10 MG tablet Commonly known as:  CRESTOR Take 10 mg by mouth at bedtime.   saccharomyces boulardii 250 MG capsule Commonly known as:  FLORASTOR Take 1 capsule (250 mg total) by mouth 2 (two) times daily.   sildenafil 20 MG tablet Commonly known as:  REVATIO Take 40-100 mg by mouth as needed (erectile dysfunction). For sexual activity   vitamin B-12 1000 MCG tablet Commonly known as:  CYANOCOBALAMIN Take 1,000 mcg by mouth daily.            Durable Medical Equipment  (From admission, onward)        Start     Ordered   11/30/17 0854  For home use only DME 3 n 1  Once     11/30/17 0853   11/29/17 1607  For home use only DME 4 wheeled rolling walker with seat  Once    Question:  Patient needs a walker to treat with the following condition  Answer:  Physical deconditioning   11/29/17 1606   11/29/17 1607  For home use only DME Bedside commode  Once    Question:  Patient needs a bedside commode to treat with the following condition  Answer:  Physical deconditioning   11/29/17 1606     Follow-up Information  [facility] Follow up.   Specialty:  Home Health Services Why:  Home Health Physical  Therapy- agency will call to arrange initial appointment Contact information: [place] [address] [phone]          Allergies  Allergen Reactions  . Actos [Pioglitazone] Swelling  . Allegra [Fexofenadine] Other (See Comments)    stimulant  . Iodine Swelling and Other (See Comments)    DEADLY SICK  . Metformin And Related Diarrhea    Pt states that he has a reaction to the extended release metformin but takes the regular  . Onglyza [Saxagliptin] Other (See Comments)    Weight loss  . Shellfish Allergy     DEADLY SICK     Procedures/Studies:  Dg Chest 2 View  Result Date: 11/26/2017 CLINICAL DATA:  Weakness and fevers EXAM: CHEST - 2 VIEW COMPARISON:  02/04/2011 FINDINGS: Cardiac shadow is within normal limits. Loop recorder is noted. Some patchy atelectatic changes are noted in the right lower lobe laterally. No sizable effusion is seen. No focal confluent infiltrate is noted. No bony abnormality is seen. IMPRESSION: Patchy changes in the right lung base as described. This may represent some early infiltrate. Electronically Signed   By: Inez Catalina M.D.   On: 11/26/2017 09:10   Ct Head Wo Contrast  Result Date: 11/27/2017 CLINICAL DATA:  Status post fall, with small skin tear at the top of the head. Altered level of consciousness and fever. EXAM: CT HEAD WITHOUT CONTRAST TECHNIQUE: Contiguous axial images were obtained from the base of the skull through the vertex without intravenous contrast. COMPARISON:  CT of the head performed 09/08/2017, and MRI of the brain performed 09/09/2017 FINDINGS: Brain: No evidence of acute infarction, hemorrhage, hydrocephalus, extra-axial collection or mass lesion / mass effect. Prominence of the ventricles and sulci reflects mild to moderate cortical volume loss. Chronic lacunar infarcts are noted at the anterior left basal ganglia. Cerebellar atrophy is noted. Scattered periventricular and subcortical white matter change  likely reflects small vessel ischemic microangiopathy. The brainstem and fourth ventricle are within normal limits. The cerebral hemispheres demonstrate grossly normal gray-white differentiation. No mass effect or midline shift is seen. Vascular: No hyperdense vessel or unexpected calcification. Skull: There is no evidence of fracture; visualized osseous structures are unremarkable in appearance. Sinuses/Orbits: Mild bilateral optic drusen are noted. The orbits are otherwise unremarkable. The paranasal sinuses and mastoid air cells are well-aerated. Other: No significant soft tissue abnormalities are seen. IMPRESSION: 1. No evidence of traumatic intracranial injury or fracture. 2. Mild to moderate cortical volume loss and scattered small vessel ischemic microangiopathy. 3. Chronic lacunar infarcts at the anterior left basal ganglia. 4. Mild bilateral optic drusen noted. Electronically Signed   By: Garald Balding M.D.   On: 11/27/2017 02:08   Dg Esophagus  Result Date: 11/27/2017 CLINICAL DATA:  Vomiting.  Hiatal hernia. EXAM: ESOPHOGRAM/BARIUM SWALLOW TECHNIQUE: Single contrast examination was performed using  thin barium. FLUOROSCOPY TIME:  Fluoroscopy Time:  1 minutes 12 seconds Radiation Exposure Index (if provided by the fluoroscopic device): 8.1 mGy Number of Acquired Spot Images: 0 COMPARISON:  None. FINDINGS: The patient was not able to stand or sit up well. We performed the exam with the patient in the left posterior oblique position. There are occasional secondary and tertiary spasms in the esophagus. Primary peristaltic waves were intact on 3/4 swallows, within normal limits. Mild distal esophageal fold thickening is observed. There is a distal esophageal mucosal ring (Schatzki's ring)  which I was not able to distend beyond about 11 mm in diameter using the liquids. However, a 13 mm barium tablet passed without difficulty into the stomach and beyond this Schatzki's ring, and accordingly the radius at  least 13 mm in diameter when challenged with a solid. This is of a size to potentially cause symptoms. Loop recorder noted. The patient did have some coughing after drinking water to help swallow the pill, but no aspiration was observed with barium. IMPRESSION: 1. Distal esophageal mucosal ring allowed a 13 mm barium tablet did pass and thus is at least 13 mm in diameter. However, using fluids I was only able to distend this ring up to about 11 mm in diameter. This ring is of a size to potentially cause food distended. 2. Mild distal esophageal fold thickening, this can be seen in the setting of esophagitis. No ulcer identified. Primary peristaltic waves were preserved. Electronically Signed   By: Van Clines M.D.   On: 11/27/2017 14:23     The results of significant diagnostics from this hospitalization (including imaging, microbiology, ancillary and laboratory) are listed below for reference.     Microbiology: Recent Results (from the past 240 hour(s))  Culture, blood (routine x 2)     Status: None (Preliminary result)   Collection Time: 11/27/17 12:54 AM  Result Value Ref Range Status   Specimen Description   Final    BLOOD LEFT HAND Performed at [facility] [address], [place], [place] [address]    Special Requests   Final    BOTTLES DRAWN AEROBIC AND ANAEROBIC Blood Culture adequate volume Performed at [facility] [address], [place], [place] [address]    Culture   Final    NO GROWTH 3 DAYS Performed at [facility], [place] [address] [place], [place] [address]    Report Status PENDING  Incomplete  Culture, blood (routine x 2)     Status: None (Preliminary result)   Collection Time: 11/27/17 12:54 AM  Result Value Ref Range Status   Specimen Description   Final    BLOOD LEFT FOREARM Performed at [facility], [place] [address] [place], [place] [address]    Special Requests   Final    BOTTLES DRAWN AEROBIC AND  ANAEROBIC Blood Culture adequate volume Performed at [facility] [address] [place], [place] [address]    Culture   Final    NO GROWTH 3 DAYS Performed at [facility], [place] [address] [place], [place] [address]    Report Status PENDING  Incomplete  Urine culture     Status: Abnormal   Collection Time: 11/27/17  1:37 AM  Result Value Ref Range Status   Specimen Description   Final    URINE, CLEAN CATCH Performed at [facility], [place] [address], [place], [place] [address]    Special Requests   Final    NONE Performed at [facility], [place] [address], [place], [place] [address]    Culture (A)  Final    <10,000 COLONIES/mL INSIGNIFICANT GROWTH Performed at [facility] [address], [place],  [address]    Report Status 11/28/2017 FINAL  Final  MRSA PCR Screening     Status: None   Collection Time: 11/27/17  4:30 PM  Result Value Ref Range Status   MRSA by PCR NEGATIVE NEGATIVE Final    Comment:  The GeneXpert MRSA Assay (FDA approved for NASAL specimens only), is one component of a comprehensive MRSA colonization surveillance program. It is not intended to diagnose MRSA infection nor to guide or monitor treatment for MRSA infections. Performed at [facility], [place] [address], [place], [place] [address]   Respiratory Panel by PCR     Status: None   Collection Time: 11/28/17  7:49 AM  Result Value Ref Range Status   Adenovirus NOT DETECTED NOT DETECTED Final   Coronavirus 229E NOT DETECTED NOT DETECTED Final   Coronavirus HKU1 NOT DETECTED NOT DETECTED Final   Coronavirus NL63 NOT DETECTED NOT DETECTED Final   Coronavirus OC43 NOT DETECTED NOT DETECTED Final   Metapneumovirus NOT DETECTED NOT DETECTED Final   Rhinovirus / Enterovirus NOT DETECTED NOT DETECTED Final   Influenza A NOT DETECTED NOT DETECTED Final   Influenza B NOT DETECTED NOT DETECTED Final    Parainfluenza Virus 1 NOT DETECTED NOT DETECTED Final   Parainfluenza Virus 2 NOT DETECTED NOT DETECTED Final   Parainfluenza Virus 3 NOT DETECTED NOT DETECTED Final   Parainfluenza Virus 4 NOT DETECTED NOT DETECTED Final   Respiratory Syncytial Virus NOT DETECTED NOT DETECTED Final   Bordetella pertussis NOT DETECTED NOT DETECTED Final   Chlamydophila pneumoniae NOT DETECTED NOT DETECTED Final   Mycoplasma pneumoniae NOT DETECTED NOT DETECTED Final     Labs: BNP (last 3 results) No results for input(s): BNP in the last 8760 hours. Basic Metabolic Panel: Recent Labs  Lab 11/26/17 0825 11/27/17 0054 11/27/17 0630 11/28/17 0638  NA 136 135 136 138  K 4.1 3.8 3.7 3.4*  CL 99* 104 106 108  CO2 25 21* 18* 21*  GLUCOSE 214* 331* 255* 244*  BUN 16 16 13 13   CREATININE 1.12 1.06 0.93 0.98  CALCIUM 9.7 9.1 8.7* 8.7*  MG  --   --   --  1.4*   Liver Function Tests: Recent Labs  Lab 11/26/17 0825 11/27/17 0054 11/27/17 0630  AST 19 21 25   ALT 15* 17 18  ALKPHOS 67 66 63  BILITOT 1.1 0.6 1.1  PROT 7.4 6.7 6.5  ALBUMIN 3.6 3.6 3.3*   Recent Labs  Lab 11/27/17 0630  LIPASE 20   No results for input(s): AMMONIA in the last 168 hours. CBC: Recent Labs  Lab 11/26/17 0825 11/27/17 0054 11/27/17 0630 11/28/17 0638  WBC 10.5 9.6 8.9 5.6  NEUTROABS 8.4* 8.1* 7.4  --   HGB 15.1 13.6 14.1 12.9*  HCT 44.0 38.4* 39.5 36.4*  MCV 93.2 93.0 93.2 92.2  PLT 156 128* 127* 143*   Cardiac Enzymes: Recent Labs  Lab 11/27/17 0054 11/27/17 0630  TROPONINI <0.03 <0.03   BNP: Invalid input(s): POCBNP CBG: Recent Labs  Lab 11/29/17 1259 11/29/17 1718 11/29/17 2100 11/30/17 0742 11/30/17 1148  GLUCAP 261* 239* 215* 222* 216*   D-Dimer No results for input(s): DDIMER in the last 72 hours. Hgb A1c No results for input(s): HGBA1C in the last 72 hours. Lipid Profile No results for input(s): CHOL, HDL, LDLCALC, TRIG, CHOLHDL, LDLDIRECT in the last 72 hours. Thyroid function  studies No results for input(s): TSH, T4TOTAL, T3FREE, THYROIDAB in the last 72 hours.  Invalid input(s): FREET3 Anemia work up No results for input(s): VITAMINB12, FOLATE, FERRITIN, TIBC, IRON, RETICCTPCT in the last 72 hours. Urinalysis    Component Value Date/Time   COLORURINE YELLOW 11/27/2017 [place] 11/27/2017 0137   LABSPEC 1.016 11/27/2017 [place]  6.0 11/27/2017 0137   GLUCOSEU >=500 (A) 11/27/2017 0137   HGBUR SMALL (A) 11/27/2017 0137   BILIRUBINUR NEGATIVE 11/27/2017 0137   KETONESUR 20 (A) 11/27/2017 0137   PROTEINUR 30 (A) 11/27/2017 0137   NITRITE NEGATIVE 11/27/2017 0137   LEUKOCYTESUR NEGATIVE 11/27/2017 0137   Sepsis Labs Invalid input(s): PROCALCITONIN,  WBC,  LACTICIDVEN Microbiology Recent Results (from the past 240 hour(s))  Culture, blood (routine x 2)     Status: None (Preliminary result)   Collection Time: 11/27/17 12:54 AM  Result Value Ref Range Status   Specimen Description   Final    BLOOD LEFT HAND Performed at [facility], [place] [address], [place], [place] [address]    Special Requests   Final    BOTTLES DRAWN AEROBIC AND ANAEROBIC Blood Culture adequate volume Performed at [facility] [address] [place], [place] [address]    Culture   Final    NO GROWTH 3 DAYS Performed at [facility], [place] [address], [place], [place] [address]    Report Status PENDING  Incomplete  Culture, blood (routine x 2)     Status: None (Preliminary result)   Collection Time: 11/27/17 12:54 AM  Result Value Ref Range Status   Specimen Description   Final    BLOOD LEFT FOREARM Performed at [facility], [place] [address], [place], [place] [address]    Special Requests   Final    BOTTLES DRAWN AEROBIC AND ANAEROBIC Blood Culture adequate volume Performed at [facility] [address] [place], [place] [address]    Culture   Final    NO GROWTH 3 DAYS Performed at  [facility], [place] [address], [place], [place] [address]    Report Status PENDING  Incomplete  Urine culture     Status: Abnormal   Collection Time: 11/27/17  1:37 AM  Result Value Ref Range Status   Specimen Description   Final    URINE, CLEAN CATCH Performed at [facility], [place] [address] [place], [place] [address]    Special Requests   Final    NONE Performed at [facility], [place] [address], [place], [place] [address]    Culture (A)  Final    <10,000 COLONIES/mL INSIGNIFICANT GROWTH Performed at [place] [place] [address], [place], [place] [address]    Report Status 11/28/2017 FINAL  Final  MRSA PCR Screening     Status: None   Collection Time: 11/27/17  4:30 PM  Result Value Ref Range Status   MRSA by PCR NEGATIVE NEGATIVE Final    Comment:        The GeneXpert MRSA Assay (FDA approved for NASAL specimens only), is one component of a comprehensive MRSA colonization surveillance program. It is not intended to diagnose MRSA infection nor to guide or monitor treatment for MRSA infections. Performed at [facility], [place] [address], [place],  [address]   Respiratory Panel by PCR     Status: None   Collection Time: 11/28/17  7:49 AM  Result Value Ref Range Status   Adenovirus NOT DETECTED NOT DETECTED Final   Coronavirus 229E NOT DETECTED NOT DETECTED Final   Coronavirus HKU1 NOT DETECTED NOT DETECTED Final   Coronavirus NL63 NOT DETECTED NOT DETECTED Final   Coronavirus OC43 NOT DETECTED NOT DETECTED Final   Metapneumovirus NOT DETECTED NOT DETECTED Final   Rhinovirus / Enterovirus NOT DETECTED NOT  DETECTED Final   Influenza A NOT DETECTED NOT DETECTED Final   Influenza B NOT DETECTED NOT DETECTED Final   Parainfluenza Virus 1 NOT DETECTED NOT DETECTED Final   Parainfluenza Virus 2 NOT DETECTED NOT DETECTED Final   Parainfluenza Virus 3 NOT DETECTED NOT DETECTED Final   Parainfluenza Virus  4 NOT DETECTED NOT DETECTED Final   Respiratory Syncytial Virus NOT DETECTED NOT DETECTED Final   Bordetella pertussis NOT DETECTED NOT DETECTED Final   Chlamydophila pneumoniae NOT DETECTED NOT DETECTED Final   Mycoplasma pneumoniae NOT DETECTED NOT DETECTED Final     Time coordinating discharge in minutes: 23  SIGNED:   Debbe Odea, MD  [facility] 11/30/2017, 2:42 PM Pager   If 7PM-7AM, please contact night-coverage [email or web address] Password [email or web address]

## 2017-12-02 DIAGNOSIS — I69318 Other symptoms and signs involving cognitive functions following cerebral infarction: Secondary | ICD-10-CM | POA: Diagnosis not present

## 2017-12-02 DIAGNOSIS — R4189 Other symptoms and signs involving cognitive functions and awareness: Secondary | ICD-10-CM | POA: Diagnosis not present

## 2017-12-02 DIAGNOSIS — E1151 Type 2 diabetes mellitus with diabetic peripheral angiopathy without gangrene: Secondary | ICD-10-CM | POA: Diagnosis not present

## 2017-12-02 DIAGNOSIS — F0280 Dementia in other diseases classified elsewhere without behavioral disturbance: Secondary | ICD-10-CM | POA: Diagnosis not present

## 2017-12-02 DIAGNOSIS — K224 Dyskinesia of esophagus: Secondary | ICD-10-CM | POA: Diagnosis not present

## 2017-12-02 DIAGNOSIS — K449 Diaphragmatic hernia without obstruction or gangrene: Secondary | ICD-10-CM | POA: Diagnosis not present

## 2017-12-02 DIAGNOSIS — N401 Enlarged prostate with lower urinary tract symptoms: Secondary | ICD-10-CM | POA: Diagnosis not present

## 2017-12-02 DIAGNOSIS — J690 Pneumonitis due to inhalation of food and vomit: Secondary | ICD-10-CM | POA: Diagnosis not present

## 2017-12-02 DIAGNOSIS — I10 Essential (primary) hypertension: Secondary | ICD-10-CM | POA: Diagnosis not present

## 2017-12-02 DIAGNOSIS — E039 Hypothyroidism, unspecified: Secondary | ICD-10-CM | POA: Diagnosis not present

## 2017-12-02 DIAGNOSIS — Z7984 Long term (current) use of oral hypoglycemic drugs: Secondary | ICD-10-CM | POA: Diagnosis not present

## 2017-12-02 DIAGNOSIS — R1314 Dysphagia, pharyngoesophageal phase: Secondary | ICD-10-CM | POA: Diagnosis not present

## 2017-12-02 DIAGNOSIS — E785 Hyperlipidemia, unspecified: Secondary | ICD-10-CM | POA: Diagnosis not present

## 2017-12-02 DIAGNOSIS — H9190 Unspecified hearing loss, unspecified ear: Secondary | ICD-10-CM | POA: Diagnosis not present

## 2017-12-02 DIAGNOSIS — Z7902 Long term (current) use of antithrombotics/antiplatelets: Secondary | ICD-10-CM | POA: Diagnosis not present

## 2017-12-02 DIAGNOSIS — K222 Esophageal obstruction: Secondary | ICD-10-CM | POA: Diagnosis not present

## 2017-12-02 DIAGNOSIS — R338 Other retention of urine: Secondary | ICD-10-CM | POA: Diagnosis not present

## 2017-12-02 DIAGNOSIS — Z95818 Presence of other cardiac implants and grafts: Secondary | ICD-10-CM | POA: Diagnosis not present

## 2017-12-02 LAB — CULTURE, BLOOD (ROUTINE X 2)
Culture: NO GROWTH
Culture: NO GROWTH
Special Requests: ADEQUATE
Special Requests: ADEQUATE

## 2017-12-05 ENCOUNTER — Other Ambulatory Visit: Payer: PPO | Admitting: *Deleted

## 2017-12-05 LAB — CUP PACEART REMOTE DEVICE CHECK
Date Time Interrogation Session: 20190515200838
Implantable Pulse Generator Implant Date: 20190313

## 2017-12-08 ENCOUNTER — Ambulatory Visit: Payer: Self-pay | Admitting: *Deleted

## 2017-12-09 ENCOUNTER — Other Ambulatory Visit: Payer: Self-pay | Admitting: *Deleted

## 2017-12-09 DIAGNOSIS — J690 Pneumonitis due to inhalation of food and vomit: Secondary | ICD-10-CM | POA: Diagnosis not present

## 2017-12-09 DIAGNOSIS — K222 Esophageal obstruction: Secondary | ICD-10-CM | POA: Diagnosis not present

## 2017-12-09 DIAGNOSIS — Z79899 Other long term (current) drug therapy: Secondary | ICD-10-CM | POA: Diagnosis not present

## 2017-12-09 DIAGNOSIS — R339 Retention of urine, unspecified: Secondary | ICD-10-CM | POA: Diagnosis not present

## 2017-12-09 DIAGNOSIS — N390 Urinary tract infection, site not specified: Secondary | ICD-10-CM | POA: Diagnosis not present

## 2017-12-10 NOTE — Patient Outreach (Signed)
[facility] [facility]) Care Management  [place]  12/05/2017   [name] [name] 1932/10/25 [id number]   Initial telephone outreach/assessment for [employer] Chronic Care Improvement Program  Subjective: Patient's wife Joaquim Lai called her  personal Health Team Advantage concierge and states she wishes to enroll him in the [place] Program for self management assistance with diabetes. Per patient's medical record and wife's report, patient's additional comorbid conditions of previous stroke with swallowing difficulty, hypothyroidism and a recent hospitalization for aspiration pneumonia.  Mr. Gergen gave this RNCM verbal permission via phone to speak with his wife about any and all of his health care issues.  Objective: not applicable  Encounter Medications:  Outpatient Encounter Medications as of 12/05/2017  Medication Sig Note  . amoxicillin-clavulanate (AUGMENTIN) 875-125 MG tablet Take 1 tablet by mouth 2 (two) times daily for 10 days. One po bid x 7 days   . cholecalciferol (VITAMIN D) 1000 units tablet Take 1,000 Units by mouth daily with supper.   . clopidogrel (PLAVIX) 75 MG tablet Take 1 tablet (75 mg total) by mouth daily.   . Coenzyme Q10 (COQ10) 100 MG CAPS Take 100 mg by mouth daily.   . diazepam (VALIUM) 5 MG tablet Take 2.5-5 mg by mouth at bedtime as needed for anxiety (restless legs). 12/05/2017: Takes very rarely  . doxazosin (CARDURA) 1 MG tablet Take 1 tablet (1 mg total) by mouth daily. 12/05/2017: For BPH  . levothyroxine (SYNTHROID, LEVOTHROID) 75 MCG tablet Take 75 mcg by mouth daily before breakfast.    . loratadine (CLARITIN) 10 MG tablet Take 10 mg by mouth daily.  12/05/2017: Takes at hs  . metFORMIN (GLUCOPHAGE) 500 MG tablet Take 1,000 mg by mouth 2 (two) times daily.   . Multiple Vitamin (MULTIVITAMIN WITH MINERALS) TABS tablet Take 1 tablet by mouth daily.   . Omega-3 1000 MG CAPS Take 1,000 mg by  mouth daily.   . pantoprazole (PROTONIX) 40 MG tablet Take 1 tablet (40 mg total) by mouth 2 (two) times daily before a meal.   . rosuvastatin (CRESTOR) 10 MG tablet Take 10 mg by mouth at bedtime.    . saccharomyces boulardii (FLORASTOR) 250 MG capsule Take 1 capsule (250 mg total) by mouth 2 (two) times daily. 12/05/2017: While taking on antibiotics  . vitamin B-12 (CYANOCOBALAMIN) 1000 MCG tablet Take 1,000 mcg by mouth daily.   . sildenafil (REVATIO) 20 MG tablet Take 40-100 mg by mouth as needed (erectile dysfunction). For sexual activity    No facility-administered encounter medications on file as of 12/05/2017.     Functional Status:  In your present state of health, do you have any difficulty performing the following activities: 12/05/2017 11/27/2017  Hearing? Y Y  Comment bilateral hearing aids bilateral hearing aids  Vision? N N  Difficulty concentrating or making decisions? N N  Walking or climbing stairs? Y Y  Dressing or bathing? Y N  Comment due to recent hospitalization -  Doing errands, shopping? Y N  Comment wife always accompanies him -  Conservation officer, nature and eating ? Y -  Comment due to recent hospitalization -  Using the Toilet? N -  In the past six months, have you accidently leaked urine? N -  Do you have problems with loss of bowel control? N -  Managing your Medications? Y -  Comment wife prepares a medication box for him and ensures adherence -  Managing your Finances? Y -  Comment wife  manages the finances -  Housekeeping or managing your Housekeeping? Y -  Comment wife does this -  Some recent data might be hidden    Fall/Depression Screening: No flowsheet data found. No flowsheet data found.  Assessment: Ryot was enrolled in the  [employer] Chronic Care Improvement Program which will provide ongoing self management assistance with chronic disease states.   Plan:  [facility] CM Care Plan Problem One     Most Recent Value  Care Plan Problem One   Recent 4 day hospitalization related to aspiration pneumonia, with deconditioning related to hospitalization  Role Documenting the Problem One  Care Management Coordinator  Care Plan for Problem One  Active  THN Long Term Goal   Over the next 60 days, the patient will report no hospital readmissions  THN Long Term Goal Start Date  12/05/17  Interventions for Problem One Long Term Goal  reviewed discharge instructions with patient's wife who is the primary caregiver, mailed aspiration pneumonia information to the patient and requested that patient review the material before next month's call,  ensured home health speech therapy was arranged, verified appointment with primary care provider within 10 days of hospital discharge and reinforced importance of keeping appointment  [facility] CM Short Term Goal #1   Patient will report no falls with injury in the next 30 days  THN CM Short Term Goal #1 Start Date  12/05/17  Interventions for Short Term Goal #1  [place] admission packet with home fall prevention checklist included and asked patient to review, ensured home health physical therapy services were initiated and assessed patient's progress with therapy by speaking with wife     [facility] CM Care Plan Problem Two     Most Recent Value  Care Plan Problem Two  Not meeting targets for A1C and blood sugars related to Type II diabetes self management knowledge deficits  Role Documenting the Problem Two  Care Management Coordinator  Care Plan for Problem Two  Active  Interventions for Problem Two Long Term Goal   reviewed self monitored CBGs values, reviewed CBG and A1C target, mailed Emmi information on diabetic meal planning, and handout entitled  "Planning Healthy Meals", reinforced importance of keeping follow up appointment with endocrinologist, Dr Buddy Duty, on 02/16/18   THN Long Term Goal  in the next 60 days, 75% of all self monitored CBGs will meet goal  of <180 and in the next 90 days Hgb A1C will show improvement from 7.8%  THN Long Term Goal Start Date  12/05/17     RNCM to fax today's note to Dr Stephanie Acre. RNCM will contact member by phone monthly and as needed to assist with chronic disease self-management and assess member's progress toward mutually set goals per [employer] Chronic Care Improvement Program guidelines.   Barrington Ellison RN,CCM,CDE [place] Management Coordinator Office Phone [phone] Office Fax [phone]

## 2017-12-15 ENCOUNTER — Ambulatory Visit (INDEPENDENT_AMBULATORY_CARE_PROVIDER_SITE_OTHER): Payer: PPO | Admitting: *Deleted

## 2017-12-15 DIAGNOSIS — I639 Cerebral infarction, unspecified: Secondary | ICD-10-CM | POA: Diagnosis not present

## 2017-12-15 NOTE — Patient Outreach (Signed)
[facility]) Care Management  12/15/2017  [name] [name] [name] [name] 08-17-1932 [id number]  See note of 6/10. Call for 6/11 was canceled.  Barrington Ellison RN,CCM,CDE [place] Management Coordinator Office Phone [phone] Office Fax [phone]

## 2017-12-16 NOTE — Progress Notes (Signed)
Carelink Summary Report / Loop Recorder 

## 2017-12-27 ENCOUNTER — Other Ambulatory Visit: Payer: Self-pay | Admitting: Internal Medicine

## 2017-12-30 ENCOUNTER — Other Ambulatory Visit: Payer: Self-pay | Admitting: *Deleted

## 2017-12-30 ENCOUNTER — Ambulatory Visit
Admission: RE | Admit: 2017-12-30 | Discharge: 2017-12-30 | Disposition: A | Discharge status: Home / Self Care | Payer: PPO | Source: Ambulatory Visit | Attending: Family Medicine | Admitting: Family Medicine

## 2017-12-30 ENCOUNTER — Other Ambulatory Visit: Payer: Self-pay | Admitting: Family Medicine

## 2017-12-30 DIAGNOSIS — J690 Pneumonitis due to inhalation of food and vomit: Secondary | ICD-10-CM

## 2017-12-30 DIAGNOSIS — J189 Pneumonia, unspecified organism: Secondary | ICD-10-CM | POA: Diagnosis not present

## 2017-12-30 NOTE — Patient Outreach (Signed)
[place] [facility]) Care Management  12/30/2017  Daronte Shostak Keadle Oct 22, 1932 [id number]   Called patient's wife Joaquim Lai for monthly telephonic follow up for the [employer] diabetes quality program. Per Llewelyn's request, his wife provide the updates of his clinical status. Dub Mikes states she is leaving the house for a bone scan and wishes to reschedule. She states she will call this RNCM to reschedule later this week.   Barrington Ellison RN,CCM,CDE [place] Management Coordinator Office Phone [phone] Office Fax [phone]

## 2018-01-12 DIAGNOSIS — R1310 Dysphagia, unspecified: Secondary | ICD-10-CM | POA: Diagnosis not present

## 2018-01-12 DIAGNOSIS — J189 Pneumonia, unspecified organism: Secondary | ICD-10-CM | POA: Diagnosis not present

## 2018-01-12 DIAGNOSIS — K222 Esophageal obstruction: Secondary | ICD-10-CM | POA: Diagnosis not present

## 2018-01-16 LAB — CUP PACEART REMOTE DEVICE CHECK
Date Time Interrogation Session: 20190617200754
Implantable Pulse Generator Implant Date: 20190313

## 2018-01-19 ENCOUNTER — Ambulatory Visit (INDEPENDENT_AMBULATORY_CARE_PROVIDER_SITE_OTHER): Payer: PPO | Admitting: *Deleted

## 2018-01-19 DIAGNOSIS — I639 Cerebral infarction, unspecified: Secondary | ICD-10-CM | POA: Diagnosis not present

## 2018-01-19 NOTE — Progress Notes (Signed)
Carelink Summary Report / Loop Recorder 

## 2018-01-22 DIAGNOSIS — E039 Hypothyroidism, unspecified: Secondary | ICD-10-CM | POA: Diagnosis not present

## 2018-01-22 DIAGNOSIS — E119 Type 2 diabetes mellitus without complications: Secondary | ICD-10-CM | POA: Diagnosis not present

## 2018-01-22 DIAGNOSIS — K219 Gastro-esophageal reflux disease without esophagitis: Secondary | ICD-10-CM | POA: Diagnosis not present

## 2018-01-22 DIAGNOSIS — R1310 Dysphagia, unspecified: Secondary | ICD-10-CM | POA: Diagnosis not present

## 2018-01-22 DIAGNOSIS — G4730 Sleep apnea, unspecified: Secondary | ICD-10-CM | POA: Diagnosis not present

## 2018-01-28 DIAGNOSIS — R3914 Feeling of incomplete bladder emptying: Secondary | ICD-10-CM | POA: Diagnosis not present

## 2018-01-28 DIAGNOSIS — R351 Nocturia: Secondary | ICD-10-CM | POA: Diagnosis not present

## 2018-01-28 DIAGNOSIS — N401 Enlarged prostate with lower urinary tract symptoms: Secondary | ICD-10-CM | POA: Diagnosis not present

## 2018-02-03 ENCOUNTER — Other Ambulatory Visit: Payer: Self-pay | Admitting: *Deleted

## 2018-02-04 ENCOUNTER — Other Ambulatory Visit: Payer: Self-pay | Admitting: *Deleted

## 2018-02-04 NOTE — Patient Outreach (Signed)
[place] [facility] Care Management  02/04/2018  [name] [name] Blea 1932-08-09 [id number]   [employer] diabetes quality program assessment call rescheduled for 02/04/18.  Barrington Ellison RN,CCM,CDE [place] Management Coordinator Office Phone [phone] Office Fax [phone]

## 2018-02-05 NOTE — Patient Outreach (Signed)
[place] [facility]) Care Management  [place]  02/04/2018   NERY FRAPPIER [birth year] [id number]   Follow up telephone outreach for [employer] Chronic Care Improvement Program  Subjective: Patient's wife Joaquim Lai provided updated for patient as she normally does with Jerek's permission.  Per patient's medical record and wife's report, patient's additional comorbid conditions of previous stroke with swallowing difficulty, hypothyroidism and a recent hospitalization for aspiration pneumonia.  Mr. Wong gave this RNCM verbal permission via phone to speak with his wife about any and all of his health care issues. Joaquim Lai states [facility] received a good report when he saw Dr Myer Peer on 7/15 and Dr. Oletta Lamas at [facility] gastroenterology on 7/25 for his dysphagia. She says he also saw his urologist last month and he is suggesting he consider percutaneous tibial nerve stimulation (PTNS) but Joaquim Lai is concerned about how the procedure works, if it is effective in treating nocturia and the cost of the copays as he will receive the treatment weekly for several weeks. She says her daughter Marcelline Mates, who is a Engineer, mining was going to research the procedure but has no had time.  States his fasting and premeal blood suagrs vary from 90- low 200s and he will see Dr Buddy Duty on 8/19.  She says his blood sugars have been better since he stopped eating cold cereal mid morning and switced to packaged fruit in light syrup. She is requesting low carbohydrate healthy snack ideas.   Objective: not applicable  Encounter Medications:  Outpatient Encounter Medications as of 02/04/2018  Medication Sig Note  . cholecalciferol (VITAMIN D) 1000 units tablet Take 1,000 Units by mouth daily with supper.   . clopidogrel (PLAVIX) 75 MG tablet Take 1 tablet (75 mg total) by mouth daily.   . Coenzyme Q10 (COQ10) 100 MG CAPS Take 100 mg by mouth daily.   . diazepam (VALIUM) 5 MG tablet Take 2.5-5  mg by mouth at bedtime as needed for anxiety (restless legs). 12/05/2017: Takes very rarely  . doxazosin (CARDURA) 1 MG tablet Take 1 tablet (1 mg total) by mouth daily. 12/05/2017: For BPH  . levothyroxine (SYNTHROID, LEVOTHROID) 75 MCG tablet Take 75 mcg by mouth daily before breakfast.    . loratadine (CLARITIN) 10 MG tablet Take 10 mg by mouth daily.  12/05/2017: Takes at hs  . metFORMIN (GLUCOPHAGE) 500 MG tablet Take 1,000 mg by mouth 2 (two) times daily.   . Multiple Vitamin (MULTIVITAMIN WITH MINERALS) TABS tablet Take 1 tablet by mouth daily.   . Omega-3 1000 MG CAPS Take 1,000 mg by mouth daily.   . pantoprazole (PROTONIX) 40 MG tablet Take 1 tablet (40 mg total) by mouth 2 (two) times daily before a meal.   . rosuvastatin (CRESTOR) 10 MG tablet Take 10 mg by mouth at bedtime.    . vitamin B-12 (CYANOCOBALAMIN) 1000 MCG tablet Take 1,000 mcg by mouth daily.   Mar[place] saccharomyces boulardii (FLORASTOR) 250 MG capsule Take 1 capsule (250 mg total) by mouth 2 (two) times daily. (Patient not taking: Reported on 02/04/2018) 12/05/2017: While taking on antibiotics  . sildenafil (REVATIO) 20 MG tablet Take 40-100 mg by mouth as needed (erectile dysfunction). For sexual activity    No facility-administered encounter medications on file as of 02/04/2018.     Functional Status:  In your present state of health, do you have any difficulty performing the following activities: 02/04/2018 12/05/2017  Hearing? Y Y  Comment - bilateral hearing aids  Vision? N N  Difficulty concentrating or making decisions? N N  Walking or climbing stairs? Y Y  Dressing or bathing? N Y  Comment - due to recent hospitalization  Doing errands, shopping? Y Y  Comment - wife always accompanies him  Conservation officer, nature and eating ? N Y  Comment - due to recent hospitalization  Using the Toilet? N N  In the past six months, have you accidently leaked urine? Y N  Do you have problems with loss of bowel control? N N  Managing your  Medications? N Y  Comment - wife prepares a medication box for him and ensures adherence  Managing your Finances? N Y  Comment - wife manages the Science writer or managing your Housekeeping? N Y  Comment - wife does this  Some recent data might be hidden    Fall/Depression Screening: Fall Risk  12/10/2017  Falls in the past year? Yes  Number falls in past yr: 2 or more  Injury with Fall? No  Risk for fall due to : History of fall(s);Impaired balance/gait;Impaired mobility  Follow up Education provided   [facility] 2/9 Scores 02/04/2018 01/20/2018  PHQ - 2 Score 2 2  PHQ- 9 Score - 5    Assessment: [place] is enrolled in the  [employer] diabetes quality program which provides ongoing self management assistance with chronic disease states.   Plan:  [facility] CM Care Plan Problem One     Most Recent Value  Care Plan Problem One   Patient not meeting targets for A1C as evidenced by most recent A1C= 7.8% and fasting and premeal self monitored blood sugars as evidenced by average of >130 related to diabetes self management knowledge deficit   Role Documenting the Problem One  Care Management Coordinator  Care Plan for Problem One  Active  THN Long Term Goal    In the next 90 day,  50% of all self monitored cbgs will meet goal of <180 and Hgb A1C will show improvement from 7.8% due to improved self management strategies  THN Long Term Goal Start Date  02/04/18  Interventions for Problem One Long Term Goal  reviewed CBG readings and targets, discussed low carbohydrate snacks and mailed examples of snacks to patient, reviewed date and time of endocrinology  appointment, arranged for folllow up call in October      RNCM will contact member by phone quarterly and as needed to assist with chronic disease self-management and assess member's progress toward mutually set goals.   Barrington Ellison RN,CCM,CDE [place] Management Coordinator Office Phone  [phone] Office Fax [phone]

## 2018-02-16 DIAGNOSIS — N182 Chronic kidney disease, stage 2 (mild): Secondary | ICD-10-CM | POA: Diagnosis not present

## 2018-02-16 DIAGNOSIS — E039 Hypothyroidism, unspecified: Secondary | ICD-10-CM | POA: Diagnosis not present

## 2018-02-16 DIAGNOSIS — Z79899 Other long term (current) drug therapy: Secondary | ICD-10-CM | POA: Diagnosis not present

## 2018-02-16 DIAGNOSIS — E1122 Type 2 diabetes mellitus with diabetic chronic kidney disease: Secondary | ICD-10-CM | POA: Diagnosis not present

## 2018-02-17 ENCOUNTER — Other Ambulatory Visit: Payer: Self-pay | Admitting: *Deleted

## 2018-02-17 NOTE — Patient Outreach (Signed)
[facility] [facility]) Care Management  02/17/2018  [name] [name] 1932-07-11 [id number]   [name] [name] saw Dr. Buddy Duty on 02/16/18 for his 6 month diabetes check up. His Hgb A1C = 8.0% with goal of <8.0%. No changes were made to his diabetes treatment plan. Care plan updated to reflect new A1C goal.  Barrington Ellison RN,CCM,CDE [place] Management Coordinator Office Phone [phone] Office Fax [phone]

## 2018-02-19 ENCOUNTER — Ambulatory Visit (INDEPENDENT_AMBULATORY_CARE_PROVIDER_SITE_OTHER): Payer: PPO | Admitting: *Deleted

## 2018-02-19 DIAGNOSIS — I639 Cerebral infarction, unspecified: Secondary | ICD-10-CM | POA: Diagnosis not present

## 2018-02-19 DIAGNOSIS — I63442 Cerebral infarction due to embolism of left cerebellar artery: Secondary | ICD-10-CM

## 2018-02-20 NOTE — Progress Notes (Signed)
Carelink Summary Report / Loop Recorder 

## 2018-03-05 LAB — CUP PACEART REMOTE DEVICE CHECK
Date Time Interrogation Session: 20190720200806
Implantable Pulse Generator Implant Date: 20190313

## 2018-03-06 DIAGNOSIS — Z5181 Encounter for therapeutic drug level monitoring: Secondary | ICD-10-CM | POA: Diagnosis not present

## 2018-03-17 DIAGNOSIS — E119 Type 2 diabetes mellitus without complications: Secondary | ICD-10-CM | POA: Diagnosis not present

## 2018-03-24 ENCOUNTER — Ambulatory Visit (INDEPENDENT_AMBULATORY_CARE_PROVIDER_SITE_OTHER): Payer: PPO | Admitting: *Deleted

## 2018-03-24 DIAGNOSIS — I639 Cerebral infarction, unspecified: Secondary | ICD-10-CM | POA: Diagnosis not present

## 2018-03-24 LAB — CUP PACEART REMOTE DEVICE CHECK
Date Time Interrogation Session: 20190822203522
Implantable Pulse Generator Implant Date: 20190313

## 2018-03-25 NOTE — Progress Notes (Signed)
Carelink Summary Report / Loop Recorder 

## 2018-03-30 LAB — CUP PACEART REMOTE DEVICE CHECK
Date Time Interrogation Session: 20190925001000
Implantable Pulse Generator Implant Date: 20190313

## 2018-04-13 DIAGNOSIS — G2581 Restless legs syndrome: Secondary | ICD-10-CM | POA: Diagnosis not present

## 2018-04-13 DIAGNOSIS — R194 Change in bowel habit: Secondary | ICD-10-CM | POA: Diagnosis not present

## 2018-04-13 DIAGNOSIS — G3184 Mild cognitive impairment, so stated: Secondary | ICD-10-CM | POA: Diagnosis not present

## 2018-04-13 DIAGNOSIS — I639 Cerebral infarction, unspecified: Secondary | ICD-10-CM | POA: Diagnosis not present

## 2018-04-13 DIAGNOSIS — F330 Major depressive disorder, recurrent, mild: Secondary | ICD-10-CM | POA: Diagnosis not present

## 2018-04-17 ENCOUNTER — Other Ambulatory Visit: Payer: Self-pay | Admitting: *Deleted

## 2018-04-17 NOTE — Patient Outreach (Addendum)
[name] [name]) Care Management  04/17/2018  [name] [name] 1933-02-15 [id number]   Spoke with patent and his wife [name] [name] at the Economy and flu shot clinic at [employer] on 10/9.  [name] [name] updated this RNCM on both of their health statuses. [name] [name] advised that [name] [name] was started on losartan for kidney protection by [name] [name]. Reviewed office notes and labs by both [name]. Buddy [name] and [name] [name] and updated [name] [name]'s medication list to include Losartan for kidney protection as he is showing small amounts of protein in his urine and Xanax .25 mg once dilay as needed for depression per [name] [name] [name]. [name] [name] says his fasting blood sugars are consistently below 180.  Will plan to call next month for routine follow up as part of the [employer] diabetes quality program.   [name] Ellison RN,CCM,CDE [place] Management Coordinator Office Phone [phone] Office Fax [phone]

## 2018-04-27 ENCOUNTER — Ambulatory Visit (INDEPENDENT_AMBULATORY_CARE_PROVIDER_SITE_OTHER): Payer: PPO | Admitting: *Deleted

## 2018-04-27 DIAGNOSIS — I63442 Cerebral infarction due to embolism of left cerebellar artery: Secondary | ICD-10-CM

## 2018-04-27 NOTE — Progress Notes (Signed)
Carelink Summary Report / Loop Recorder 

## 2018-05-11 DIAGNOSIS — R1310 Dysphagia, unspecified: Secondary | ICD-10-CM | POA: Diagnosis not present

## 2018-05-11 DIAGNOSIS — R259 Unspecified abnormal involuntary movements: Secondary | ICD-10-CM | POA: Diagnosis not present

## 2018-05-11 DIAGNOSIS — R413 Other amnesia: Secondary | ICD-10-CM | POA: Diagnosis not present

## 2018-05-11 DIAGNOSIS — I7771 Dissection of carotid artery: Secondary | ICD-10-CM | POA: Diagnosis not present

## 2018-05-11 DIAGNOSIS — I63412 Cerebral infarction due to embolism of left middle cerebral artery: Secondary | ICD-10-CM | POA: Diagnosis not present

## 2018-05-12 ENCOUNTER — Other Ambulatory Visit: Payer: Self-pay | Admitting: *Deleted

## 2018-05-12 NOTE — Patient Outreach (Signed)
[place] [facility]) Care Management  05/12/2018  [name] [name] 10-11-1932 [id number]   [name] [name] is a member of the [employer] quality diabetes program. His wife [name] [name] receives HTN self management assistance via the [place] Management Health Coach program. [name] [name] is [name]'s  caregiver as he has some mild cognitive impairment.  Spoke with his wife [name] [name] via phone to schedule routine quarterly follow up telephone assessment. She had not responded to previous e-mail outreach requesting she contact this RNCM to schedule follow up.  [name] [name] states she will call to arrange follow up assessment for both herself and [name]  after she has recovered from having 2 wisdom teeth removed on 05/14/18. Await return call.  Barrington Ellison RN,CCM,CDE [place] Management Coordinator Office Phone [phone] Office Fax [phone]

## 2018-05-13 DIAGNOSIS — R1310 Dysphagia, unspecified: Secondary | ICD-10-CM | POA: Insufficient documentation

## 2018-05-18 ENCOUNTER — Other Ambulatory Visit: Payer: Self-pay | Admitting: Neurology

## 2018-05-18 DIAGNOSIS — I7771 Dissection of carotid artery: Secondary | ICD-10-CM

## 2018-05-20 LAB — CUP PACEART REMOTE DEVICE CHECK
Date Time Interrogation Session: 20191028003627
Implantable Pulse Generator Implant Date: 20190313

## 2018-06-01 ENCOUNTER — Ambulatory Visit (INDEPENDENT_AMBULATORY_CARE_PROVIDER_SITE_OTHER): Payer: PPO

## 2018-06-01 DIAGNOSIS — I63442 Cerebral infarction due to embolism of left cerebellar artery: Secondary | ICD-10-CM

## 2018-06-01 NOTE — Progress Notes (Signed)
Carelink Summary Report / Loop Recorder 

## 2018-06-02 LAB — CUP PACEART REMOTE DEVICE CHECK
Date Time Interrogation Session: 20191130004117
Implantable Pulse Generator Implant Date: 20190313

## 2018-06-08 ENCOUNTER — Other Ambulatory Visit: Payer: Self-pay | Admitting: *Deleted

## 2018-06-08 NOTE — Patient Outreach (Addendum)
[facility]) Care Management  [place]  12/9//2019   [name] [name] June 13, 1933 [id number]   Follow up telephone outreach for [employer] Quality Diabetes Program  Subjective: [name] [name] states he is doing well. Sya shis fasting or premeal blood suagrs still range between 150-170 although he did have anordinary hiogh blood sgar of 249 during hte [employer]. His wife [name] [name] fixed a mediaiotn baox for him so he is adherent with his medicaiotns. He also exercised at the [facility]  For 20 minutes twice weekly. His most recent Hgb A1C was 8.0% on 02/16/18 with a target ot <8.0& per Dr. Buddy Duty, his endocrinologist.  [name] [name] states [name] [name] will have a CT of his carotid arteyt on 06/10/18 to evalute a small diss etion of the right internal carotid artety. She also says he will have a follow up swallowing study in the near future. He is not having swallowing difficulty at present.  He says he remains on Losartan for decreased GFR which Dr Buddy Duty prescribed.   Objective: not applicable  Encounter Medications:  Outpatient Encounter Medications as of 06/08/2018  Medication Sig Note  . ALPRAZolam (XANAX) 0.25 MG tablet Take 0.25 mg by mouth at bedtime as needed for anxiety (1/2 to 1 tablet once daily as needed).   . cholecalciferol (VITAMIN D) 1000 units tablet Take 1,000 Units by mouth daily with supper.   . clopidogrel (PLAVIX) 75 MG tablet Take 1 tablet (75 mg total) by mouth daily.   . Coenzyme Q10 (COQ10) 100 MG CAPS Take 100 mg by mouth daily.   . diazepam (VALIUM) 5 MG tablet Take 2.5-5 mg by mouth at bedtime as needed for anxiety (restless legs). 12/05/2017: Takes very rarely  . doxazosin (CARDURA) 1 MG tablet Take 1 tablet (1 mg total) by mouth daily. 12/05/2017: For BPH  . levothyroxine (SYNTHROID, LEVOTHROID) 75 MCG tablet Take 75 mcg by mouth daily before breakfast.    . loratadine (CLARITIN) 10 MG tablet Take 10 mg by mouth daily.  12/05/2017: Takes at  hs  . losartan (COZAAR) 25 MG tablet Take 25 mg by mouth daily.   . metFORMIN (GLUCOPHAGE) 500 MG tablet Take 1,000 mg by mouth 2 (two) times daily.   . Multiple Vitamin (MULTIVITAMIN WITH MINERALS) TABS tablet Take 1 tablet by mouth daily.   . Omega-3 1000 MG CAPS Take 1,000 mg by mouth daily.   . pantoprazole (PROTONIX) 40 MG tablet Take 1 tablet (40 mg total) by mouth 2 (two) times daily before a meal.   . rosuvastatin (CRESTOR) 10 MG tablet Take 10 mg by mouth at bedtime.    . saccharomyces boulardii (FLORASTOR) 250 MG capsule Take 1 capsule (250 mg total) by mouth 2 (two) times daily. (Patient not taking: Reported on 02/04/2018) 12/05/2017: While taking on antibiotics  . sildenafil (REVATIO) 20 MG tablet Take 40-100 mg by mouth as needed (erectile dysfunction). For sexual activity   . vitamin B-12 (CYANOCOBALAMIN) 1000 MCG tablet Take 1,000 mcg by mouth daily.    No facility-administered encounter medications on file as of 06/08/2018.     Functional Status:  In your present state of health, do you have any difficulty performing the following activities: 02/04/2018 12/05/2017  Hearing? Y Y  Comment - bilateral hearing aids  Vision? N N  Difficulty concentrating or making decisions? N N  Walking or climbing stairs? Y Y  Dressing or bathing? N Y  Comment - due to recent hospitalization  Doing errands, shopping? Tempie Donning  Comment - wife always accompanies him  Preparing Food and eating ? N Y  Comment - due to recent hospitalization  Using the Toilet? N N  In the past six months, have you accidently leaked urine? Y N  Do you have problems with loss of bowel control? N N  Managing your Medications? N Y  Comment - wife prepares a medication box for him and ensures adherence  Managing your Finances? N Y  Comment - wife manages the Science writer or managing your Housekeeping? N Y  Comment - wife does this  Some recent data might be hidden    Fall/Depression Screening: Fall Risk   12/10/2017  Falls in the past year? Yes  Number falls in past yr: 2 or more  Injury with Fall? No  Risk for fall due to : History of fall(s);Impaired balance/gait;Impaired mobility  Follow up Education provided   [facility] 2/9 Scores 02/04/2018 01/20/2018  PHQ - 2 Score 2 2  PHQ- 9 Score - 5    Assessment: [name] [name] is enrolled in the  [employer] diabetes quality program which provides ongoing self management assistance with diabetes.  Plan:  [facility] CM Care Plan Problem One     Most Recent Value  Care Plan Problem One Patient not meeting targets for A1C as evidenced by most recent A1C= 8.0% on 02/16/18 however [name] [name] reports fasting blood sugars have improved with average 150-170   Role Documenting the Problem One  Care Management [place] for Problem One  Active  THN Long Term Goal  In the next 90 days, 90% of all self monitored CBGs will meet goal of <180 and Hgb A1C will show improvement from 8.0% due to improved self management strategies  THN Long Term Goal Start Date 06/08/18  Interventions for Problem One Long Term Goal reviewed CBG readings and targets, discussed low carbohydrate snacks and meals, encouraged [name] [name] to review the low carbohydrate meal and snack planning information that was sent to [name] [name], encouraged him to meet with [name] [name], the Wellness RN  at the [facility]  For onsite assistance with diabetes management,  ensured he is up to date on all diabetes health maintenance screenings, will arrange for quarterly follow up in March 2020      RNCM will route today's note to Dr Stephanie Acre.  RNCM will contact member by phone quarterly and as needed to assist with chronic disease self-management and assess member's progress toward mutually set goals.   Barrington Ellison RN,CCM,CDE [place] Management Coordinator Office Phone [phone] Office Fax [phone]

## 2018-06-10 ENCOUNTER — Inpatient Hospital Stay: Admission: RE | Admit: 2018-06-10 | Payer: PPO | Source: Ambulatory Visit

## 2018-06-15 NOTE — Progress Notes (Signed)
Mr. [name] [name] wife came in to see me about the Provider Referral Exercise Program at the [facility].  I have messaged their PMD for clearance to participate.

## 2018-06-16 ENCOUNTER — Other Ambulatory Visit: Payer: Self-pay | Admitting: *Deleted

## 2018-06-16 DIAGNOSIS — E0822 Diabetes mellitus due to underlying condition with diabetic chronic kidney disease: Secondary | ICD-10-CM

## 2018-06-16 NOTE — Patient Outreach (Signed)
[place] [facility]) Care Management  06/16/2018  [name] [name] 08/25/1932 [id number]  [name] [name] is a member for the [employer] Diabetes Quality Program  Spoke with [name] [name], [name] [name]'s wife,  today via phone to advise this RNCM is transitioning to another job position and that she and  [name] [name] will be assigned another [name] [name]. She voiced appreciation for the help this RNCM has provided. She says [name] [name] has not yet had his swallowing study or head CT that was ordered by his neurologist, because there is a question of his sensitivity to contrast dye.  She also says,  per this RNCM'S suggestion, she and [name] [name] met with [name] [name], the Wellness RN at the [facility] for blood pressure checks and to enroll in the Provider Referral Exercise Program. [name] [name] says the classes will begin on 07/25/18 and they will go every Wednesdays and Fridays for 10 weeks.  She reports that each time [name] [name] has checked their blood pressure, the readings have been good.  Plan: Will make referral to another [place] for ongoing diabetes self management assistance.   Barrington Ellison RN,CCM,CDE [place] Management Coordinator Office Phone [phone] Office Fax [phone]

## 2018-07-02 ENCOUNTER — Ambulatory Visit (INDEPENDENT_AMBULATORY_CARE_PROVIDER_SITE_OTHER): Payer: PPO

## 2018-07-02 DIAGNOSIS — I63442 Cerebral infarction due to embolism of left cerebellar artery: Secondary | ICD-10-CM | POA: Diagnosis not present

## 2018-07-02 LAB — CUP PACEART REMOTE DEVICE CHECK
Date Time Interrogation Session: 20200102010931
Implantable Pulse Generator Implant Date: 20190313

## 2018-07-02 NOTE — Progress Notes (Signed)
Carelink Summary Report / Loop Recorder 

## 2018-07-16 ENCOUNTER — Ambulatory Visit: Payer: Self-pay

## 2018-07-17 ENCOUNTER — Other Ambulatory Visit: Payer: Self-pay

## 2018-07-17 NOTE — Patient Outreach (Signed)
[facility]) Care Management  07/17/2018   [name] [name] June 16, 1933 [id number]  Subjective: Successful outreach to the patient.  HIPAA verified by wife (she is on the consent). Called and spoke with Wife and patient to introduce myself as Engineer, maintenance. The  patient has been doing well.  He denis any pain or falls.  She states that his blood sugars had went up during the holidays but have come down since.  She states that they have been ranging from 150-170.  He is checking his blood sugars daily. He is taking all of his medication.   He is monitoring his diet.  He is starting an exercise program with Vanita Ingles RN at the [facility] on the 29 th of this month.  Current Medications:  Current Outpatient Medications  Medication Sig Dispense Refill  . ALPRAZolam (XANAX) 0.25 MG tablet Take 0.25 mg by mouth at bedtime as needed for anxiety (1/2 to 1 tablet once daily as needed).    . cholecalciferol (VITAMIN D) 1000 units tablet Take 1,000 Units by mouth daily with supper.    . clopidogrel (PLAVIX) 75 MG tablet Take 1 tablet (75 mg total) by mouth daily. 30 tablet 2  . Coenzyme Q10 (COQ10) 100 MG CAPS Take 100 mg by mouth daily.    Marland Kitchen doxazosin (CARDURA) 1 MG tablet Take 1 tablet (1 mg total) by mouth daily. 30 tablet 0  . levothyroxine (SYNTHROID, LEVOTHROID) 75 MCG tablet Take 75 mcg by mouth daily before breakfast.     . loratadine (CLARITIN) 10 MG tablet Take 10 mg by mouth daily.     Marland Kitchen losartan (COZAAR) 25 MG tablet Take 25 mg by mouth daily.    . metFORMIN (GLUCOPHAGE) 500 MG tablet Take 1,000 mg by mouth 2 (two) times daily.  0  . Multiple Vitamin (MULTIVITAMIN WITH MINERALS) TABS tablet Take 1 tablet by mouth daily.    . Omega-3 1000 MG CAPS Take 1,000 mg by mouth daily.    . pantoprazole (PROTONIX) 40 MG tablet Take 1 tablet (40 mg total) by mouth 2 (two) times daily before a meal. 60 tablet 0  . rosuvastatin (CRESTOR) 10 MG tablet Take 10 mg by mouth at bedtime.      . sildenafil (REVATIO) 20 MG tablet Take 40-100 mg by mouth as needed (erectile dysfunction). For sexual activity  4  . vitamin B-12 (CYANOCOBALAMIN) 1000 MCG tablet Take 1,000 mcg by mouth daily.    . diazepam (VALIUM) 5 MG tablet Take 2.5-5 mg by mouth at bedtime as needed for anxiety (restless legs).    . saccharomyces boulardii (FLORASTOR) 250 MG capsule Take 1 capsule (250 mg total) by mouth 2 (two) times daily. (Patient not taking: Reported on 07/17/2018) 30 capsule 0   No current facility-administered medications for this visit.     Functional Status:  In your present state of health, do you have any difficulty performing the following activities: 02/04/2018 12/05/2017  Hearing? Y Y  Comment - bilateral hearing aids  Vision? N N  Difficulty concentrating or making decisions? N N  Walking or climbing stairs? Y Y  Dressing or bathing? N Y  Comment - due to recent hospitalization  Doing errands, shopping? Y Y  Comment - wife always accompanies him  Conservation officer, nature and eating ? N Y  Comment - due to recent hospitalization  Using the Toilet? N N  In the past six months, have you accidently leaked urine? Y N  Do you have problems  with loss of bowel control? N N  Managing your Medications? N Y  Comment - wife prepares a medication box for him and ensures adherence  Managing your Finances? N Y  Comment - wife manages the Science writer or managing your Housekeeping? N Y  Comment - wife does this  Some recent data might be hidden    Fall/Depression Screening: Fall Risk  07/17/2018 12/10/2017  Falls in the past year? 0 Yes  Number falls in past yr: - 2 or more  Injury with Fall? - No  Risk for fall due to : - History of fall(s);Impaired balance/gait;Impaired mobility  Follow up - Education provided   [facility] 2/9 Scores 02/04/2018 01/20/2018  PHQ - 2 Score 2 2  PHQ- 9 Score - 5    Assessment: Patient will continue to benefit from health coach outreach for disease management and  support.  [facility] CM Care Plan Problem One     Most Recent Value  Care Plan Problem One  Patient not meeting targets for A1C as evidenced by most recent A1C= 8.0% on 02/16/18 however [name] [name] reports fasting blood sugars have improved with average 150-170  Role Documenting the Problem One  [place] for Problem One  Active  THN Long Term Goal   In the next 90 days, 90% of all self monitored CBGs will meet goal of <180 and Hgb A1C will show improvement from 8.0% due to improved self management strategies  THN Long Term Goal Start Date  07/17/18  Interventions for Problem One Long Term Goal  Discussed diet, exercise at the [facility] to help improve his blood sugars and medication adherence       Plan: [place] will contact patient in the month of February and patient agrees to next outreach.   Lazaro Arms RN, BSN, [place] Direct Dial:  [phone]  Fax: [phone]

## 2018-07-24 ENCOUNTER — Other Ambulatory Visit (HOSPITAL_COMMUNITY): Payer: Self-pay | Admitting: Neurology

## 2018-07-24 ENCOUNTER — Other Ambulatory Visit (HOSPITAL_COMMUNITY): Payer: Self-pay | Admitting: *Deleted

## 2018-07-24 ENCOUNTER — Other Ambulatory Visit: Payer: Self-pay | Admitting: Neurology

## 2018-07-24 DIAGNOSIS — R1310 Dysphagia, unspecified: Secondary | ICD-10-CM

## 2018-07-24 DIAGNOSIS — I7771 Dissection of carotid artery: Secondary | ICD-10-CM

## 2018-07-27 DIAGNOSIS — I63412 Cerebral infarction due to embolism of left middle cerebral artery: Secondary | ICD-10-CM | POA: Diagnosis not present

## 2018-07-30 ENCOUNTER — Ambulatory Visit (HOSPITAL_COMMUNITY)
Admission: RE | Admit: 2018-07-30 | Discharge: 2018-07-30 | Disposition: A | Discharge status: Home / Self Care | Payer: PPO | Source: Ambulatory Visit | Attending: Neurology | Admitting: Neurology

## 2018-07-30 DIAGNOSIS — I7771 Dissection of carotid artery: Secondary | ICD-10-CM

## 2018-07-30 LAB — POCT I-STAT CREATININE: Creatinine, Ser: 1.3 mg/dL — ABNORMAL HIGH (ref 0.61–1.24)

## 2018-07-30 MED ORDER — IOPAMIDOL (ISOVUE-370) INJECTION 76%
75.0000 mL | Freq: Once | INTRAVENOUS | Status: AC | PRN
  Administered 2018-07-30: 75 mL via INTRAVENOUS

## 2018-08-02 NOTE — Progress Notes (Signed)
[place] Report   Patient Details  Name: [name] [name] MRN: [id number] Date of Birth: 1933/06/15 Age: 85 y.o. PCP: [name], MD  Vitals:   07/28/18 1945  BP: 140/64  Pulse: 75  Resp: 18  SpO2: 98%  Weight: 131 lb 3.2 oz (59.5 kg)     [facility] Eval - 08/02/18 1900      Referral    Referring Provider  dr. Stephanie Acre    Reason for referral  Inactivity;Diabetes    Program Start Date  07/28/18      Measurement   Neck measurement  14 Inches    Waist Circumference  35 inches    Body fat  29.5 percent      Information for Trainer   Goals  "To stay healthy and improve strength"    Current Exercise  sometimes bike and few weight machines    Orthopedic Concerns  bak pain w/standing long periods, restless leg/knee pain    Pertinent Medical History  see chart    Current Barriers  none      Mobility and Daily Activities   I find it easy to walk up or down two or more flights of stairs.  3    I have no trouble taking out the trash.  4    I do housework such as vacuuming and dusting on my own without difficulty.  4    I can easily lift a gallon of milk (8lbs).  4    I can easily walk a mile.  4    I have no trouble reaching into high cupboards or reaching down to pick up something from the floor.  3    I do not have trouble doing out-door work such as Armed forces logistics/support/administrative officer, raking leaves, or gardening.  3      Mobility and Daily Activities   I feel younger than my age.  2    I feel independent.  3    I feel energetic.  2    I live an active life.   3    I feel strong.  1    I feel healthy.  2    I feel active as other people my age.  3      How fit and strong are you.   Fit and Strong Total Score  41      Past Medical History:  Diagnosis Date  . AAA (abdominal aortic aneurysm) ([place])   . 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 Diabetes mellitus without complication ([place])   . Hiatal hernia   . HOH (hard of hearing)   . Hyperlipidemia   . Stroke [facility])     Past Surgical History:  Procedure Laterality Date  . ABDOMINAL AORTIC ANEURYSM REPAIR  10-2003   also had iliac aneurysm repair  . CHOLECYSTECTOMY     Laparoscopic cholecystectomy  . EYE SURGERY Bilateral    cataracts  . HERNIA REPAIR    . LOOP RECORDER INSERTION N/A 09/10/2017   Procedure: LOOP RECORDER INSERTION;  Surgeon: Deboraha Sprang, MD;  Location: [place] [facility];  Service: Cardiovascular;  Laterality: N/A;  . TEE WITHOUT CARDIOVERSION N/A 09/10/2017   Procedure: TRANSESOPHAGEAL ECHOCARDIOGRAM (TEE);  Surgeon: Jerline Pain, MD;  Location: [facility] ENDOSCOPY;  Service: Cardiovascular;  Laterality: N/A;   Social History   Tobacco Use  Smoking Status Never Smoker  Smokeless Tobacco Never Used    Mr. Carswell has been registered and began the 12-week,  twice weekly PREP at the [facility] with his wife.  He has access to all the area Y[facility] at anytime he wishes to exercises besides coming to class.   Vanita Ingles 08/02/2018, 7:54 PM

## 2018-08-03 ENCOUNTER — Ambulatory Visit (INDEPENDENT_AMBULATORY_CARE_PROVIDER_SITE_OTHER): Payer: PPO

## 2018-08-03 DIAGNOSIS — I63442 Cerebral infarction due to embolism of left cerebellar artery: Secondary | ICD-10-CM

## 2018-08-05 NOTE — Progress Notes (Signed)
[place] Report   Patient Details  Name: [name] [name] MRN: [id number] Date of Birth: [birth year] Age: 85 y.o. PCP: [name], MD  Vitals:   08/05/18 2013  Weight: 132 lb 9.6 oz (60.1 kg)      Past Medical History:  Diagnosis Date  . AAA (abdominal aortic aneurysm) ([place])   . 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 Diabetes mellitus without complication ([place])   . Hiatal hernia   . HOH (hard of hearing)   . Hyperlipidemia   . Stroke [facility])    Past Surgical History:  Procedure Laterality Date  . ABDOMINAL AORTIC ANEURYSM REPAIR  10-2003   also had iliac aneurysm repair  . CHOLECYSTECTOMY     Laparoscopic cholecystectomy  . EYE SURGERY Bilateral    cataracts  . HERNIA REPAIR    . LOOP RECORDER INSERTION N/A 09/10/2017   Procedure: LOOP RECORDER INSERTION;  Surgeon: Deboraha Sprang, MD;  Location: [place] [facility];  Service: Cardiovascular;  Laterality: N/A;  . TEE WITHOUT CARDIOVERSION N/A 09/10/2017   Procedure: TRANSESOPHAGEAL ECHOCARDIOGRAM (TEE);  Surgeon: Jerline Pain, MD;  Location: [facility] ENDOSCOPY;  Service: Cardiovascular;  Laterality: N/A;   Social History   Tobacco Use  Smoking Status Never Smoker  Smokeless Tobacco Never Used   Fun things you did since last meeting:"Brother's birthday party"   Vanita Ingles 08/05/2018, 8:14 PM

## 2018-08-06 LAB — CUP PACEART REMOTE DEVICE CHECK
Date Time Interrogation Session: 20200204010612
Implantable Pulse Generator Implant Date: 20190313

## 2018-08-11 NOTE — Progress Notes (Signed)
Carelink Summary Report / Loop Recorder 

## 2018-08-12 NOTE — Progress Notes (Signed)
[facility] PREP Weekly Session   Patient Details  Name: ZAEDEN LASTINGER MRN: [id number] Date of Birth: [birth year] Age: 85 y.o. PCP: [name], MD  Vitals:   08/12/18 1702  Weight: 130 lb 6.4 oz (59.1 kg)    [facility] Weekly seesion - 08/12/18 1700      Weekly Session   Topic Discussed  Healthy eating tips        Vanita Ingles 08/12/2018, 5:02 PM

## 2018-08-13 ENCOUNTER — Ambulatory Visit (HOSPITAL_COMMUNITY)
Admission: RE | Admit: 2018-08-13 | Discharge: 2018-08-13 | Disposition: A | Discharge status: Home / Self Care | Payer: PPO | Source: Ambulatory Visit | Attending: Neurology | Admitting: Neurology

## 2018-08-13 ENCOUNTER — Other Ambulatory Visit: Payer: Self-pay

## 2018-08-13 DIAGNOSIS — I739 Peripheral vascular disease, unspecified: Secondary | ICD-10-CM

## 2018-08-13 DIAGNOSIS — I714 Abdominal aortic aneurysm, without rupture, unspecified: Secondary | ICD-10-CM

## 2018-08-13 DIAGNOSIS — I728 Aneurysm of other specified arteries: Secondary | ICD-10-CM

## 2018-08-13 DIAGNOSIS — R1310 Dysphagia, unspecified: Secondary | ICD-10-CM | POA: Insufficient documentation

## 2018-08-13 NOTE — Progress Notes (Signed)
Modified Barium Swallow Progress Note  Patient Details  Name: [name] [name] MRN: [id number] Date of Birth: 07/09/1932  Today's Date: 08/13/2018  Modified Barium Swallow completed.  Full report located under Chart Review in the Imaging Section.  Brief recommendations include the following:  Clinical Impression  Pt presents with functional swallowing.  There was extremely trace, transient penetration intermittently during swallowing. Penetration cleared spontaneously or with reflexive throat clear.  With thin liquid there was moderate vallecula and pyriform residue which cleared spontaneously with subsequent swallow.  There was static residue in the vallecula with puree and regular consistencies.  Recommend pt continue regular diet with thin liquid.  Pt may benefit from swallowing therapy as an outpatient to improve base of tongue strength.  Introduced Research scientist (medical), however, pt seemed uninterested in pursuing HEP.  Continue regular texture diet (choosing soft foods as needed) and thin liquid.   Swallow Evaluation Recommendations       SLP Diet Recommendations: Regular solids;Thin liquid   Liquid Administration via: Cup;Straw   Medication Administration: Whole meds with liquid   Supervision: Patient able to self feed   Compensations: Slow rate;Follow solids with liquid(prn)   Postural Changes: Remain semi-upright after after feeds/meals (Comment);Seated upright at 90 degrees   Oral Care Recommendations: Oral care BID        Celedonio Savage, [place], [place] Office: [phone]; Pager (2/13): [phone] 08/13/2018,12:28 PM

## 2018-08-14 ENCOUNTER — Other Ambulatory Visit: Payer: Self-pay

## 2018-08-14 DIAGNOSIS — I713 Abdominal aortic aneurysm, ruptured, unspecified: Secondary | ICD-10-CM

## 2018-08-17 ENCOUNTER — Other Ambulatory Visit: Payer: Self-pay

## 2018-08-17 NOTE — Patient Outreach (Addendum)
[facility]) Care Management  08/17/2018  [name] [name] Oct 02, 1932 [id number]    1st attempt to outreach the patient.  The wife answered the phone.  (she is on ROI)  She states that her and her husband were on their way to a dentist appointment and now is not a good time.  She asked if I could call them back at a later time.  Plan:  [place] will make outreach attempt the patient within thirty days.  Lazaro Arms RN, BSN, [place] Direct Dial:  [phone]  Fax: [phone]

## 2018-08-18 ENCOUNTER — Other Ambulatory Visit: Payer: Self-pay

## 2018-08-19 NOTE — Progress Notes (Signed)
[facility] PREP Weekly Session   Patient Details  Name: LONELL STAMOS MRN: [id number] Date of Birth: 03-03-1933 Age: 85 y.o. PCP: [name], MD  Vitals:   08/18/18 2013  Weight: 132 lb 6.4 oz (60.1 kg)    [facility] Weekly seesion - 08/19/18 2000      Weekly Session   Topic Discussed  Health habits        Vanita Ingles 08/19/2018, 8:13 PM

## 2018-08-21 ENCOUNTER — Other Ambulatory Visit: Payer: Self-pay

## 2018-08-21 NOTE — Patient Outreach (Signed)
[facility]) Care Management  08/21/2018   [name] [name] 1932-08-14 [id number]  Subjective: Successful call to the patient.  HIPAA verified by the wife (on consent).  Spoke with the husband and wife on the phone using the speaker.  The patient states that he is doing fine.  He sates that he forgot to take his blood sugar for the last two days but prior to this his blood sugar was 190 and he states he know why it was high. His blood sugars usually range between 160-170.  He states that he plans to make sure that his blood sugars remain under 180.  He is taking his medications as prescribed.  He and his wife started an exercise program four weeks ago and they go on wednesdays and Fridays at 11 am.  He has an appointment with Dr Buddy Duty on March 3rd.  Current Medications:  Current Outpatient Medications  Medication Sig Dispense Refill  . ALPRAZolam (XANAX) 0.25 MG tablet Take 0.25 mg by mouth at bedtime as needed for anxiety (1/2 to 1 tablet once daily as needed).    . cholecalciferol (VITAMIN D) 1000 units tablet Take 1,000 Units by mouth daily with supper.    . clopidogrel (PLAVIX) 75 MG tablet Take 1 tablet (75 mg total) by mouth daily. 30 tablet 2  . Coenzyme Q10 (COQ10) 100 MG CAPS Take 100 mg by mouth daily.    . diazepam (VALIUM) 5 MG tablet Take 2.5-5 mg by mouth at bedtime as needed for anxiety (restless legs).    Marland Kitchen doxazosin (CARDURA) 1 MG tablet Take 1 tablet (1 mg total) by mouth daily. 30 tablet 0  . levothyroxine (SYNTHROID, LEVOTHROID) 75 MCG tablet Take 75 mcg by mouth daily before breakfast.     . loratadine (CLARITIN) 10 MG tablet Take 10 mg by mouth daily.     Marland Kitchen losartan (COZAAR) 25 MG tablet Take 25 mg by mouth daily.    . metFORMIN (GLUCOPHAGE) 500 MG tablet Take 1,000 mg by mouth 2 (two) times daily.  0  . Multiple Vitamin (MULTIVITAMIN WITH MINERALS) TABS tablet Take 1 tablet by mouth daily.    . Omega-3 1000 MG CAPS Take 1,000 mg by mouth daily.    .  pantoprazole (PROTONIX) 40 MG tablet Take 1 tablet (40 mg total) by mouth 2 (two) times daily before a meal. 60 tablet 0  . rosuvastatin (CRESTOR) 10 MG tablet Take 10 mg by mouth at bedtime.     . sildenafil (REVATIO) 20 MG tablet Take 40-100 mg by mouth as needed (erectile dysfunction). For sexual activity  4  . vitamin B-12 (CYANOCOBALAMIN) 1000 MCG tablet Take 1,000 mcg by mouth daily.    Marland Kitchen saccharomyces boulardii (FLORASTOR) 250 MG capsule Take 1 capsule (250 mg total) by mouth 2 (two) times daily. (Patient not taking: Reported on 08/21/2018) 30 capsule 0   No current facility-administered medications for this visit.     Functional Status:  In your present state of health, do you have any difficulty performing the following activities: 02/04/2018 12/05/2017  Hearing? Y Y  Comment - bilateral hearing aids  Vision? N N  Difficulty concentrating or making decisions? N N  Walking or climbing stairs? Y Y  Dressing or bathing? N Y  Comment - due to recent hospitalization  Doing errands, shopping? Y Y  Comment - wife always accompanies him  Conservation officer, nature and eating ? N Y  Comment - due to recent hospitalization  Using the Toilet? N  N  In the past six months, have you accidently leaked urine? Y N  Do you have problems with loss of bowel control? N N  Managing your Medications? N Y  Comment - wife prepares a medication box for him and ensures adherence  Managing your Finances? N Y  Comment - wife manages the Science writer or managing your Housekeeping? N Y  Comment - wife does this  Some recent data might be hidden    Fall/Depression Screening: Fall Risk  08/21/2018 07/17/2018 12/10/2017  Falls in the past year? 0 0 Yes  Number falls in past yr: - - 2 or more  Injury with Fall? - - No  Risk for fall due to : - - History of fall(s);Impaired balance/gait;Impaired mobility  Follow up - - Education provided   [facility] 2/9 Scores 02/04/2018 01/20/2018  PHQ - 2 Score 2 2  PHQ- 9 Score - 5     Assessment: Patient will continue to benefit from health coach outreach for disease management and support,  [facility] CM Care Plan Problem One     Most Recent Value  THN Long Term Goal   In the next 90 days, 90% of all self monitored CBGs will meet goal of <180 and Hgb A1C will show improvement from 8.0% due to improved self management strategies  THN Long Term Goal Start Date  08/21/18  Interventions for Problem One Long Term Goal     Reviewed cbg record,  exercise goals,discussed food and fluid  intake, and discussed medication management     Plan: [place] will contact patient in the month of May and patient agrees to next outreach.   Lazaro Arms RN, BSN, [place] Direct Dial:  [phone]  Fax: [phone]

## 2018-09-01 DIAGNOSIS — E1122 Type 2 diabetes mellitus with diabetic chronic kidney disease: Secondary | ICD-10-CM | POA: Diagnosis not present

## 2018-09-01 DIAGNOSIS — Z79899 Other long term (current) drug therapy: Secondary | ICD-10-CM | POA: Diagnosis not present

## 2018-09-01 DIAGNOSIS — N529 Male erectile dysfunction, unspecified: Secondary | ICD-10-CM | POA: Diagnosis not present

## 2018-09-01 DIAGNOSIS — N183 Chronic kidney disease, stage 3 (moderate): Secondary | ICD-10-CM | POA: Diagnosis not present

## 2018-09-01 DIAGNOSIS — E1165 Type 2 diabetes mellitus with hyperglycemia: Secondary | ICD-10-CM | POA: Diagnosis not present

## 2018-09-01 DIAGNOSIS — E039 Hypothyroidism, unspecified: Secondary | ICD-10-CM | POA: Diagnosis not present

## 2018-09-01 DIAGNOSIS — Z7984 Long term (current) use of oral hypoglycemic drugs: Secondary | ICD-10-CM | POA: Diagnosis not present

## 2018-09-04 ENCOUNTER — Ambulatory Visit (INDEPENDENT_AMBULATORY_CARE_PROVIDER_SITE_OTHER): Payer: PPO | Admitting: *Deleted

## 2018-09-04 DIAGNOSIS — I63442 Cerebral infarction due to embolism of left cerebellar artery: Secondary | ICD-10-CM | POA: Diagnosis not present

## 2018-09-06 LAB — CUP PACEART REMOTE DEVICE CHECK
Date Time Interrogation Session: 20200308011036
Implantable Pulse Generator Implant Date: 20190313

## 2018-09-11 NOTE — Progress Notes (Signed)
Carelink Summary Report / Loop Recorder 

## 2018-09-17 ENCOUNTER — Ambulatory Visit (HOSPITAL_COMMUNITY): Payer: PPO

## 2018-09-24 ENCOUNTER — Ambulatory Visit: Payer: PPO | Admitting: Vascular Surgery

## 2018-09-24 ENCOUNTER — Other Ambulatory Visit (HOSPITAL_COMMUNITY): Payer: PPO

## 2018-10-07 ENCOUNTER — Other Ambulatory Visit: Payer: Self-pay

## 2018-10-07 DIAGNOSIS — I713 Abdominal aortic aneurysm, ruptured, unspecified: Secondary | ICD-10-CM

## 2018-10-08 ENCOUNTER — Other Ambulatory Visit: Payer: Self-pay

## 2018-10-08 ENCOUNTER — Ambulatory Visit (INDEPENDENT_AMBULATORY_CARE_PROVIDER_SITE_OTHER): Payer: PPO | Admitting: *Deleted

## 2018-10-08 DIAGNOSIS — I63442 Cerebral infarction due to embolism of left cerebellar artery: Secondary | ICD-10-CM | POA: Diagnosis not present

## 2018-10-09 LAB — CUP PACEART REMOTE DEVICE CHECK
Date Time Interrogation Session: 20200410014012
Implantable Pulse Generator Implant Date: 20190313

## 2018-10-19 ENCOUNTER — Ambulatory Visit (HOSPITAL_COMMUNITY): Payer: PPO

## 2018-10-19 NOTE — Progress Notes (Signed)
Carelink Summary Report / Loop Recorder 

## 2018-10-22 ENCOUNTER — Ambulatory Visit: Payer: PPO | Admitting: Vascular Surgery

## 2018-10-22 ENCOUNTER — Other Ambulatory Visit (HOSPITAL_COMMUNITY): Payer: PPO

## 2018-11-10 ENCOUNTER — Ambulatory Visit (INDEPENDENT_AMBULATORY_CARE_PROVIDER_SITE_OTHER): Payer: PPO | Admitting: *Deleted

## 2018-11-10 ENCOUNTER — Other Ambulatory Visit: Payer: Self-pay

## 2018-11-10 DIAGNOSIS — I63442 Cerebral infarction due to embolism of left cerebellar artery: Secondary | ICD-10-CM

## 2018-11-10 DIAGNOSIS — I639 Cerebral infarction, unspecified: Secondary | ICD-10-CM | POA: Diagnosis not present

## 2018-11-11 LAB — CUP PACEART REMOTE DEVICE CHECK
Date Time Interrogation Session: 20200513023810
Implantable Pulse Generator Implant Date: 20190313

## 2018-11-20 ENCOUNTER — Other Ambulatory Visit: Payer: Self-pay

## 2018-11-20 NOTE — Patient Outreach (Signed)
[facility]) Care Management  11/20/2018   Kahlil Cowans [place] [birth year] [id number]  Subjective: Successful outreach to patient.  Two patient identifiers given by wife(on consent).  Mr and Mrs. Sammons were in the room together on speaker phone.  They have been staying in the home due to covid.  They state that they have a neighbor who is a Software engineer and helps with groceries and medications.  He states that he checked his blood sugar this morning an it was 210 which is unusual for him. He states that he averages 140's to 150's.  He thinks it may have been something he ate yesterday and also stress of not going out of the home. Advised the patient to just go outside and walk around his home for a change of scenery and to monitor his diet.  He verbalized understanding.   He is taking his medications as prescribed.  He has an appointment scheduled for checkup on June 10th and CT scan on July 7.   Current Medications:  Current Outpatient Medications  Medication Sig Dispense Refill  . ALPRAZolam (XANAX) 0.25 MG tablet Take 0.25 mg by mouth at bedtime as needed for anxiety (1/2 to 1 tablet once daily as needed).    . clopidogrel (PLAVIX) 75 MG tablet Take 1 tablet (75 mg total) by mouth daily. 30 tablet 2  . Coenzyme Q10 (COQ10) 100 MG CAPS Take 100 mg by mouth daily.    . diazepam (VALIUM) 5 MG tablet Take 2.5-5 mg by mouth at bedtime as needed for anxiety (restless legs).    Marland Kitchen doxazosin (CARDURA) 1 MG tablet Take 1 tablet (1 mg total) by mouth daily. 30 tablet 0  . levothyroxine (SYNTHROID, LEVOTHROID) 75 MCG tablet Take 75 mcg by mouth daily before breakfast.     . loratadine (CLARITIN) 10 MG tablet Take 10 mg by mouth daily.     Marland Kitchen losartan (COZAAR) 25 MG tablet Take 25 mg by mouth daily.    . metFORMIN (GLUCOPHAGE) 500 MG tablet Take 1,000 mg by mouth 2 (two) times daily.  0  . Multiple Vitamin (MULTIVITAMIN WITH MINERALS) TABS tablet Take 1 tablet by mouth daily.    .  Omega-3 1000 MG CAPS Take 1,000 mg by mouth daily.    . pantoprazole (PROTONIX) 40 MG tablet Take 1 tablet (40 mg total) by mouth 2 (two) times daily before a meal. 60 tablet 0  . rosuvastatin (CRESTOR) 10 MG tablet Take 10 mg by mouth at bedtime.     . saccharomyces boulardii (FLORASTOR) 250 MG capsule Take 1 capsule (250 mg total) by mouth 2 (two) times daily. 30 capsule 0  . sildenafil (REVATIO) 20 MG tablet Take 40-100 mg by mouth as needed (erectile dysfunction). For sexual activity  4  . vitamin B-12 (CYANOCOBALAMIN) 1000 MCG tablet Take 1,000 mcg by mouth daily.    . cholecalciferol (VITAMIN D) 1000 units tablet Take 1,000 Units by mouth daily with supper.     No current facility-administered medications for this visit.     Functional Status:  In your present state of health, do you have any difficulty performing the following activities: 02/04/2018 12/05/2017  Hearing? Y Y  Comment - bilateral hearing aids  Vision? N N  Difficulty concentrating or making decisions? N N  Walking or climbing stairs? Y Y  Dressing or bathing? N Y  Comment - due to recent hospitalization  Doing errands, shopping? Y Y  Comment - wife always accompanies him  Conservation officer, nature  and eating ? N Y  Comment - due to recent hospitalization  Using the Toilet? N N  In the past six months, have you accidently leaked urine? Y N  Do you have problems with loss of bowel control? N N  Managing your Medications? N Y  Comment - wife prepares a medication box for him and ensures adherence  Managing your Finances? N Y  Comment - wife manages the Science writer or managing your Housekeeping? N Y  Comment - wife does this  Some recent data might be hidden    Fall/Depression Screening: Fall Risk  11/20/2018 08/21/2018 07/17/2018  Falls in the past year? 0 0 0  Number falls in past yr: - - -  Injury with Fall? - - -  Risk for fall due to : - - -  Follow up - - -   PHQ 2/9 Scores 02/04/2018 01/20/2018  PHQ - 2  Score 2 2  PHQ- 9 Score - 5    Assessment: Patient will continue to benefit from health coach outreach for disease management and support. [facility]N CM Care Plan Problem One     Most Recent Value  THN Long Term Goal   In the next 90 days, 90% of all self monitored CBGs will meet goal of <180 and Hgb A1C will show improvement from 8.0% due to improved self management strategies  THN Long Term Goal Start Date  11/20/18  Interventions for Problem One Long Term Goal  Reviewed CBG readings, encouraged to monitor diet, encouraged exercise to relieve stress, encouraged medication adherence     Plan: [place] will contact patient in the month of August and patient agrees to next outreach.   Lazaro Arms RN, BSN, [place] Direct Dial:  [phone]  Fax: [phone]

## 2018-11-25 NOTE — Progress Notes (Signed)
Carelink Summary Report / Loop Recorder 

## 2018-12-10 DIAGNOSIS — Z0000 Encounter for general adult medical examination without abnormal findings: Secondary | ICD-10-CM | POA: Diagnosis not present

## 2018-12-10 DIAGNOSIS — E1122 Type 2 diabetes mellitus with diabetic chronic kidney disease: Secondary | ICD-10-CM | POA: Diagnosis not present

## 2018-12-10 DIAGNOSIS — H6123 Impacted cerumen, bilateral: Secondary | ICD-10-CM | POA: Diagnosis not present

## 2018-12-10 DIAGNOSIS — I639 Cerebral infarction, unspecified: Secondary | ICD-10-CM | POA: Diagnosis not present

## 2018-12-10 DIAGNOSIS — N183 Chronic kidney disease, stage 3 (moderate): Secondary | ICD-10-CM | POA: Diagnosis not present

## 2018-12-10 DIAGNOSIS — H6120 Impacted cerumen, unspecified ear: Secondary | ICD-10-CM | POA: Diagnosis not present

## 2018-12-10 DIAGNOSIS — Z79899 Other long term (current) drug therapy: Secondary | ICD-10-CM | POA: Diagnosis not present

## 2018-12-10 DIAGNOSIS — E785 Hyperlipidemia, unspecified: Secondary | ICD-10-CM | POA: Diagnosis not present

## 2018-12-10 DIAGNOSIS — E039 Hypothyroidism, unspecified: Secondary | ICD-10-CM | POA: Diagnosis not present

## 2018-12-14 ENCOUNTER — Ambulatory Visit (INDEPENDENT_AMBULATORY_CARE_PROVIDER_SITE_OTHER): Payer: PPO | Admitting: *Deleted

## 2018-12-14 DIAGNOSIS — I639 Cerebral infarction, unspecified: Secondary | ICD-10-CM | POA: Diagnosis not present

## 2018-12-14 LAB — CUP PACEART REMOTE DEVICE CHECK
Date Time Interrogation Session: 20200615024145
Implantable Pulse Generator Implant Date: 20190313

## 2018-12-21 ENCOUNTER — Other Ambulatory Visit: Payer: Self-pay

## 2018-12-21 NOTE — Patient Outreach (Signed)
[facility]) Care Management  12/21/2018  Arpan Eskelson [place] 1932/12/24 [id number]    RN Health Coach closing the program.  Patient is transitioning to external program [facility] for continued case management.  Lazaro Arms RN, BSN, [place] Direct Dial:  [phone]  Fax: [phone]

## 2018-12-22 ENCOUNTER — Encounter: Payer: Self-pay | Admitting: Vascular Surgery

## 2018-12-22 NOTE — Progress Notes (Signed)
Carelink Summary Report / Loop Recorder 

## 2019-01-05 ENCOUNTER — Ambulatory Visit (HOSPITAL_COMMUNITY)
Admission: RE | Admit: 2019-01-05 | Discharge: 2019-01-05 | Disposition: A | Discharge status: Home / Self Care | Payer: PPO | Source: Ambulatory Visit | Attending: Vascular Surgery | Admitting: Vascular Surgery

## 2019-01-05 ENCOUNTER — Other Ambulatory Visit: Payer: Self-pay

## 2019-01-05 DIAGNOSIS — I713 Abdominal aortic aneurysm, ruptured, unspecified: Secondary | ICD-10-CM

## 2019-01-05 DIAGNOSIS — I728 Aneurysm of other specified arteries: Secondary | ICD-10-CM | POA: Diagnosis not present

## 2019-01-05 MED ORDER — IOHEXOL 350 MG/ML SOLN
100.0000 mL | Freq: Once | INTRAVENOUS | Status: AC | PRN
  Administered 2019-01-05: 100 mL via INTRAVENOUS

## 2019-01-07 DIAGNOSIS — L570 Actinic keratosis: Secondary | ICD-10-CM | POA: Diagnosis not present

## 2019-01-07 DIAGNOSIS — D1801 Hemangioma of skin and subcutaneous tissue: Secondary | ICD-10-CM | POA: Diagnosis not present

## 2019-01-07 DIAGNOSIS — L821 Other seborrheic keratosis: Secondary | ICD-10-CM | POA: Diagnosis not present

## 2019-01-14 ENCOUNTER — Other Ambulatory Visit: Payer: Self-pay

## 2019-01-14 ENCOUNTER — Encounter: Payer: Self-pay | Admitting: Vascular Surgery

## 2019-01-14 ENCOUNTER — Ambulatory Visit (INDEPENDENT_AMBULATORY_CARE_PROVIDER_SITE_OTHER): Payer: PPO | Admitting: Vascular Surgery

## 2019-01-14 VITALS — BP 126/66 | HR 71 | Temp 97.8°F | Resp 20 | Ht 63.0 in | Wt 127.0 lb

## 2019-01-14 DIAGNOSIS — I728 Aneurysm of other specified arteries: Secondary | ICD-10-CM | POA: Diagnosis not present

## 2019-01-14 DIAGNOSIS — I714 Abdominal aortic aneurysm, without rupture, unspecified: Secondary | ICD-10-CM

## 2019-01-14 NOTE — Progress Notes (Signed)
Patient is an 85 year old male who returns for follow-up today.  He was last seen July 2015.  He has previously undergone aortobiiliac grafting for abdominal aortic and iliac aneurysms.  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  He has really had no new medical events since I saw him 5 years ago.  He continues to have no complaints of abdominal or back pain.  He states he has slowed down and is not as active as he used to be but still stays very socially active.  He is able to walk.  He cannot walk as far as he used to be able to.  Review of systems: He has no complaints of shortness of breath.  He does not have chest pain.  Current Outpatient Medications on File Prior to Visit  Medication Sig Dispense Refill  . ALPRAZolam (XANAX) 0.25 MG tablet Take 0.25 mg by mouth at bedtime as needed for anxiety (1/2 to 1 tablet once daily as needed).    . cholecalciferol (VITAMIN D) 1000 units tablet Take 1,000 Units by mouth daily with supper.    . clopidogrel (PLAVIX) 75 MG tablet Take 1 tablet (75 mg total) by mouth daily. 30 tablet 2  . Coenzyme Q10 (COQ10) 100 MG CAPS Take 100 mg by mouth daily.    . diazepam (VALIUM) 5 MG tablet Take 2.5-5 mg by mouth at bedtime as needed for anxiety (restless legs).    Marland Kitchen doxazosin (CARDURA) 1 MG tablet Take 1 tablet (1 mg total) by mouth daily. 30 tablet 0  . levothyroxine (SYNTHROID, LEVOTHROID) 75 MCG tablet Take 75 mcg by mouth daily before breakfast.     . loratadine (CLARITIN) 10 MG tablet Take 10 mg by mouth daily.     Marland Kitchen losartan (COZAAR) 25 MG tablet Take 25 mg by mouth daily.    . metFORMIN (GLUCOPHAGE) 500 MG tablet Take 1,000 mg by mouth 2 (two) times daily.  0  . Multiple Vitamin (MULTIVITAMIN WITH MINERALS) TABS tablet Take 1 tablet by mouth daily.    . Omega-3 1000 MG CAPS Take 1,000 mg by mouth daily.    . pantoprazole (PROTONIX) 40 MG tablet Take 1 tablet (40 mg total) by mouth 2 (two) times daily before a meal. 60 tablet 0   . rosuvastatin (CRESTOR) 10 MG tablet Take 10 mg by mouth at bedtime.     . saccharomyces boulardii (FLORASTOR) 250 MG capsule Take 1 capsule (250 mg total) by mouth 2 (two) times daily. 30 capsule 0  . sildenafil (REVATIO) 20 MG tablet Take 40-100 mg by mouth as needed (erectile dysfunction). For sexual activity  4  . vitamin B-12 (CYANOCOBALAMIN) 1000 MCG tablet Take 1,000 mcg by mouth daily.     No current facility-administered medications on file prior to visit.     Past Medical History:  Diagnosis Date  . AAA (abdominal aortic aneurysm) ([place])   . Celiac artery aneurysm ([place])   . Diabetes mellitus without complication ([place])   . Hiatal hernia   . HOH (hard of hearing)   . Hyperlipidemia   . [facility]     Past Surgical History:  Procedure Laterality Date  . ABDOMINAL AORTIC ANEURYSM REPAIR  10-2003   also had iliac aneurysm repair  . CHOLECYSTECTOMY     Laparoscopic cholecystectomy  . EYE SURGERY Bilateral    cataracts  . HERNIA REPAIR    . LOOP RECORDER INSERTION N/A 09/10/2017   Procedure: LOOP RECORDER INSERTION;  Surgeon: Caryl Comes,  Revonda Standard, MD;  Location: [facility];  Service: Cardiovascular;  Laterality: N/A;  . TEE WITHOUT CARDIOVERSION N/A 09/10/2017   Procedure: TRANSESOPHAGEAL ECHOCARDIOGRAM (TEE);  Surgeon: Jerline Pain, MD;  Location: [facility] ENDOSCOPY;  Service: Cardiovascular;  Laterality: N/A;    Social History   Socioeconomic History  . Marital status: Married    Spouse name: Not on file  . Number of children: Not on file  . Years of education: Not on file  . Highest education level: Not on file  Occupational History  . Not on file  Social Needs  . Financial resource strain: Not on file  . Food insecurity    Worry: Not on file    Inability: Not on file  . Transportation needs    Medical: Not on file    Non-medical: Not on file  Tobacco Use  . Smoking status: Never Smoker  . Smokeless tobacco: Never Used  Substance and Sexual Activity  .  Alcohol use: No  . Drug use: No  . Sexual activity: Not on file  Lifestyle  . Physical activity    Days per week: Not on file    Minutes per session: Not on file  . Stress: Not on file  Relationships  . Social Herbalist on phone: Not on file    Gets together: Not on file    Attends religious service: Not on file    Active member of club or organization: Not on file    Attends meetings of clubs or organizations: Not on file    Relationship status: Not on file  . Intimate partner violence    Fear of current or ex partner: Not on file    Emotionally abused: Not on file    Physically abused: Not on file    Forced sexual activity: Not on file  Other Topics Concern  . Not on file  Social History Narrative  . Not on file    Physical exam:  Vitals:   01/14/19 1128  BP: 126/66  Pulse: 71  Resp: 20  Temp: 97.8 F (36.6 C)  SpO2: 98%  Weight: 127 lb (57.6 kg)  Height: 5\' 3"  (1.6 m)    Abdomen: Well-healed midline laparotomy scar 2+ femoral pulses bilaterally no evidence of hernia no pulsatile mass  Extremities: 2+ femoral dorsalis pedis and posterior tibial pulses bilaterally  Data: I reviewed the patient's CT angiogram from January 05, 2019.  This shows a widely patent aortoiliac graft no evidence of anastomotic pseudoaneurysms celiac artery aneurysm remains 14 mm in diameter and has been stable for almost 10 years  Assessment: Patient doing well status post repair of abdominal aortic aneurysm, celiac artery aneurysm is stable in size.  Plan: The patient will follow-up in 5 years with repeat CT scan of the abdomen and pelvis.  Ruta Hinds, MD [facility] Office: [phone] Pager: [phone]

## 2019-01-15 ENCOUNTER — Ambulatory Visit (INDEPENDENT_AMBULATORY_CARE_PROVIDER_SITE_OTHER): Payer: PPO | Admitting: *Deleted

## 2019-01-15 DIAGNOSIS — I639 Cerebral infarction, unspecified: Secondary | ICD-10-CM

## 2019-01-17 LAB — CUP PACEART REMOTE DEVICE CHECK
Date Time Interrogation Session: 20200718044217
Implantable Pulse Generator Implant Date: 20190313

## 2019-01-20 ENCOUNTER — Encounter: Payer: Self-pay | Admitting: Cardiology

## 2019-01-20 NOTE — Progress Notes (Signed)
Carelink Summary Report / Loop Recorder 

## 2019-01-21 ENCOUNTER — Ambulatory Visit: Payer: Medicare HMO | Admitting: Vascular Surgery

## 2019-01-28 ENCOUNTER — Ambulatory Visit: Payer: PPO | Admitting: Vascular Surgery

## 2019-02-01 DIAGNOSIS — I7771 Dissection of carotid artery: Secondary | ICD-10-CM | POA: Diagnosis not present

## 2019-02-01 DIAGNOSIS — G2581 Restless legs syndrome: Secondary | ICD-10-CM | POA: Diagnosis not present

## 2019-02-01 DIAGNOSIS — K219 Gastro-esophageal reflux disease without esophagitis: Secondary | ICD-10-CM | POA: Diagnosis not present

## 2019-02-01 DIAGNOSIS — G252 Other specified forms of tremor: Secondary | ICD-10-CM | POA: Diagnosis not present

## 2019-02-01 DIAGNOSIS — Z7984 Long term (current) use of oral hypoglycemic drugs: Secondary | ICD-10-CM | POA: Diagnosis not present

## 2019-02-01 DIAGNOSIS — R413 Other amnesia: Secondary | ICD-10-CM | POA: Diagnosis not present

## 2019-02-01 DIAGNOSIS — Z86010 Personal history of colonic polyps: Secondary | ICD-10-CM | POA: Diagnosis not present

## 2019-02-01 DIAGNOSIS — E119 Type 2 diabetes mellitus without complications: Secondary | ICD-10-CM | POA: Diagnosis not present

## 2019-02-01 DIAGNOSIS — R1310 Dysphagia, unspecified: Secondary | ICD-10-CM | POA: Diagnosis not present

## 2019-02-01 DIAGNOSIS — I63412 Cerebral infarction due to embolism of left middle cerebral artery: Secondary | ICD-10-CM | POA: Diagnosis not present

## 2019-02-18 ENCOUNTER — Ambulatory Visit (INDEPENDENT_AMBULATORY_CARE_PROVIDER_SITE_OTHER): Payer: PPO | Admitting: *Deleted

## 2019-02-18 DIAGNOSIS — I63412 Cerebral infarction due to embolism of left middle cerebral artery: Secondary | ICD-10-CM

## 2019-02-18 LAB — CUP PACEART REMOTE DEVICE CHECK
Date Time Interrogation Session: 20200820074409
Implantable Pulse Generator Implant Date: 20190313

## 2019-02-22 ENCOUNTER — Ambulatory Visit: Payer: PPO

## 2019-02-26 NOTE — Progress Notes (Signed)
Carelink Summary Report / Loop Recorder 

## 2019-03-04 DIAGNOSIS — E039 Hypothyroidism, unspecified: Secondary | ICD-10-CM | POA: Diagnosis not present

## 2019-03-04 DIAGNOSIS — N529 Male erectile dysfunction, unspecified: Secondary | ICD-10-CM | POA: Diagnosis not present

## 2019-03-04 DIAGNOSIS — N183 Chronic kidney disease, stage 3 (moderate): Secondary | ICD-10-CM | POA: Diagnosis not present

## 2019-03-04 DIAGNOSIS — E1122 Type 2 diabetes mellitus with diabetic chronic kidney disease: Secondary | ICD-10-CM | POA: Diagnosis not present

## 2019-03-04 DIAGNOSIS — Z5181 Encounter for therapeutic drug level monitoring: Secondary | ICD-10-CM | POA: Diagnosis not present

## 2019-03-23 ENCOUNTER — Ambulatory Visit (INDEPENDENT_AMBULATORY_CARE_PROVIDER_SITE_OTHER): Payer: PPO | Admitting: *Deleted

## 2019-03-23 DIAGNOSIS — I63412 Cerebral infarction due to embolism of left middle cerebral artery: Secondary | ICD-10-CM

## 2019-03-23 LAB — CUP PACEART REMOTE DEVICE CHECK
Date Time Interrogation Session: 20200922073518
Implantable Pulse Generator Implant Date: 20190313

## 2019-04-01 NOTE — Progress Notes (Signed)
Carelink Summary Report / Loop Recorder 

## 2019-04-25 LAB — CUP PACEART REMOTE DEVICE CHECK
Date Time Interrogation Session: 20201025073556
Implantable Pulse Generator Implant Date: 20190313

## 2019-04-26 ENCOUNTER — Ambulatory Visit (INDEPENDENT_AMBULATORY_CARE_PROVIDER_SITE_OTHER): Payer: PPO | Admitting: *Deleted

## 2019-04-26 DIAGNOSIS — I63412 Cerebral infarction due to embolism of left middle cerebral artery: Secondary | ICD-10-CM | POA: Diagnosis not present

## 2019-05-14 NOTE — Progress Notes (Signed)
Carelink Summary Report / Loop Recorder 

## 2019-05-30 LAB — CUP PACEART REMOTE DEVICE CHECK
Date Time Interrogation Session: 20201127090016
Implantable Pulse Generator Implant Date: 20190313

## 2019-05-31 ENCOUNTER — Ambulatory Visit (INDEPENDENT_AMBULATORY_CARE_PROVIDER_SITE_OTHER): Payer: PPO | Admitting: *Deleted

## 2019-05-31 DIAGNOSIS — I63412 Cerebral infarction due to embolism of left middle cerebral artery: Secondary | ICD-10-CM

## 2019-06-15 DIAGNOSIS — I639 Cerebral infarction, unspecified: Secondary | ICD-10-CM | POA: Diagnosis not present

## 2019-06-15 DIAGNOSIS — E039 Hypothyroidism, unspecified: Secondary | ICD-10-CM | POA: Diagnosis not present

## 2019-06-15 DIAGNOSIS — E785 Hyperlipidemia, unspecified: Secondary | ICD-10-CM | POA: Diagnosis not present

## 2019-06-15 DIAGNOSIS — Z79899 Other long term (current) drug therapy: Secondary | ICD-10-CM | POA: Diagnosis not present

## 2019-06-15 DIAGNOSIS — E1169 Type 2 diabetes mellitus with other specified complication: Secondary | ICD-10-CM | POA: Diagnosis not present

## 2019-06-15 DIAGNOSIS — F324 Major depressive disorder, single episode, in partial remission: Secondary | ICD-10-CM | POA: Diagnosis not present

## 2019-06-15 DIAGNOSIS — H6120 Impacted cerumen, unspecified ear: Secondary | ICD-10-CM | POA: Diagnosis not present

## 2019-06-23 NOTE — Progress Notes (Signed)
ILR remote 

## 2019-06-30 DIAGNOSIS — Z79899 Other long term (current) drug therapy: Secondary | ICD-10-CM | POA: Diagnosis not present

## 2019-06-30 DIAGNOSIS — E039 Hypothyroidism, unspecified: Secondary | ICD-10-CM | POA: Diagnosis not present

## 2019-06-30 DIAGNOSIS — E1169 Type 2 diabetes mellitus with other specified complication: Secondary | ICD-10-CM | POA: Diagnosis not present

## 2019-06-30 DIAGNOSIS — E785 Hyperlipidemia, unspecified: Secondary | ICD-10-CM | POA: Diagnosis not present

## 2019-07-01 ENCOUNTER — Ambulatory Visit (INDEPENDENT_AMBULATORY_CARE_PROVIDER_SITE_OTHER): Payer: PPO | Admitting: *Deleted

## 2019-07-01 DIAGNOSIS — I6330 Cerebral infarction due to thrombosis of unspecified cerebral artery: Secondary | ICD-10-CM

## 2019-07-01 LAB — CUP PACEART REMOTE DEVICE CHECK
Date Time Interrogation Session: 20201230230725
Implantable Pulse Generator Implant Date: 20190313

## 2019-07-02 NOTE — Progress Notes (Signed)
ILR remote 

## 2019-08-02 ENCOUNTER — Ambulatory Visit (INDEPENDENT_AMBULATORY_CARE_PROVIDER_SITE_OTHER): Payer: PPO | Admitting: *Deleted

## 2019-08-02 DIAGNOSIS — I6330 Cerebral infarction due to thrombosis of unspecified cerebral artery: Secondary | ICD-10-CM

## 2019-08-02 LAB — CUP PACEART REMOTE DEVICE CHECK
Date Time Interrogation Session: 20210201000331
Implantable Pulse Generator Implant Date: 20190313

## 2019-08-02 NOTE — Progress Notes (Signed)
ILR Remote 

## 2019-08-19 IMAGING — CT CT HEAD W/O CM
3 series · 15 of 47 positions shown, 18 images · non-contrast
Comparison: CT of the head performed 09/08/2017, and MRI of the
brain performed 09/09/2017

CLINICAL DATA: Status post fall, with small skin tear at the top of
the head. Altered level of consciousness and fever.

EXAM:
CT HEAD WITHOUT CONTRAST
TECHNIQUE: Contiguous axial images were obtained from the base of the skull
through the vertex without intravenous contrast.

[Series 2: head wo · axial · 0.47mm/px · z∈[-111,+19]mm · 9 of 32 slices shown, 12 images]
[im 3/32  brain]
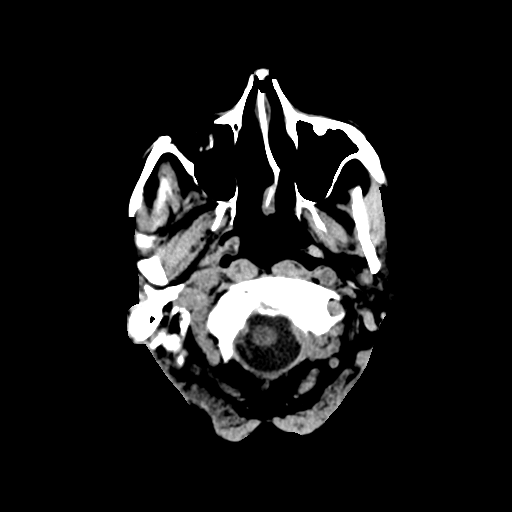
[im 3/32  bone]
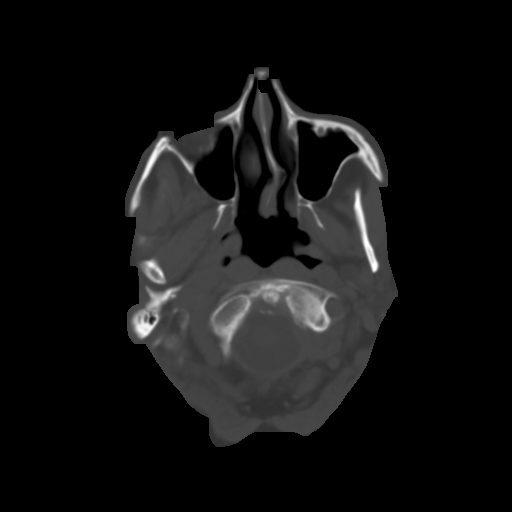
[im 6/32  brain]
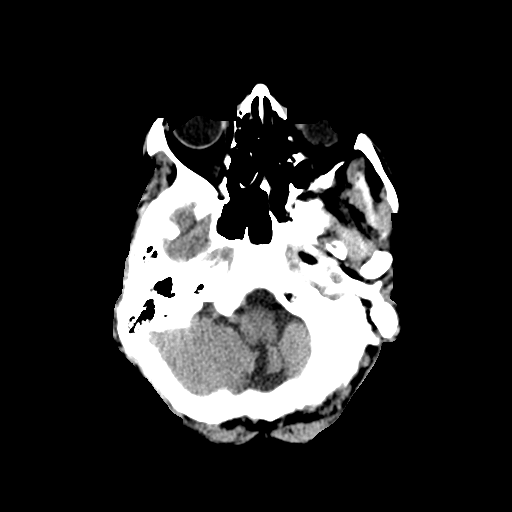
[im 9/32  brain]
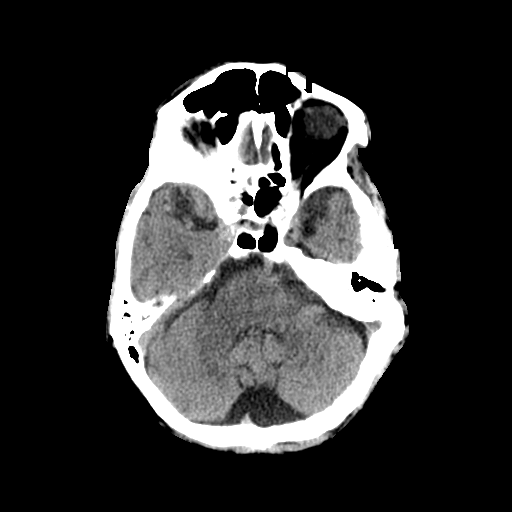
[im 12/32  brain]
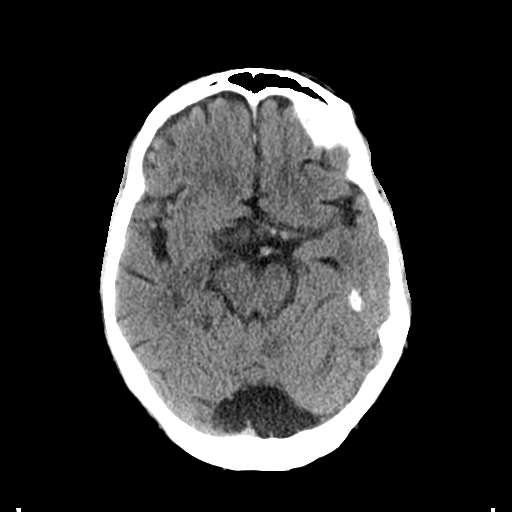
[im 17/32  brain]
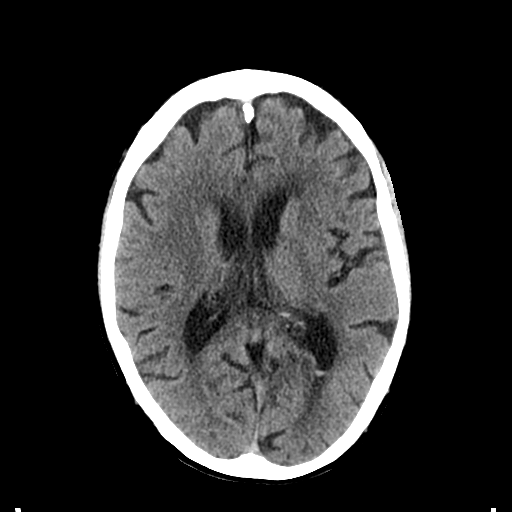
[im 17/32  bone]
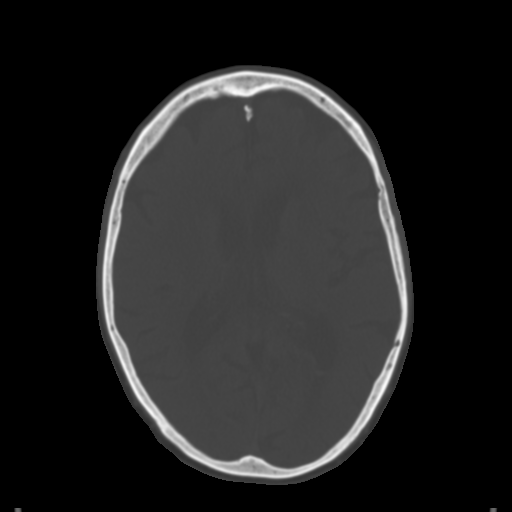
[im 20/32  brain]
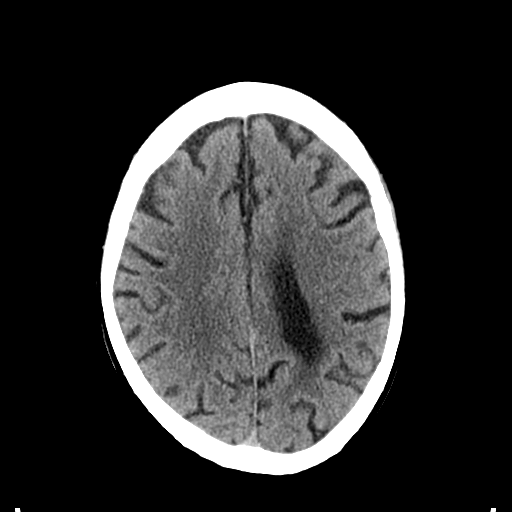
[im 23/32  brain]
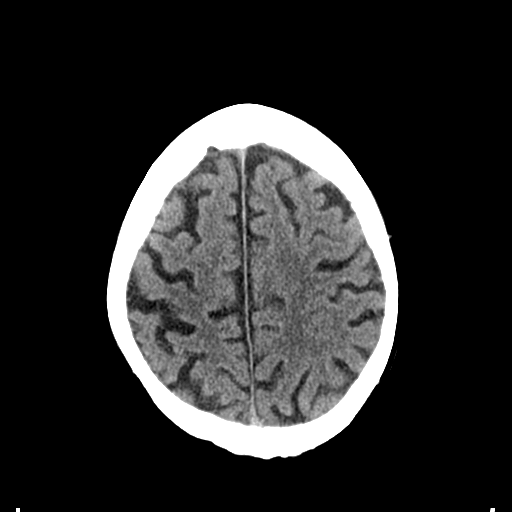
[im 26/32  brain]
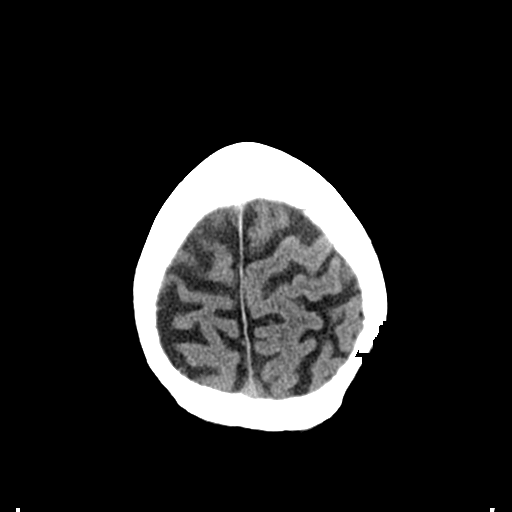
[im 29/32  brain]
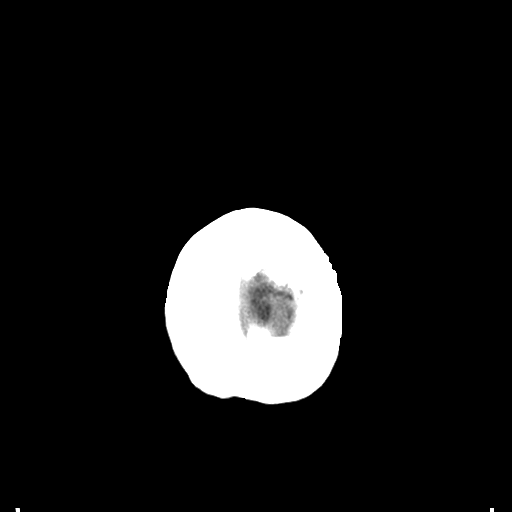
[im 29/32  bone]
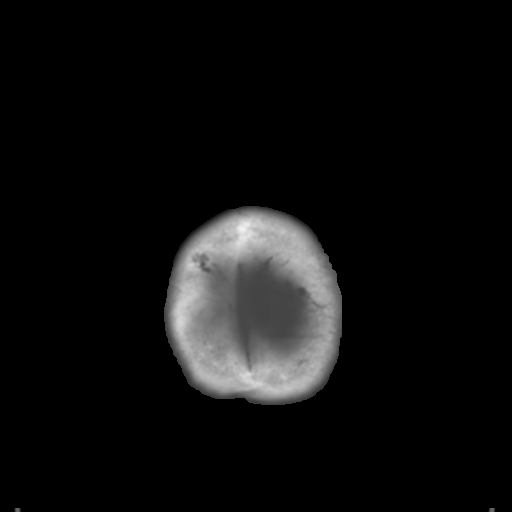

[Series 4: coronal soft tissue · coronal · 0.29mm/px · 3 of 65 slices shown]
[im 22/65  brain]
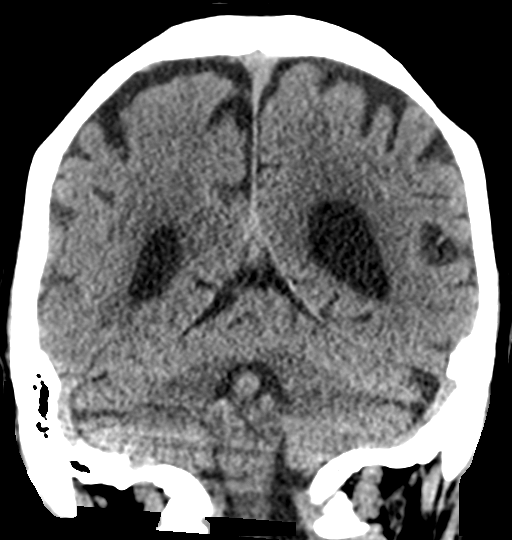
[im 29/65  brain]
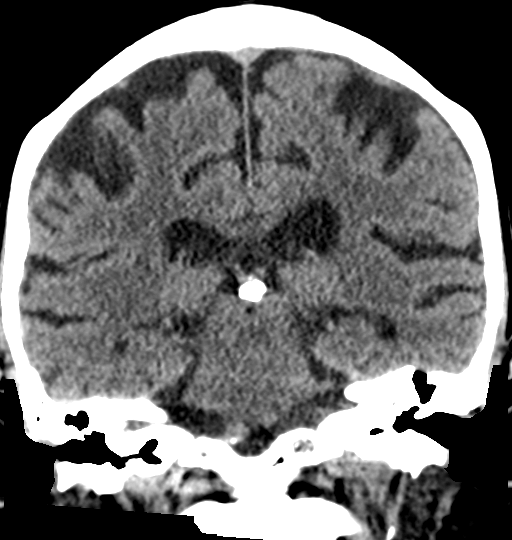
[im 36/65  brain]
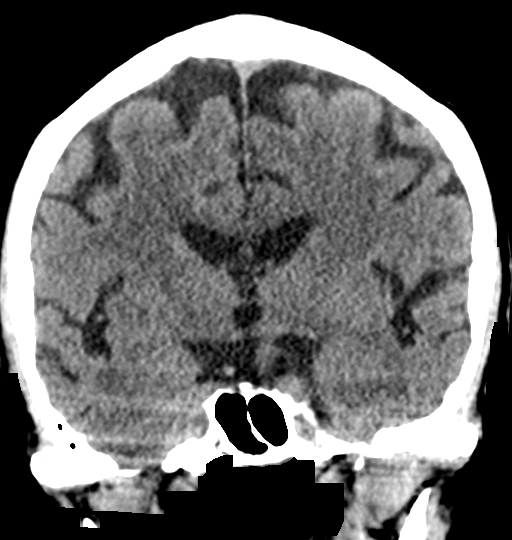

[Series 5: sagittal soft tissue · sagittal · 0.30mm/px · 3 of 49 slices shown]
[im 17/49  brain]
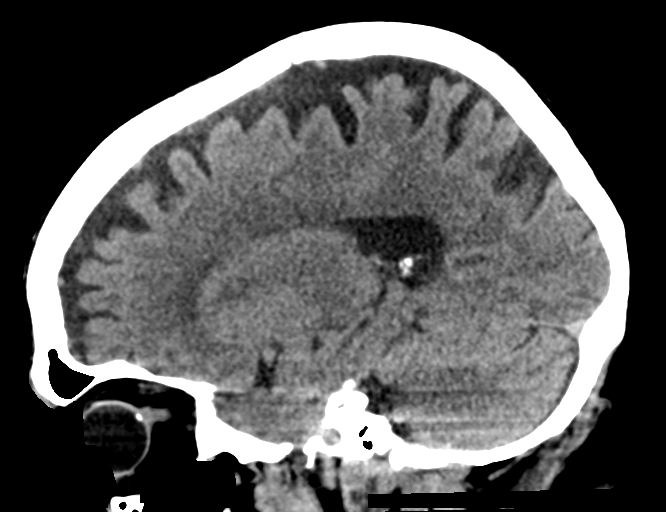
[im 25/49  brain]
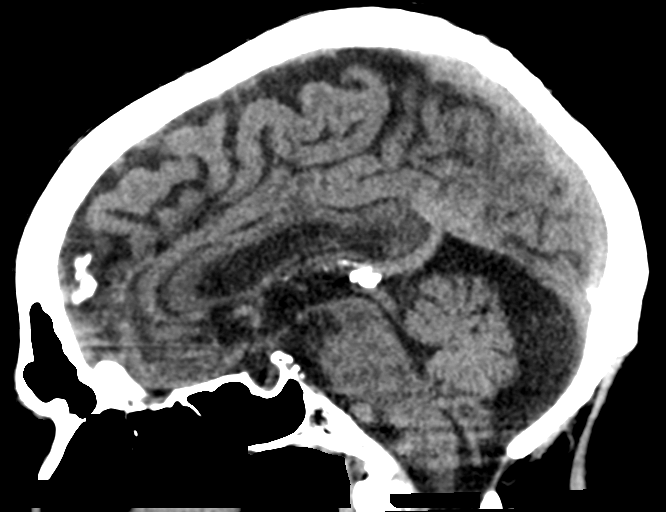
[im 33/49  brain]
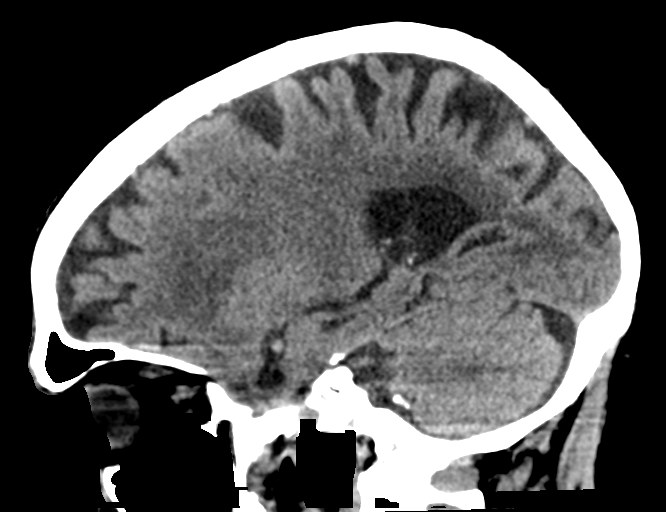

[15 of 47 positions shown; findings below may reference images not displayed]

FINDINGS: Brain: No evidence of acute infarction, hemorrhage, hydrocephalus,
extra-axial collection or mass lesion / mass effect.

Prominence of the ventricles and sulci reflects mild to moderate
cortical volume loss. Chronic lacunar infarcts are noted at the
anterior left basal ganglia. Cerebellar atrophy is noted. Scattered
periventricular and subcortical white matter change likely reflects
small vessel ischemic microangiopathy.

The brainstem and fourth ventricle are within normal limits. The
cerebral hemispheres demonstrate grossly normal gray-white
differentiation. No mass effect or midline shift is seen.

Vascular: No hyperdense vessel or unexpected calcification.

Skull: There is no evidence of fracture; visualized osseous
structures are unremarkable in appearance.

Sinuses/Orbits: Mild bilateral optic drusen are noted. The orbits
are otherwise unremarkable. The paranasal sinuses and mastoid air
cells are well-aerated.

Other: No significant soft tissue abnormalities are seen.
IMPRESSION: 1. No evidence of traumatic intracranial injury or fracture.
2. Mild to moderate cortical volume loss and scattered small vessel
ischemic microangiopathy.
3. Chronic lacunar infarcts at the anterior left basal ganglia.
4. Mild bilateral optic drusen noted.

## 2019-09-02 ENCOUNTER — Ambulatory Visit (INDEPENDENT_AMBULATORY_CARE_PROVIDER_SITE_OTHER): Payer: PPO | Admitting: *Deleted

## 2019-09-02 DIAGNOSIS — E1122 Type 2 diabetes mellitus with diabetic chronic kidney disease: Secondary | ICD-10-CM | POA: Diagnosis not present

## 2019-09-02 DIAGNOSIS — I6330 Cerebral infarction due to thrombosis of unspecified cerebral artery: Secondary | ICD-10-CM

## 2019-09-02 DIAGNOSIS — N529 Male erectile dysfunction, unspecified: Secondary | ICD-10-CM | POA: Diagnosis not present

## 2019-09-02 DIAGNOSIS — N1831 Chronic kidney disease, stage 3a: Secondary | ICD-10-CM | POA: Diagnosis not present

## 2019-09-02 DIAGNOSIS — E039 Hypothyroidism, unspecified: Secondary | ICD-10-CM | POA: Diagnosis not present

## 2019-09-02 DIAGNOSIS — Z5181 Encounter for therapeutic drug level monitoring: Secondary | ICD-10-CM | POA: Diagnosis not present

## 2019-09-02 LAB — CUP PACEART REMOTE DEVICE CHECK
Date Time Interrogation Session: 20210304025133
Implantable Pulse Generator Implant Date: 20190313

## 2019-09-02 NOTE — Progress Notes (Signed)
ILR Remote 

## 2019-09-08 DIAGNOSIS — R413 Other amnesia: Secondary | ICD-10-CM | POA: Diagnosis not present

## 2019-09-08 DIAGNOSIS — Z8673 Personal history of transient ischemic attack (TIA), and cerebral infarction without residual deficits: Secondary | ICD-10-CM | POA: Diagnosis not present

## 2019-10-04 ENCOUNTER — Ambulatory Visit (INDEPENDENT_AMBULATORY_CARE_PROVIDER_SITE_OTHER): Payer: PPO | Admitting: *Deleted

## 2019-10-04 DIAGNOSIS — I6330 Cerebral infarction due to thrombosis of unspecified cerebral artery: Secondary | ICD-10-CM | POA: Diagnosis not present

## 2019-10-04 LAB — CUP PACEART REMOTE DEVICE CHECK
Date Time Interrogation Session: 20210404035416
Implantable Pulse Generator Implant Date: 20190313

## 2019-10-05 NOTE — Progress Notes (Signed)
ILR Remote 

## 2019-11-04 LAB — CUP PACEART REMOTE DEVICE CHECK
Date Time Interrogation Session: 20210505035832
Implantable Pulse Generator Implant Date: 20190313

## 2019-11-08 ENCOUNTER — Ambulatory Visit (INDEPENDENT_AMBULATORY_CARE_PROVIDER_SITE_OTHER): Payer: PPO | Admitting: *Deleted

## 2019-11-08 DIAGNOSIS — I63412 Cerebral infarction due to embolism of left middle cerebral artery: Secondary | ICD-10-CM

## 2019-11-09 NOTE — Progress Notes (Signed)
Carelink Summary Report / Loop Recorder 

## 2019-12-13 ENCOUNTER — Ambulatory Visit (INDEPENDENT_AMBULATORY_CARE_PROVIDER_SITE_OTHER): Payer: PPO | Admitting: *Deleted

## 2019-12-13 DIAGNOSIS — I63412 Cerebral infarction due to embolism of left middle cerebral artery: Secondary | ICD-10-CM | POA: Diagnosis not present

## 2019-12-13 LAB — CUP PACEART REMOTE DEVICE CHECK
Date Time Interrogation Session: 20210613231638
Implantable Pulse Generator Implant Date: 20190313

## 2019-12-15 NOTE — Progress Notes (Signed)
Carelink Summary Report / Loop Recorder 

## 2020-01-10 DIAGNOSIS — L821 Other seborrheic keratosis: Secondary | ICD-10-CM | POA: Diagnosis not present

## 2020-01-10 DIAGNOSIS — L570 Actinic keratosis: Secondary | ICD-10-CM | POA: Diagnosis not present

## 2020-01-10 DIAGNOSIS — D692 Other nonthrombocytopenic purpura: Secondary | ICD-10-CM | POA: Diagnosis not present

## 2020-01-11 DIAGNOSIS — E039 Hypothyroidism, unspecified: Secondary | ICD-10-CM | POA: Diagnosis not present

## 2020-01-11 DIAGNOSIS — E1122 Type 2 diabetes mellitus with diabetic chronic kidney disease: Secondary | ICD-10-CM | POA: Diagnosis not present

## 2020-01-11 DIAGNOSIS — Z5181 Encounter for therapeutic drug level monitoring: Secondary | ICD-10-CM | POA: Diagnosis not present

## 2020-01-11 DIAGNOSIS — N1831 Chronic kidney disease, stage 3a: Secondary | ICD-10-CM | POA: Diagnosis not present

## 2020-01-17 ENCOUNTER — Ambulatory Visit (INDEPENDENT_AMBULATORY_CARE_PROVIDER_SITE_OTHER): Payer: PPO | Admitting: *Deleted

## 2020-01-17 DIAGNOSIS — E039 Hypothyroidism, unspecified: Secondary | ICD-10-CM | POA: Diagnosis not present

## 2020-01-17 DIAGNOSIS — Z0000 Encounter for general adult medical examination without abnormal findings: Secondary | ICD-10-CM | POA: Diagnosis not present

## 2020-01-17 DIAGNOSIS — F330 Major depressive disorder, recurrent, mild: Secondary | ICD-10-CM | POA: Diagnosis not present

## 2020-01-17 DIAGNOSIS — I739 Peripheral vascular disease, unspecified: Secondary | ICD-10-CM | POA: Diagnosis not present

## 2020-01-17 DIAGNOSIS — I728 Aneurysm of other specified arteries: Secondary | ICD-10-CM | POA: Diagnosis not present

## 2020-01-17 DIAGNOSIS — I63412 Cerebral infarction due to embolism of left middle cerebral artery: Secondary | ICD-10-CM | POA: Diagnosis not present

## 2020-01-17 DIAGNOSIS — I7771 Dissection of carotid artery: Secondary | ICD-10-CM | POA: Diagnosis not present

## 2020-01-17 DIAGNOSIS — I714 Abdominal aortic aneurysm, without rupture: Secondary | ICD-10-CM | POA: Diagnosis not present

## 2020-01-17 DIAGNOSIS — E1122 Type 2 diabetes mellitus with diabetic chronic kidney disease: Secondary | ICD-10-CM | POA: Diagnosis not present

## 2020-01-17 DIAGNOSIS — Z5181 Encounter for therapeutic drug level monitoring: Secondary | ICD-10-CM | POA: Diagnosis not present

## 2020-01-17 DIAGNOSIS — I639 Cerebral infarction, unspecified: Secondary | ICD-10-CM | POA: Diagnosis not present

## 2020-01-17 DIAGNOSIS — N1831 Chronic kidney disease, stage 3a: Secondary | ICD-10-CM | POA: Diagnosis not present

## 2020-01-17 DIAGNOSIS — E785 Hyperlipidemia, unspecified: Secondary | ICD-10-CM | POA: Diagnosis not present

## 2020-01-17 LAB — CUP PACEART REMOTE DEVICE CHECK
Date Time Interrogation Session: 20210718232304
Implantable Pulse Generator Implant Date: 20190313

## 2020-01-19 NOTE — Progress Notes (Signed)
Carelink Summary Report / Loop Recorder 

## 2020-02-20 LAB — CUP PACEART REMOTE DEVICE CHECK
Date Time Interrogation Session: 20210819001431
Implantable Pulse Generator Implant Date: 20190313

## 2020-02-21 ENCOUNTER — Ambulatory Visit (INDEPENDENT_AMBULATORY_CARE_PROVIDER_SITE_OTHER): Payer: PPO | Admitting: *Deleted

## 2020-02-21 DIAGNOSIS — I63412 Cerebral infarction due to embolism of left middle cerebral artery: Secondary | ICD-10-CM | POA: Diagnosis not present

## 2020-02-25 NOTE — Progress Notes (Signed)
Carelink Summary Report / Loop Recorder 

## 2020-03-09 DIAGNOSIS — E039 Hypothyroidism, unspecified: Secondary | ICD-10-CM | POA: Diagnosis not present

## 2020-03-09 DIAGNOSIS — E1122 Type 2 diabetes mellitus with diabetic chronic kidney disease: Secondary | ICD-10-CM | POA: Diagnosis not present

## 2020-03-09 DIAGNOSIS — Z7984 Long term (current) use of oral hypoglycemic drugs: Secondary | ICD-10-CM | POA: Diagnosis not present

## 2020-03-09 DIAGNOSIS — N1831 Chronic kidney disease, stage 3a: Secondary | ICD-10-CM | POA: Diagnosis not present

## 2020-03-14 DIAGNOSIS — H903 Sensorineural hearing loss, bilateral: Secondary | ICD-10-CM | POA: Diagnosis not present

## 2020-03-15 DIAGNOSIS — R413 Other amnesia: Secondary | ICD-10-CM | POA: Diagnosis not present

## 2020-03-15 DIAGNOSIS — R251 Tremor, unspecified: Secondary | ICD-10-CM | POA: Diagnosis not present

## 2020-03-27 ENCOUNTER — Ambulatory Visit (INDEPENDENT_AMBULATORY_CARE_PROVIDER_SITE_OTHER): Payer: PPO | Admitting: Emergency Medicine

## 2020-03-27 DIAGNOSIS — I63412 Cerebral infarction due to embolism of left middle cerebral artery: Secondary | ICD-10-CM

## 2020-03-28 DIAGNOSIS — R531 Weakness: Secondary | ICD-10-CM | POA: Diagnosis not present

## 2020-03-28 DIAGNOSIS — R2689 Other abnormalities of gait and mobility: Secondary | ICD-10-CM | POA: Diagnosis not present

## 2020-03-28 LAB — CUP PACEART REMOTE DEVICE CHECK
Date Time Interrogation Session: 20210919002952
Implantable Pulse Generator Implant Date: 20190313

## 2020-03-29 NOTE — Progress Notes (Signed)
Carelink Summary Report / Loop Recorder 

## 2020-03-30 DIAGNOSIS — R2689 Other abnormalities of gait and mobility: Secondary | ICD-10-CM | POA: Diagnosis not present

## 2020-03-30 DIAGNOSIS — R531 Weakness: Secondary | ICD-10-CM | POA: Diagnosis not present

## 2020-04-03 DIAGNOSIS — R2689 Other abnormalities of gait and mobility: Secondary | ICD-10-CM | POA: Diagnosis not present

## 2020-04-03 DIAGNOSIS — R531 Weakness: Secondary | ICD-10-CM | POA: Diagnosis not present

## 2020-04-04 DIAGNOSIS — Z961 Presence of intraocular lens: Secondary | ICD-10-CM | POA: Diagnosis not present

## 2020-04-04 DIAGNOSIS — E119 Type 2 diabetes mellitus without complications: Secondary | ICD-10-CM | POA: Diagnosis not present

## 2020-04-04 DIAGNOSIS — H524 Presbyopia: Secondary | ICD-10-CM | POA: Diagnosis not present

## 2020-04-06 DIAGNOSIS — R531 Weakness: Secondary | ICD-10-CM | POA: Diagnosis not present

## 2020-04-06 DIAGNOSIS — H903 Sensorineural hearing loss, bilateral: Secondary | ICD-10-CM | POA: Diagnosis not present

## 2020-04-06 DIAGNOSIS — R2689 Other abnormalities of gait and mobility: Secondary | ICD-10-CM | POA: Diagnosis not present

## 2020-04-11 DIAGNOSIS — R2689 Other abnormalities of gait and mobility: Secondary | ICD-10-CM | POA: Diagnosis not present

## 2020-04-11 DIAGNOSIS — R531 Weakness: Secondary | ICD-10-CM | POA: Diagnosis not present

## 2020-04-13 DIAGNOSIS — R531 Weakness: Secondary | ICD-10-CM | POA: Diagnosis not present

## 2020-04-13 DIAGNOSIS — R2689 Other abnormalities of gait and mobility: Secondary | ICD-10-CM | POA: Diagnosis not present

## 2020-04-18 DIAGNOSIS — R531 Weakness: Secondary | ICD-10-CM | POA: Diagnosis not present

## 2020-04-18 DIAGNOSIS — R2689 Other abnormalities of gait and mobility: Secondary | ICD-10-CM | POA: Diagnosis not present

## 2020-04-19 ENCOUNTER — Ambulatory Visit (INDEPENDENT_AMBULATORY_CARE_PROVIDER_SITE_OTHER): Payer: PPO

## 2020-04-19 DIAGNOSIS — I63412 Cerebral infarction due to embolism of left middle cerebral artery: Secondary | ICD-10-CM

## 2020-04-20 DIAGNOSIS — R531 Weakness: Secondary | ICD-10-CM | POA: Diagnosis not present

## 2020-04-20 DIAGNOSIS — R2689 Other abnormalities of gait and mobility: Secondary | ICD-10-CM | POA: Diagnosis not present

## 2020-04-22 LAB — CUP PACEART REMOTE DEVICE CHECK
Date Time Interrogation Session: 20211020022243
Implantable Pulse Generator Implant Date: 20190313

## 2020-04-25 NOTE — Progress Notes (Signed)
Carelink Summary Report / Loop Recorder 

## 2020-05-17 DIAGNOSIS — E119 Type 2 diabetes mellitus without complications: Secondary | ICD-10-CM | POA: Diagnosis not present

## 2020-05-22 ENCOUNTER — Ambulatory Visit (INDEPENDENT_AMBULATORY_CARE_PROVIDER_SITE_OTHER): Payer: PPO

## 2020-05-22 DIAGNOSIS — I63412 Cerebral infarction due to embolism of left middle cerebral artery: Secondary | ICD-10-CM

## 2020-05-22 LAB — CUP PACEART REMOTE DEVICE CHECK
Date Time Interrogation Session: 20211120013604
Implantable Pulse Generator Implant Date: 20190313

## 2020-05-24 NOTE — Progress Notes (Signed)
Carelink Summary Report / Loop Recorder 

## 2020-06-20 LAB — CUP PACEART REMOTE DEVICE CHECK
Date Time Interrogation Session: 20211221013741
Implantable Pulse Generator Implant Date: 20190313

## 2020-06-26 ENCOUNTER — Ambulatory Visit (INDEPENDENT_AMBULATORY_CARE_PROVIDER_SITE_OTHER): Payer: PPO

## 2020-06-26 DIAGNOSIS — I63412 Cerebral infarction due to embolism of left middle cerebral artery: Secondary | ICD-10-CM | POA: Diagnosis not present

## 2020-07-10 NOTE — Progress Notes (Signed)
Carelink Summary Report / Loop Recorder 

## 2020-07-14 DIAGNOSIS — E785 Hyperlipidemia, unspecified: Secondary | ICD-10-CM | POA: Diagnosis not present

## 2020-07-14 DIAGNOSIS — Z5181 Encounter for therapeutic drug level monitoring: Secondary | ICD-10-CM | POA: Diagnosis not present

## 2020-07-14 DIAGNOSIS — D649 Anemia, unspecified: Secondary | ICD-10-CM | POA: Diagnosis not present

## 2020-07-14 DIAGNOSIS — E1122 Type 2 diabetes mellitus with diabetic chronic kidney disease: Secondary | ICD-10-CM | POA: Diagnosis not present

## 2020-07-14 DIAGNOSIS — E039 Hypothyroidism, unspecified: Secondary | ICD-10-CM | POA: Diagnosis not present

## 2020-07-20 DIAGNOSIS — N183 Chronic kidney disease, stage 3 unspecified: Secondary | ICD-10-CM | POA: Diagnosis not present

## 2020-07-20 DIAGNOSIS — E039 Hypothyroidism, unspecified: Secondary | ICD-10-CM | POA: Diagnosis not present

## 2020-07-20 DIAGNOSIS — Z7984 Long term (current) use of oral hypoglycemic drugs: Secondary | ICD-10-CM | POA: Diagnosis not present

## 2020-07-20 DIAGNOSIS — Z5181 Encounter for therapeutic drug level monitoring: Secondary | ICD-10-CM | POA: Diagnosis not present

## 2020-07-20 DIAGNOSIS — E785 Hyperlipidemia, unspecified: Secondary | ICD-10-CM | POA: Diagnosis not present

## 2020-07-20 DIAGNOSIS — R0982 Postnasal drip: Secondary | ICD-10-CM | POA: Diagnosis not present

## 2020-07-20 DIAGNOSIS — E1122 Type 2 diabetes mellitus with diabetic chronic kidney disease: Secondary | ICD-10-CM | POA: Diagnosis not present

## 2020-07-31 ENCOUNTER — Ambulatory Visit (INDEPENDENT_AMBULATORY_CARE_PROVIDER_SITE_OTHER): Payer: PPO

## 2020-07-31 DIAGNOSIS — I63412 Cerebral infarction due to embolism of left middle cerebral artery: Secondary | ICD-10-CM

## 2020-07-31 DIAGNOSIS — R413 Other amnesia: Secondary | ICD-10-CM | POA: Diagnosis not present

## 2020-07-31 DIAGNOSIS — Z8673 Personal history of transient ischemic attack (TIA), and cerebral infarction without residual deficits: Secondary | ICD-10-CM | POA: Diagnosis not present

## 2020-07-31 DIAGNOSIS — R251 Tremor, unspecified: Secondary | ICD-10-CM | POA: Diagnosis not present

## 2020-07-31 LAB — CUP PACEART REMOTE DEVICE CHECK
Date Time Interrogation Session: 20220129231338
Implantable Pulse Generator Implant Date: 20190313

## 2020-08-09 NOTE — Progress Notes (Signed)
Carelink Summary Report / Loop Recorder 

## 2020-09-04 ENCOUNTER — Ambulatory Visit (INDEPENDENT_AMBULATORY_CARE_PROVIDER_SITE_OTHER): Payer: PPO

## 2020-09-04 DIAGNOSIS — I63412 Cerebral infarction due to embolism of left middle cerebral artery: Secondary | ICD-10-CM

## 2020-09-06 LAB — CUP PACEART REMOTE DEVICE CHECK
Date Time Interrogation Session: 20220301231751
Implantable Pulse Generator Implant Date: 20190313

## 2020-09-08 DIAGNOSIS — N1832 Chronic kidney disease, stage 3b: Secondary | ICD-10-CM | POA: Diagnosis not present

## 2020-09-08 DIAGNOSIS — D631 Anemia in chronic kidney disease: Secondary | ICD-10-CM | POA: Diagnosis not present

## 2020-09-08 DIAGNOSIS — I739 Peripheral vascular disease, unspecified: Secondary | ICD-10-CM | POA: Diagnosis not present

## 2020-09-08 DIAGNOSIS — E1122 Type 2 diabetes mellitus with diabetic chronic kidney disease: Secondary | ICD-10-CM | POA: Diagnosis not present

## 2020-09-08 DIAGNOSIS — N2581 Secondary hyperparathyroidism of renal origin: Secondary | ICD-10-CM | POA: Diagnosis not present

## 2020-09-08 DIAGNOSIS — N400 Enlarged prostate without lower urinary tract symptoms: Secondary | ICD-10-CM | POA: Diagnosis not present

## 2020-09-13 NOTE — Progress Notes (Signed)
Carelink Summary Report / Loop Recorder 

## 2020-09-14 ENCOUNTER — Other Ambulatory Visit: Payer: Self-pay | Admitting: Nephrology

## 2020-09-14 DIAGNOSIS — N183 Chronic kidney disease, stage 3 unspecified: Secondary | ICD-10-CM

## 2020-09-19 ENCOUNTER — Telehealth: Payer: Self-pay | Admitting: Internal Medicine

## 2020-09-19 NOTE — Telephone Encounter (Signed)
Patients ILR is still working. Last transmission received was 08/29/20. When the device reaches RRT we will call.   Attempted to call to advise. No answer, LMOVM.

## 2020-09-19 NOTE — Telephone Encounter (Signed)
Spoke to patients wife [name] [name]). Advised about battery. Advised to call with further questions or concerns.

## 2020-09-19 NOTE — Telephone Encounter (Signed)
Patient's wife is returning call. 

## 2020-09-19 NOTE — Telephone Encounter (Signed)
Patient's wife would like to know when the patient needs to have his ILR removed. She states it has been about 3 years and she assumes his monitoring cycle is complete. Please advise.

## 2020-09-25 ENCOUNTER — Ambulatory Visit
Admission: RE | Admit: 2020-09-25 | Discharge: 2020-09-25 | Disposition: A | Discharge status: Home / Self Care | Payer: PPO | Source: Ambulatory Visit | Attending: Nephrology | Admitting: Nephrology

## 2020-09-25 DIAGNOSIS — N281 Cyst of kidney, acquired: Secondary | ICD-10-CM | POA: Diagnosis not present

## 2020-09-25 DIAGNOSIS — E1122 Type 2 diabetes mellitus with diabetic chronic kidney disease: Secondary | ICD-10-CM | POA: Diagnosis not present

## 2020-09-25 DIAGNOSIS — N183 Chronic kidney disease, stage 3 unspecified: Secondary | ICD-10-CM

## 2020-09-25 DIAGNOSIS — Z7984 Long term (current) use of oral hypoglycemic drugs: Secondary | ICD-10-CM | POA: Diagnosis not present

## 2020-09-25 DIAGNOSIS — N1832 Chronic kidney disease, stage 3b: Secondary | ICD-10-CM | POA: Diagnosis not present

## 2020-09-25 DIAGNOSIS — E039 Hypothyroidism, unspecified: Secondary | ICD-10-CM | POA: Diagnosis not present

## 2020-10-07 LAB — CUP PACEART REMOTE DEVICE CHECK
Date Time Interrogation Session: 20220402001936
Implantable Pulse Generator Implant Date: 20190313

## 2020-10-09 ENCOUNTER — Ambulatory Visit (INDEPENDENT_AMBULATORY_CARE_PROVIDER_SITE_OTHER): Payer: PPO

## 2020-10-09 DIAGNOSIS — I63412 Cerebral infarction due to embolism of left middle cerebral artery: Secondary | ICD-10-CM | POA: Diagnosis not present

## 2020-10-10 ENCOUNTER — Other Ambulatory Visit: Payer: Self-pay | Admitting: Nephrology

## 2020-10-10 DIAGNOSIS — N281 Cyst of kidney, acquired: Secondary | ICD-10-CM

## 2020-10-18 ENCOUNTER — Other Ambulatory Visit: Payer: Self-pay

## 2020-10-18 ENCOUNTER — Ambulatory Visit
Admission: RE | Admit: 2020-10-18 | Discharge: 2020-10-18 | Disposition: A | Discharge status: Home / Self Care | Payer: PPO | Source: Ambulatory Visit | Attending: Nephrology | Admitting: Nephrology

## 2020-10-18 DIAGNOSIS — N189 Chronic kidney disease, unspecified: Secondary | ICD-10-CM | POA: Diagnosis not present

## 2020-10-18 DIAGNOSIS — N3289 Other specified disorders of bladder: Secondary | ICD-10-CM | POA: Diagnosis not present

## 2020-10-18 DIAGNOSIS — N281 Cyst of kidney, acquired: Secondary | ICD-10-CM | POA: Diagnosis not present

## 2020-10-18 MED ORDER — GADOBENATE DIMEGLUMINE 529 MG/ML IV SOLN
11.0000 mL | Freq: Once | INTRAVENOUS | Status: AC | PRN
  Administered 2020-10-18: 11 mL via INTRAVENOUS

## 2020-10-24 NOTE — Progress Notes (Signed)
Carelink Summary Report / Loop Recorder 

## 2020-10-26 ENCOUNTER — Other Ambulatory Visit: Payer: PPO

## 2020-11-13 ENCOUNTER — Ambulatory Visit (INDEPENDENT_AMBULATORY_CARE_PROVIDER_SITE_OTHER): Payer: PPO

## 2020-11-13 DIAGNOSIS — I63412 Cerebral infarction due to embolism of left middle cerebral artery: Secondary | ICD-10-CM

## 2020-11-14 LAB — CUP PACEART REMOTE DEVICE CHECK
Date Time Interrogation Session: 20220514231105
Implantable Pulse Generator Implant Date: 20190313

## 2020-11-21 DIAGNOSIS — E119 Type 2 diabetes mellitus without complications: Secondary | ICD-10-CM | POA: Diagnosis not present

## 2020-11-30 DIAGNOSIS — H6121 Impacted cerumen, right ear: Secondary | ICD-10-CM | POA: Insufficient documentation

## 2020-11-30 DIAGNOSIS — H903 Sensorineural hearing loss, bilateral: Secondary | ICD-10-CM | POA: Diagnosis not present

## 2020-11-30 DIAGNOSIS — Z974 Presence of external hearing-aid: Secondary | ICD-10-CM | POA: Diagnosis not present

## 2020-12-06 NOTE — Progress Notes (Signed)
Carelink Summary Report / Loop Recorder 

## 2020-12-18 ENCOUNTER — Ambulatory Visit (INDEPENDENT_AMBULATORY_CARE_PROVIDER_SITE_OTHER): Payer: PPO

## 2020-12-18 DIAGNOSIS — I63412 Cerebral infarction due to embolism of left middle cerebral artery: Secondary | ICD-10-CM | POA: Diagnosis not present

## 2020-12-18 DIAGNOSIS — M79671 Pain in right foot: Secondary | ICD-10-CM | POA: Diagnosis not present

## 2020-12-18 LAB — CUP PACEART REMOTE DEVICE CHECK
Date Time Interrogation Session: 20220614232856
Implantable Pulse Generator Implant Date: 20190313

## 2020-12-19 ENCOUNTER — Telehealth: Payer: Self-pay | Admitting: Emergency Medicine

## 2020-12-19 NOTE — Telephone Encounter (Signed)
Received an alert this morning for 12/12/20. Patient showing a bigeminal rhythm. Sending to Dr. Debby Bud.       Today's presenting rhythm 12/19/20.

## 2020-12-22 DIAGNOSIS — N2581 Secondary hyperparathyroidism of renal origin: Secondary | ICD-10-CM | POA: Diagnosis not present

## 2020-12-22 DIAGNOSIS — I129 Hypertensive chronic kidney disease with stage 1 through stage 4 chronic kidney disease, or unspecified chronic kidney disease: Secondary | ICD-10-CM | POA: Diagnosis not present

## 2020-12-22 DIAGNOSIS — E1122 Type 2 diabetes mellitus with diabetic chronic kidney disease: Secondary | ICD-10-CM | POA: Diagnosis not present

## 2020-12-22 DIAGNOSIS — D631 Anemia in chronic kidney disease: Secondary | ICD-10-CM | POA: Diagnosis not present

## 2020-12-22 DIAGNOSIS — N281 Cyst of kidney, acquired: Secondary | ICD-10-CM | POA: Diagnosis not present

## 2020-12-22 DIAGNOSIS — N1832 Chronic kidney disease, stage 3b: Secondary | ICD-10-CM | POA: Diagnosis not present

## 2021-01-08 NOTE — Progress Notes (Signed)
Carelink Summary Report / Loop Recorder 

## 2021-01-11 DIAGNOSIS — L821 Other seborrheic keratosis: Secondary | ICD-10-CM | POA: Diagnosis not present

## 2021-01-11 DIAGNOSIS — D692 Other nonthrombocytopenic purpura: Secondary | ICD-10-CM | POA: Diagnosis not present

## 2021-01-11 DIAGNOSIS — L570 Actinic keratosis: Secondary | ICD-10-CM | POA: Diagnosis not present

## 2021-01-18 LAB — CUP PACEART REMOTE DEVICE CHECK
Date Time Interrogation Session: 20220716002652
Implantable Pulse Generator Implant Date: 20190313

## 2021-01-22 ENCOUNTER — Ambulatory Visit (INDEPENDENT_AMBULATORY_CARE_PROVIDER_SITE_OTHER): Payer: PPO

## 2021-01-22 DIAGNOSIS — M79671 Pain in right foot: Secondary | ICD-10-CM | POA: Diagnosis not present

## 2021-01-22 DIAGNOSIS — L84 Corns and callosities: Secondary | ICD-10-CM | POA: Insufficient documentation

## 2021-01-22 DIAGNOSIS — I63412 Cerebral infarction due to embolism of left middle cerebral artery: Secondary | ICD-10-CM | POA: Diagnosis not present

## 2021-02-09 DIAGNOSIS — Z7984 Long term (current) use of oral hypoglycemic drugs: Secondary | ICD-10-CM | POA: Diagnosis not present

## 2021-02-09 DIAGNOSIS — E039 Hypothyroidism, unspecified: Secondary | ICD-10-CM | POA: Diagnosis not present

## 2021-02-09 DIAGNOSIS — E785 Hyperlipidemia, unspecified: Secondary | ICD-10-CM | POA: Diagnosis not present

## 2021-02-09 DIAGNOSIS — Z5181 Encounter for therapeutic drug level monitoring: Secondary | ICD-10-CM | POA: Diagnosis not present

## 2021-02-09 DIAGNOSIS — E1122 Type 2 diabetes mellitus with diabetic chronic kidney disease: Secondary | ICD-10-CM | POA: Diagnosis not present

## 2021-02-09 DIAGNOSIS — N183 Chronic kidney disease, stage 3 unspecified: Secondary | ICD-10-CM | POA: Diagnosis not present

## 2021-02-13 ENCOUNTER — Ambulatory Visit (INDEPENDENT_AMBULATORY_CARE_PROVIDER_SITE_OTHER): Payer: PPO

## 2021-02-13 DIAGNOSIS — I63412 Cerebral infarction due to embolism of left middle cerebral artery: Secondary | ICD-10-CM | POA: Diagnosis not present

## 2021-02-13 DIAGNOSIS — D509 Iron deficiency anemia, unspecified: Secondary | ICD-10-CM | POA: Diagnosis not present

## 2021-02-13 DIAGNOSIS — E1122 Type 2 diabetes mellitus with diabetic chronic kidney disease: Secondary | ICD-10-CM | POA: Diagnosis not present

## 2021-02-13 DIAGNOSIS — E785 Hyperlipidemia, unspecified: Secondary | ICD-10-CM | POA: Diagnosis not present

## 2021-02-13 DIAGNOSIS — I7771 Dissection of carotid artery: Secondary | ICD-10-CM | POA: Diagnosis not present

## 2021-02-13 DIAGNOSIS — N183 Chronic kidney disease, stage 3 unspecified: Secondary | ICD-10-CM | POA: Diagnosis not present

## 2021-02-13 DIAGNOSIS — F330 Major depressive disorder, recurrent, mild: Secondary | ICD-10-CM | POA: Diagnosis not present

## 2021-02-13 DIAGNOSIS — I714 Abdominal aortic aneurysm, without rupture: Secondary | ICD-10-CM | POA: Diagnosis not present

## 2021-02-13 DIAGNOSIS — G3184 Mild cognitive impairment, so stated: Secondary | ICD-10-CM | POA: Diagnosis not present

## 2021-02-13 DIAGNOSIS — I639 Cerebral infarction, unspecified: Secondary | ICD-10-CM | POA: Diagnosis not present

## 2021-02-13 DIAGNOSIS — Z0000 Encounter for general adult medical examination without abnormal findings: Secondary | ICD-10-CM | POA: Diagnosis not present

## 2021-02-13 DIAGNOSIS — E039 Hypothyroidism, unspecified: Secondary | ICD-10-CM | POA: Diagnosis not present

## 2021-02-13 DIAGNOSIS — N1831 Chronic kidney disease, stage 3a: Secondary | ICD-10-CM | POA: Diagnosis not present

## 2021-02-13 LAB — CUP PACEART REMOTE DEVICE CHECK
Date Time Interrogation Session: 20220816011451
Implantable Pulse Generator Implant Date: 20190313

## 2021-02-16 NOTE — Progress Notes (Signed)
Carelink Summary Report / Loop Recorder 

## 2021-03-06 NOTE — Progress Notes (Signed)
Carelink Summary Report / Loop Recorder 

## 2021-03-16 DIAGNOSIS — R300 Dysuria: Secondary | ICD-10-CM | POA: Diagnosis not present

## 2021-03-16 DIAGNOSIS — R350 Frequency of micturition: Secondary | ICD-10-CM | POA: Diagnosis not present

## 2021-03-19 ENCOUNTER — Ambulatory Visit (INDEPENDENT_AMBULATORY_CARE_PROVIDER_SITE_OTHER): Payer: PPO

## 2021-03-19 DIAGNOSIS — I63412 Cerebral infarction due to embolism of left middle cerebral artery: Secondary | ICD-10-CM

## 2021-03-20 LAB — CUP PACEART REMOTE DEVICE CHECK
Date Time Interrogation Session: 20220916011839
Implantable Pulse Generator Implant Date: 20190313

## 2021-03-26 DIAGNOSIS — D509 Iron deficiency anemia, unspecified: Secondary | ICD-10-CM | POA: Diagnosis not present

## 2021-03-26 NOTE — Progress Notes (Signed)
Carelink Summary Report / Loop Recorder 

## 2021-03-29 DIAGNOSIS — R630 Anorexia: Secondary | ICD-10-CM | POA: Diagnosis not present

## 2021-03-29 DIAGNOSIS — E1122 Type 2 diabetes mellitus with diabetic chronic kidney disease: Secondary | ICD-10-CM | POA: Diagnosis not present

## 2021-03-29 DIAGNOSIS — E039 Hypothyroidism, unspecified: Secondary | ICD-10-CM | POA: Diagnosis not present

## 2021-03-29 DIAGNOSIS — N1832 Chronic kidney disease, stage 3b: Secondary | ICD-10-CM | POA: Diagnosis not present

## 2021-03-29 DIAGNOSIS — D509 Iron deficiency anemia, unspecified: Secondary | ICD-10-CM | POA: Diagnosis not present

## 2021-03-29 DIAGNOSIS — N529 Male erectile dysfunction, unspecified: Secondary | ICD-10-CM | POA: Diagnosis not present

## 2021-03-29 DIAGNOSIS — Z7984 Long term (current) use of oral hypoglycemic drugs: Secondary | ICD-10-CM | POA: Diagnosis not present

## 2021-04-03 ENCOUNTER — Other Ambulatory Visit: Payer: Self-pay | Admitting: Physician Assistant

## 2021-04-03 DIAGNOSIS — Z8673 Personal history of transient ischemic attack (TIA), and cerebral infarction without residual deficits: Secondary | ICD-10-CM | POA: Diagnosis not present

## 2021-04-03 DIAGNOSIS — Z86010 Personal history of colonic polyps: Secondary | ICD-10-CM | POA: Diagnosis not present

## 2021-04-03 DIAGNOSIS — D509 Iron deficiency anemia, unspecified: Secondary | ICD-10-CM | POA: Diagnosis not present

## 2021-04-03 DIAGNOSIS — D508 Other iron deficiency anemias: Secondary | ICD-10-CM

## 2021-04-03 DIAGNOSIS — K5900 Constipation, unspecified: Secondary | ICD-10-CM | POA: Diagnosis not present

## 2021-04-03 DIAGNOSIS — K219 Gastro-esophageal reflux disease without esophagitis: Secondary | ICD-10-CM | POA: Diagnosis not present

## 2021-04-03 DIAGNOSIS — N1832 Chronic kidney disease, stage 3b: Secondary | ICD-10-CM | POA: Diagnosis not present

## 2021-04-05 DIAGNOSIS — Z961 Presence of intraocular lens: Secondary | ICD-10-CM | POA: Diagnosis not present

## 2021-04-05 DIAGNOSIS — M9901 Segmental and somatic dysfunction of cervical region: Secondary | ICD-10-CM | POA: Diagnosis not present

## 2021-04-05 DIAGNOSIS — E119 Type 2 diabetes mellitus without complications: Secondary | ICD-10-CM | POA: Diagnosis not present

## 2021-04-05 DIAGNOSIS — M5031 Other cervical disc degeneration,  high cervical region: Secondary | ICD-10-CM | POA: Diagnosis not present

## 2021-04-09 DIAGNOSIS — M5031 Other cervical disc degeneration,  high cervical region: Secondary | ICD-10-CM | POA: Diagnosis not present

## 2021-04-09 DIAGNOSIS — M9901 Segmental and somatic dysfunction of cervical region: Secondary | ICD-10-CM | POA: Diagnosis not present

## 2021-04-11 ENCOUNTER — Ambulatory Visit
Admission: RE | Admit: 2021-04-11 | Discharge: 2021-04-11 | Disposition: A | Discharge status: Home / Self Care | Payer: PPO | Source: Ambulatory Visit | Attending: Physician Assistant | Admitting: Physician Assistant

## 2021-04-11 DIAGNOSIS — D508 Other iron deficiency anemias: Secondary | ICD-10-CM

## 2021-04-11 DIAGNOSIS — D509 Iron deficiency anemia, unspecified: Secondary | ICD-10-CM | POA: Diagnosis not present

## 2021-04-11 DIAGNOSIS — K224 Dyskinesia of esophagus: Secondary | ICD-10-CM | POA: Diagnosis not present

## 2021-04-12 DIAGNOSIS — M9901 Segmental and somatic dysfunction of cervical region: Secondary | ICD-10-CM | POA: Diagnosis not present

## 2021-04-12 DIAGNOSIS — M5031 Other cervical disc degeneration,  high cervical region: Secondary | ICD-10-CM | POA: Diagnosis not present

## 2021-04-16 DIAGNOSIS — M5031 Other cervical disc degeneration,  high cervical region: Secondary | ICD-10-CM | POA: Diagnosis not present

## 2021-04-16 DIAGNOSIS — M9901 Segmental and somatic dysfunction of cervical region: Secondary | ICD-10-CM | POA: Diagnosis not present

## 2021-04-19 ENCOUNTER — Telehealth: Payer: Self-pay | Admitting: *Deleted

## 2021-04-19 DIAGNOSIS — M9901 Segmental and somatic dysfunction of cervical region: Secondary | ICD-10-CM | POA: Diagnosis not present

## 2021-04-19 DIAGNOSIS — M5031 Other cervical disc degeneration,  high cervical region: Secondary | ICD-10-CM | POA: Diagnosis not present

## 2021-04-19 NOTE — Telephone Encounter (Signed)
Pt has been scheduled to see Dr. Radford Pax, 04/24/2021, and clearance will be addressed at that time.  Will route back to the requesting surgeon's office to make them aware.

## 2021-04-19 NOTE — Telephone Encounter (Signed)
   [facility] Pre-operative Risk Assessment    Patient Name: ADITHYA DIFRANCESCO  DOB: [birth year] MRN: [id number]  HEARTCARE STAFF:  - IMPORTANT!!!!!! Under Visit Info/Reason for Call, type in Other and utilize the format Clearance MM/DD/YY or Clearance TBD. Do not use dashes or single digits. - Please review there is not already an duplicate clearance open for this procedure. - If request is for dental extraction, please clarify the # of teeth to be extracted. - If the patient is currently at the dentist's office, call Pre-Op Callback Staff (MA/nurse) to input urgent request.  - If the patient is not currently in the dentist office, please route to the Pre-Op pool.  Request for surgical clearance:  What type of surgery is being performed?  COLONOSCOPY / ENDOSCOPY   When is this surgery scheduled?  TBD  What type of clearance is required (medical clearance vs. Pharmacy clearance to hold med vs. Both)?  MEDICAL  Are there any medications that need to be held prior to surgery and how long?  N/A  Practice name and name of physician performing surgery?  [facility]   What is the office phone number?  [phone]   7.   What is the office fax number?  [phone]  8.   Anesthesia type (None, local, MAC, general) ?     Jeanann Lewandowsky 04/19/2021, 7:00 AM  _________________________________________________________________   (provider comments below)

## 2021-04-19 NOTE — Telephone Encounter (Signed)
Primary Cardiologist:Traci Turner, MD  Chart reviewed as part of pre-operative protocol coverage. Because of [name] [name]'s past medical history and time since last visit, he/she will require a follow-up visit in order to better assess preoperative cardiovascular risk.  Pre-op covering staff: - Please schedule appointment and call patient to inform them. - Please contact requesting surgeon's office via preferred method (i.e, phone, fax) to inform them of need for appointment prior to surgery.  If applicable, this message will also be routed to pharmacy pool and/or primary cardiologist for input on holding anticoagulant/antiplatelet agent as requested below so that this information is available at time of patient's appointment.   [name], NP  04/19/2021, 7:30 AM

## 2021-04-20 DIAGNOSIS — N281 Cyst of kidney, acquired: Secondary | ICD-10-CM | POA: Diagnosis not present

## 2021-04-20 DIAGNOSIS — N189 Chronic kidney disease, unspecified: Secondary | ICD-10-CM | POA: Diagnosis not present

## 2021-04-20 DIAGNOSIS — N1832 Chronic kidney disease, stage 3b: Secondary | ICD-10-CM | POA: Diagnosis not present

## 2021-04-20 DIAGNOSIS — D631 Anemia in chronic kidney disease: Secondary | ICD-10-CM | POA: Diagnosis not present

## 2021-04-20 DIAGNOSIS — I129 Hypertensive chronic kidney disease with stage 1 through stage 4 chronic kidney disease, or unspecified chronic kidney disease: Secondary | ICD-10-CM | POA: Diagnosis not present

## 2021-04-20 DIAGNOSIS — N2581 Secondary hyperparathyroidism of renal origin: Secondary | ICD-10-CM | POA: Diagnosis not present

## 2021-04-20 DIAGNOSIS — E1122 Type 2 diabetes mellitus with diabetic chronic kidney disease: Secondary | ICD-10-CM | POA: Diagnosis not present

## 2021-04-23 ENCOUNTER — Ambulatory Visit (INDEPENDENT_AMBULATORY_CARE_PROVIDER_SITE_OTHER): Payer: PPO

## 2021-04-23 DIAGNOSIS — I63412 Cerebral infarction due to embolism of left middle cerebral artery: Secondary | ICD-10-CM | POA: Diagnosis not present

## 2021-04-23 LAB — CUP PACEART REMOTE DEVICE CHECK
Date Time Interrogation Session: 20221017012235
Implantable Pulse Generator Implant Date: 20190313

## 2021-04-24 ENCOUNTER — Other Ambulatory Visit: Payer: Self-pay

## 2021-04-24 ENCOUNTER — Encounter: Payer: Self-pay | Admitting: Cardiology

## 2021-04-24 ENCOUNTER — Ambulatory Visit: Payer: PPO | Admitting: Cardiology

## 2021-04-24 VITALS — BP 140/72 | HR 60 | Ht 63.0 in | Wt 126.2 lb

## 2021-04-24 DIAGNOSIS — I4510 Unspecified right bundle-branch block: Secondary | ICD-10-CM | POA: Diagnosis not present

## 2021-04-24 DIAGNOSIS — Z01818 Encounter for other preprocedural examination: Secondary | ICD-10-CM

## 2021-04-24 DIAGNOSIS — E7800 Pure hypercholesterolemia, unspecified: Secondary | ICD-10-CM

## 2021-04-24 DIAGNOSIS — Z01810 Encounter for preprocedural cardiovascular examination: Secondary | ICD-10-CM | POA: Diagnosis not present

## 2021-04-24 NOTE — Addendum Note (Signed)
Addended by: Fransico Him R on: 04/24/2021 09:07 AM   Modules accepted: Orders

## 2021-04-24 NOTE — Patient Instructions (Signed)
Medication Instructions:  Your physician recommends that you continue on your current medications as directed. Please refer to the Current Medication list given to you today.  *If you need a refill on your cardiac medications before your next appointment, please call your pharmacy*   Testing/Procedures: Your physician has requested that you have an echocardiogram. Echocardiography is a painless test that uses sound waves to create images of your heart. It provides your doctor with information about the size and shape of your heart and how well your heart's chambers and valves are working. This procedure takes approximately one hour. There are no restrictions for this procedure.  Your physician has requested that you have a lexiscan myoview. For further information please visit [email or web address]. Please follow instruction sheet, as given.  Follow-Up: At [facility], you and your health needs are our priority.  As part of our continuing mission to provide you with exceptional heart care, we have created designated Provider Care Teams.  These Care Teams include your primary Cardiologist (physician) and Advanced Practice Providers (APPs -  Physician Assistants and Nurse Practitioners) who all work together to provide you with the care you need, when you need it.  Follow up with Dr. Turner as needed based on results of testing.   

## 2021-04-24 NOTE — Addendum Note (Signed)
Addended by: Antonieta Iba on: 04/24/2021 08:58 AM   Modules accepted: Orders

## 2021-04-24 NOTE — Progress Notes (Signed)
Cardiology CONSULT Note    Date:  04/24/2021   ID:  [name] [name], DOB December 19, 1932, MRN [id number]  PCP:  [name], MD  Cardiologist:  Fransico Him, MD   Chief Complaint  Patient presents with   New Patient (Initial Visit)    Preop clearance for endocscopy    History of Present Illness:  [name] [name] is a 87 y.o. male who is being seen today for the evaluation of Preoperative cardiac clearance at the request of [name], MD.  This is an 87yo male with a hx of AAA, s/p repair celiac artery aneurysm, DM, HLD and prior CVA.  At the time of his CVA he had a TEE that was negative for source of emboli.  ILR was ultimately implanted with no arrhythmias. He has a colonoscopy scheduled and is here for clearance to hold Plavix which he is on for his hx of CVA.  He also has a RBBB that was noted to be new in 2019.  He is doing well today.  He denies any chest pain or pressure, SOB, DOE, PND, orthopnea, LE edema, palpitations or syncope.   Past Medical History:  Diagnosis Date   AAA (abdominal aortic aneurysm)    Celiac artery aneurysm ([facility])    Diabetes mellitus without complication ([place])    Hiatal hernia    HOH (hard of hearing)    Hyperlipidemia    Stroke [facility])     Past Surgical History:  Procedure Laterality Date   ABDOMINAL AORTIC ANEURYSM REPAIR  10-2003   also had iliac aneurysm repair   CHOLECYSTECTOMY     Laparoscopic cholecystectomy   EYE SURGERY Bilateral    cataracts   HERNIA REPAIR     LOOP RECORDER INSERTION N/A 09/10/2017   Procedure: LOOP RECORDER INSERTION;  Surgeon: Deboraha Sprang, MD;  Location: [place] [facility];  Service: Cardiovascular;  Laterality: N/A;   TEE WITHOUT CARDIOVERSION N/A 09/10/2017   Procedure: TRANSESOPHAGEAL ECHOCARDIOGRAM (TEE);  Surgeon: Jerline Pain, MD;  Location: [facility] ENDOSCOPY;  Service: Cardiovascular;  Laterality: N/A;    Current Medications: Current Meds  Medication Sig   ALPRAZolam (XANAX) 0.25 MG tablet  Take 0.25 mg by mouth at bedtime as needed for anxiety (1/2 to 1 tablet once daily as needed).   cholecalciferol (VITAMIN D) 1000 units tablet Take 1,000 Units by mouth daily with supper.   clopidogrel (PLAVIX) 75 MG tablet Take 1 tablet (75 mg total) by mouth daily.   Coenzyme Q10 (COQ10) 100 MG CAPS Take 100 mg by mouth daily.   diazepam (VALIUM) 5 MG tablet Take 2.5-5 mg by mouth at bedtime as needed for anxiety (restless legs).   levothyroxine (SYNTHROID, LEVOTHROID) 75 MCG tablet Take 75 mcg by mouth daily before breakfast.    loratadine (CLARITIN) 10 MG tablet Take 10 mg by mouth daily.   Multiple Vitamin (MULTIVITAMIN WITH MINERALS) TABS tablet Take 1 tablet by mouth daily.   Omega-3 1000 MG CAPS Take 1,000 mg by mouth daily.   pantoprazole (PROTONIX) 40 MG tablet Take 1 tablet (40 mg total) by mouth 2 (two) times daily before a meal.   rosuvastatin (CRESTOR) 10 MG tablet Take 10 mg by mouth at bedtime.    vitamin B-12 (CYANOCOBALAMIN) 1000 MCG tablet Take 1,000 mcg by mouth daily.    Allergies:   Actos [pioglitazone], Allegra [fexofenadine], Iodine, Metformin, Metformin and related, Onglyza [saxagliptin], and Shellfish allergy   Social History   Socioeconomic History   Marital status: Married  Spouse name: Not on file   Number of children: Not on file   Years of education: Not on file   Highest education level: Not on file  Occupational History   Not on file  Tobacco Use   Smoking status: Never   Smokeless tobacco: Never  Vaping Use   Vaping Use: Never used  Substance and Sexual Activity   Alcohol use: No   Drug use: No   Sexual activity: Not on file  Other Topics Concern   Not on file  Social History Narrative   Not on file   Social Determinants of Health   Financial Resource Strain: Not on file  Food Insecurity: Not on file  Transportation Needs: Not on file  Physical Activity: Not on file  Stress: Not on file  Social Connections: Not on file     Family  History:  The patient's family history includes Arthritis in his mother.   ROS:   Please see the history of present illness.    ROS All other systems reviewed and are negative.  No flowsheet data found.     PHYSICAL EXAM:   VS:  BP 140/72   Pulse 60   Ht 5\' 3"  (1.6 m)   Wt 126 lb 3.2 oz (57.2 kg)   SpO2 97%   BMI 22.36 kg/m    GEN: Well nourished, well developed, in no acute distress  HEENT: normal  Neck: no JVD, carotid bruits, or masses Cardiac: RRR; no murmurs, rubs, or gallops,no edema.  Intact distal pulses bilaterally.  Respiratory:  clear to auscultation bilaterally, normal work of breathing GI: soft, nontender, nondistended, + BS MS: no deformity or atrophy  Skin: warm and dry, no rash Neuro:  Alert and Oriented x 3, Strength and sensation are intact Psych: euthymic mood, full affect  Wt Readings from Last 3 Encounters:  04/24/21 126 lb 3.2 oz (57.2 kg)  01/14/19 127 lb (57.6 kg)  08/18/18 132 lb 6.4 oz (60.1 kg)      Studies/Labs Reviewed:   EKG:  EKG is ordered today.  The ekg ordered today demonstrates NSR with RBBB  Recent Labs: No results found for requested labs within last 8760 hours.   Lipid Panel    Component Value Date/Time   CHOL 127 09/10/2017 0602   TRIG 42 09/10/2017 0602   HDL 45 09/10/2017 0602   CHOLHDL 2.8 09/10/2017 0602   VLDL 8 09/10/2017 0602   LDLCALC 74 09/10/2017 0602   Additional studies/ records that were reviewed today include:  OV notes and EKG from PCP    ASSESSMENT:    1. RBBB   2. Preoperative clearance     PLAN:  In order of problems listed above:  Preop clearance -this is for EGD and colonoscopy for workup of anemia -clearance was for recommendations on holding Plavix -He is on Plavix for prior CVA and this needs to be cleared through his PCP  2.  RBBB -this is new since 2019 -he is asymptomatic -I will get a Lexiscan myoview to rule out ischemia -Shared Decision Making/Informed Consent The risks  [chest pain, shortness of breath, cardiac arrhythmias, dizziness, blood pressure fluctuations, myocardial infarction, stroke/transient ischemic attack, nausea, vomiting, allergic reaction, radiation exposure, metallic taste sensation and life-threatening complications (estimated to be 1 in 10,000)], benefits (risk stratification, diagnosing coronary artery disease, treatment guidance) and alternatives of a nuclear stress test were discussed in detail with [name] [name] and he agrees to proceed. -I  will also get a 2D echo  to assess LVF   Time Spent: 20 minutes total time of encounter, including 15 minutes spent in face-to-face patient care on the date of this encounter. This time includes coordination of care and counseling regarding above mentioned problem list. Remainder of non-face-to-face time involved reviewing chart documents/testing relevant to the patient encounter and documentation in the medical record. I have independently reviewed documentation from referring provider  Medication Adjustments/Labs and Tests Ordered: Current medicines are reviewed at length with the patient today.  Concerns regarding medicines are outlined above.  Medication changes, Labs and Tests ordered today are listed in the Patient Instructions below.  There are no Patient Instructions on file for this visit.   Signed, Fransico Him, MD  04/24/2021 8:52 AM    [facility] [place], [place], [place]  [address] Phone: [phone]; Fax: [phone]

## 2021-05-01 NOTE — Progress Notes (Signed)
Carelink Summary Report / Loop Recorder 

## 2021-05-07 ENCOUNTER — Other Ambulatory Visit (HOSPITAL_COMMUNITY): Payer: Self-pay | Admitting: Nephrology

## 2021-05-07 DIAGNOSIS — R195 Other fecal abnormalities: Secondary | ICD-10-CM | POA: Diagnosis not present

## 2021-05-07 DIAGNOSIS — N1832 Chronic kidney disease, stage 3b: Secondary | ICD-10-CM | POA: Diagnosis not present

## 2021-05-07 DIAGNOSIS — D509 Iron deficiency anemia, unspecified: Secondary | ICD-10-CM | POA: Diagnosis not present

## 2021-05-07 DIAGNOSIS — I10 Essential (primary) hypertension: Secondary | ICD-10-CM

## 2021-05-07 DIAGNOSIS — K5900 Constipation, unspecified: Secondary | ICD-10-CM | POA: Diagnosis not present

## 2021-05-07 DIAGNOSIS — Z8673 Personal history of transient ischemic attack (TIA), and cerebral infarction without residual deficits: Secondary | ICD-10-CM | POA: Diagnosis not present

## 2021-05-09 DIAGNOSIS — Z79899 Other long term (current) drug therapy: Secondary | ICD-10-CM | POA: Diagnosis not present

## 2021-05-09 DIAGNOSIS — R413 Other amnesia: Secondary | ICD-10-CM | POA: Diagnosis not present

## 2021-05-09 DIAGNOSIS — G2581 Restless legs syndrome: Secondary | ICD-10-CM | POA: Diagnosis not present

## 2021-05-11 ENCOUNTER — Telehealth (HOSPITAL_COMMUNITY): Payer: Self-pay | Admitting: *Deleted

## 2021-05-11 ENCOUNTER — Encounter (HOSPITAL_COMMUNITY): Payer: Self-pay | Admitting: *Deleted

## 2021-05-11 NOTE — Telephone Encounter (Signed)
Patient given detailed instructions per Myocardial Perfusion Study Information Sheet for the test on 05/18/21 at 10:15. Patient notified to arrive 15 minutes early and that it is imperative to arrive on time for appointment to keep from having the test rescheduled.  If you need to cancel or reschedule your appointment, please call the office within 24 hours of your appointment. . Patient verbalized understanding.[name] [name]

## 2021-05-18 ENCOUNTER — Other Ambulatory Visit: Payer: Self-pay

## 2021-05-18 ENCOUNTER — Ambulatory Visit (HOSPITAL_COMMUNITY): Payer: PPO | Attending: Internal Medicine

## 2021-05-18 ENCOUNTER — Ambulatory Visit (HOSPITAL_BASED_OUTPATIENT_CLINIC_OR_DEPARTMENT_OTHER): Payer: PPO

## 2021-05-18 DIAGNOSIS — I4510 Unspecified right bundle-branch block: Secondary | ICD-10-CM

## 2021-05-18 LAB — ECHOCARDIOGRAM COMPLETE
Area-P 1/2: 2.91 cm2
Height: 63 in
P 1/2 time: 634 msec
S' Lateral: 2.8 cm
Weight: 2016 oz

## 2021-05-18 LAB — MYOCARDIAL PERFUSION IMAGING
LV dias vol: 62 mL (ref 62–150)
LV sys vol: 21 mL
Nuc Stress EF: 67 %
Peak HR: 89 {beats}/min
Rest HR: 57 {beats}/min
Rest Nuclear Isotope Dose: 7.1 mCi
SDS: 0
SRS: 2
SSS: 3
ST Depression (mm): 0 mm
Stress Nuclear Isotope Dose: 25.8 mCi
TID: 1.06

## 2021-05-18 MED ORDER — TECHNETIUM TC 99M TETROFOSMIN IV KIT
7.1000 | PACK | Freq: Once | INTRAVENOUS | Status: AC | PRN
  Administered 2021-05-18: 7.1 via INTRAVENOUS
  Filled 2021-05-18: qty 8

## 2021-05-18 MED ORDER — TECHNETIUM TC 99M TETROFOSMIN IV KIT
25.8000 | PACK | Freq: Once | INTRAVENOUS | Status: AC | PRN
  Administered 2021-05-18: 25.8 via INTRAVENOUS
  Filled 2021-05-18: qty 26

## 2021-05-18 MED ORDER — REGADENOSON 0.4 MG/5ML IV SOLN
0.4000 mg | Freq: Once | INTRAVENOUS | Status: AC
  Administered 2021-05-18: 0.4 mg via INTRAVENOUS

## 2021-05-21 ENCOUNTER — Encounter: Payer: Self-pay | Admitting: Cardiology

## 2021-05-21 DIAGNOSIS — I351 Nonrheumatic aortic (valve) insufficiency: Secondary | ICD-10-CM | POA: Insufficient documentation

## 2021-05-23 ENCOUNTER — Ambulatory Visit (HOSPITAL_COMMUNITY)
Admission: RE | Admit: 2021-05-23 | Discharge: 2021-05-23 | Disposition: A | Discharge status: Home / Self Care | Payer: PPO | Source: Ambulatory Visit | Attending: Nephrology | Admitting: Nephrology

## 2021-05-23 ENCOUNTER — Ambulatory Visit (HOSPITAL_COMMUNITY): Admission: RE | Admit: 2021-05-23 | Payer: PPO | Source: Ambulatory Visit

## 2021-05-23 DIAGNOSIS — I10 Essential (primary) hypertension: Secondary | ICD-10-CM | POA: Diagnosis not present

## 2021-05-28 ENCOUNTER — Ambulatory Visit (INDEPENDENT_AMBULATORY_CARE_PROVIDER_SITE_OTHER): Payer: PPO

## 2021-05-28 DIAGNOSIS — I63412 Cerebral infarction due to embolism of left middle cerebral artery: Secondary | ICD-10-CM

## 2021-05-29 DIAGNOSIS — E039 Hypothyroidism, unspecified: Secondary | ICD-10-CM | POA: Diagnosis not present

## 2021-05-29 DIAGNOSIS — N1832 Chronic kidney disease, stage 3b: Secondary | ICD-10-CM | POA: Diagnosis not present

## 2021-05-29 DIAGNOSIS — D509 Iron deficiency anemia, unspecified: Secondary | ICD-10-CM | POA: Diagnosis not present

## 2021-05-29 DIAGNOSIS — E1122 Type 2 diabetes mellitus with diabetic chronic kidney disease: Secondary | ICD-10-CM | POA: Diagnosis not present

## 2021-05-29 LAB — CUP PACEART REMOTE DEVICE CHECK
Date Time Interrogation Session: 20221117002416
Implantable Pulse Generator Implant Date: 20190313

## 2021-06-06 NOTE — Progress Notes (Signed)
Carelink Summary Report / Loop Recorder 

## 2021-06-08 ENCOUNTER — Telehealth: Payer: Self-pay

## 2021-06-08 DIAGNOSIS — N1832 Chronic kidney disease, stage 3b: Secondary | ICD-10-CM | POA: Diagnosis not present

## 2021-06-08 NOTE — Telephone Encounter (Signed)
LINQ alert received. RRT reached 06/07/21. Routing to triage to notify. LH  Successful encounter to patient/wife (on DPR) to discuss RRT status. Options for removal discussed. Per wife patient will leave implanted at this time. Wife is instructed to unplug remote monitor. Address confirmed and return kit sent. Patient marked inactive in Pace[facility] and discontinued in carelink. All questions answered. Future remote monitoring appointments cancelled. Wife/patient appreciative of call.

## 2021-06-14 DIAGNOSIS — R195 Other fecal abnormalities: Secondary | ICD-10-CM | POA: Diagnosis not present

## 2021-07-06 DIAGNOSIS — N2581 Secondary hyperparathyroidism of renal origin: Secondary | ICD-10-CM | POA: Diagnosis not present

## 2021-07-06 DIAGNOSIS — D631 Anemia in chronic kidney disease: Secondary | ICD-10-CM | POA: Diagnosis not present

## 2021-07-06 DIAGNOSIS — N1832 Chronic kidney disease, stage 3b: Secondary | ICD-10-CM | POA: Diagnosis not present

## 2021-07-06 DIAGNOSIS — E1122 Type 2 diabetes mellitus with diabetic chronic kidney disease: Secondary | ICD-10-CM | POA: Diagnosis not present

## 2021-07-06 DIAGNOSIS — I129 Hypertensive chronic kidney disease with stage 1 through stage 4 chronic kidney disease, or unspecified chronic kidney disease: Secondary | ICD-10-CM | POA: Diagnosis not present

## 2021-07-06 DIAGNOSIS — N281 Cyst of kidney, acquired: Secondary | ICD-10-CM | POA: Diagnosis not present

## 2021-07-19 DIAGNOSIS — L821 Other seborrheic keratosis: Secondary | ICD-10-CM | POA: Diagnosis not present

## 2021-07-19 DIAGNOSIS — L578 Other skin changes due to chronic exposure to nonionizing radiation: Secondary | ICD-10-CM | POA: Diagnosis not present

## 2021-07-19 DIAGNOSIS — Z23 Encounter for immunization: Secondary | ICD-10-CM | POA: Diagnosis not present

## 2021-07-19 DIAGNOSIS — D229 Melanocytic nevi, unspecified: Secondary | ICD-10-CM | POA: Diagnosis not present

## 2021-07-19 DIAGNOSIS — L814 Other melanin hyperpigmentation: Secondary | ICD-10-CM | POA: Diagnosis not present

## 2021-07-19 DIAGNOSIS — D1801 Hemangioma of skin and subcutaneous tissue: Secondary | ICD-10-CM | POA: Diagnosis not present

## 2021-08-16 DIAGNOSIS — E1169 Type 2 diabetes mellitus with other specified complication: Secondary | ICD-10-CM | POA: Diagnosis not present

## 2021-08-16 DIAGNOSIS — F330 Major depressive disorder, recurrent, mild: Secondary | ICD-10-CM | POA: Diagnosis not present

## 2021-08-16 DIAGNOSIS — D509 Iron deficiency anemia, unspecified: Secondary | ICD-10-CM | POA: Diagnosis not present

## 2021-08-16 DIAGNOSIS — N184 Chronic kidney disease, stage 4 (severe): Secondary | ICD-10-CM | POA: Diagnosis not present

## 2021-08-16 DIAGNOSIS — E1122 Type 2 diabetes mellitus with diabetic chronic kidney disease: Secondary | ICD-10-CM | POA: Diagnosis not present

## 2021-08-16 DIAGNOSIS — N529 Male erectile dysfunction, unspecified: Secondary | ICD-10-CM | POA: Diagnosis not present

## 2021-08-16 DIAGNOSIS — Z794 Long term (current) use of insulin: Secondary | ICD-10-CM | POA: Diagnosis not present

## 2021-08-16 DIAGNOSIS — E039 Hypothyroidism, unspecified: Secondary | ICD-10-CM | POA: Diagnosis not present

## 2021-10-11 DIAGNOSIS — H5203 Hypermetropia, bilateral: Secondary | ICD-10-CM | POA: Diagnosis not present

## 2021-10-11 DIAGNOSIS — E119 Type 2 diabetes mellitus without complications: Secondary | ICD-10-CM | POA: Diagnosis not present

## 2021-10-11 DIAGNOSIS — Z961 Presence of intraocular lens: Secondary | ICD-10-CM | POA: Diagnosis not present

## 2021-10-12 DIAGNOSIS — E1122 Type 2 diabetes mellitus with diabetic chronic kidney disease: Secondary | ICD-10-CM | POA: Diagnosis not present

## 2021-10-12 DIAGNOSIS — D631 Anemia in chronic kidney disease: Secondary | ICD-10-CM | POA: Diagnosis not present

## 2021-10-12 DIAGNOSIS — N281 Cyst of kidney, acquired: Secondary | ICD-10-CM | POA: Diagnosis not present

## 2021-10-12 DIAGNOSIS — I129 Hypertensive chronic kidney disease with stage 1 through stage 4 chronic kidney disease, or unspecified chronic kidney disease: Secondary | ICD-10-CM | POA: Diagnosis not present

## 2021-10-12 DIAGNOSIS — N2581 Secondary hyperparathyroidism of renal origin: Secondary | ICD-10-CM | POA: Diagnosis not present

## 2021-10-12 DIAGNOSIS — N1832 Chronic kidney disease, stage 3b: Secondary | ICD-10-CM | POA: Diagnosis not present

## 2021-10-22 DIAGNOSIS — H6123 Impacted cerumen, bilateral: Secondary | ICD-10-CM | POA: Diagnosis not present

## 2021-10-26 DIAGNOSIS — N1832 Chronic kidney disease, stage 3b: Secondary | ICD-10-CM | POA: Diagnosis not present

## 2021-11-16 DIAGNOSIS — D485 Neoplasm of uncertain behavior of skin: Secondary | ICD-10-CM | POA: Diagnosis not present

## 2021-11-16 DIAGNOSIS — L821 Other seborrheic keratosis: Secondary | ICD-10-CM | POA: Diagnosis not present

## 2021-11-16 DIAGNOSIS — D04112 Carcinoma in situ of skin of right lower eyelid, including canthus: Secondary | ICD-10-CM | POA: Diagnosis not present

## 2021-11-16 DIAGNOSIS — L570 Actinic keratosis: Secondary | ICD-10-CM | POA: Diagnosis not present

## 2021-11-20 DIAGNOSIS — L84 Corns and callosities: Secondary | ICD-10-CM | POA: Diagnosis not present

## 2021-11-20 DIAGNOSIS — E1142 Type 2 diabetes mellitus with diabetic polyneuropathy: Secondary | ICD-10-CM | POA: Diagnosis not present

## 2021-11-20 DIAGNOSIS — B351 Tinea unguium: Secondary | ICD-10-CM | POA: Diagnosis not present

## 2021-11-20 DIAGNOSIS — M79676 Pain in unspecified toe(s): Secondary | ICD-10-CM | POA: Diagnosis not present

## 2021-11-23 DIAGNOSIS — E039 Hypothyroidism, unspecified: Secondary | ICD-10-CM | POA: Diagnosis not present

## 2021-11-23 DIAGNOSIS — N1832 Chronic kidney disease, stage 3b: Secondary | ICD-10-CM | POA: Diagnosis not present

## 2021-11-23 DIAGNOSIS — E1122 Type 2 diabetes mellitus with diabetic chronic kidney disease: Secondary | ICD-10-CM | POA: Diagnosis not present

## 2021-11-30 DIAGNOSIS — H903 Sensorineural hearing loss, bilateral: Secondary | ICD-10-CM | POA: Diagnosis not present

## 2021-12-04 DIAGNOSIS — H903 Sensorineural hearing loss, bilateral: Secondary | ICD-10-CM | POA: Diagnosis not present

## 2022-01-29 DIAGNOSIS — L84 Corns and callosities: Secondary | ICD-10-CM | POA: Diagnosis not present

## 2022-01-29 DIAGNOSIS — B351 Tinea unguium: Secondary | ICD-10-CM | POA: Diagnosis not present

## 2022-01-29 DIAGNOSIS — E1142 Type 2 diabetes mellitus with diabetic polyneuropathy: Secondary | ICD-10-CM | POA: Diagnosis not present

## 2022-01-29 DIAGNOSIS — M79676 Pain in unspecified toe(s): Secondary | ICD-10-CM | POA: Diagnosis not present

## 2022-02-21 DIAGNOSIS — L570 Actinic keratosis: Secondary | ICD-10-CM | POA: Diagnosis not present

## 2022-02-21 DIAGNOSIS — L820 Inflamed seborrheic keratosis: Secondary | ICD-10-CM | POA: Diagnosis not present

## 2022-02-22 DIAGNOSIS — N2581 Secondary hyperparathyroidism of renal origin: Secondary | ICD-10-CM | POA: Diagnosis not present

## 2022-02-22 DIAGNOSIS — N281 Cyst of kidney, acquired: Secondary | ICD-10-CM | POA: Diagnosis not present

## 2022-02-22 DIAGNOSIS — N1832 Chronic kidney disease, stage 3b: Secondary | ICD-10-CM | POA: Diagnosis not present

## 2022-02-22 DIAGNOSIS — I129 Hypertensive chronic kidney disease with stage 1 through stage 4 chronic kidney disease, or unspecified chronic kidney disease: Secondary | ICD-10-CM | POA: Diagnosis not present

## 2022-02-22 DIAGNOSIS — D631 Anemia in chronic kidney disease: Secondary | ICD-10-CM | POA: Diagnosis not present

## 2022-02-22 DIAGNOSIS — E1122 Type 2 diabetes mellitus with diabetic chronic kidney disease: Secondary | ICD-10-CM | POA: Diagnosis not present

## 2022-03-01 DIAGNOSIS — E039 Hypothyroidism, unspecified: Secondary | ICD-10-CM | POA: Diagnosis not present

## 2022-03-01 DIAGNOSIS — E1122 Type 2 diabetes mellitus with diabetic chronic kidney disease: Secondary | ICD-10-CM | POA: Diagnosis not present

## 2022-03-01 DIAGNOSIS — N1832 Chronic kidney disease, stage 3b: Secondary | ICD-10-CM | POA: Diagnosis not present

## 2022-03-14 DIAGNOSIS — N1832 Chronic kidney disease, stage 3b: Secondary | ICD-10-CM | POA: Diagnosis not present

## 2022-03-14 DIAGNOSIS — Z79899 Other long term (current) drug therapy: Secondary | ICD-10-CM | POA: Diagnosis not present

## 2022-03-14 DIAGNOSIS — E1122 Type 2 diabetes mellitus with diabetic chronic kidney disease: Secondary | ICD-10-CM | POA: Diagnosis not present

## 2022-03-14 DIAGNOSIS — Z8673 Personal history of transient ischemic attack (TIA), and cerebral infarction without residual deficits: Secondary | ICD-10-CM | POA: Diagnosis not present

## 2022-03-19 DIAGNOSIS — E1122 Type 2 diabetes mellitus with diabetic chronic kidney disease: Secondary | ICD-10-CM | POA: Diagnosis not present

## 2022-03-19 DIAGNOSIS — D692 Other nonthrombocytopenic purpura: Secondary | ICD-10-CM | POA: Diagnosis not present

## 2022-03-19 DIAGNOSIS — N2581 Secondary hyperparathyroidism of renal origin: Secondary | ICD-10-CM | POA: Diagnosis not present

## 2022-03-19 DIAGNOSIS — Z8673 Personal history of transient ischemic attack (TIA), and cerebral infarction without residual deficits: Secondary | ICD-10-CM | POA: Diagnosis not present

## 2022-03-19 DIAGNOSIS — G20 Parkinson's disease: Secondary | ICD-10-CM | POA: Diagnosis not present

## 2022-03-19 DIAGNOSIS — I700 Atherosclerosis of aorta: Secondary | ICD-10-CM | POA: Diagnosis not present

## 2022-03-19 DIAGNOSIS — I714 Abdominal aortic aneurysm, without rupture, unspecified: Secondary | ICD-10-CM | POA: Diagnosis not present

## 2022-03-19 DIAGNOSIS — I728 Aneurysm of other specified arteries: Secondary | ICD-10-CM | POA: Diagnosis not present

## 2022-03-19 DIAGNOSIS — Z0000 Encounter for general adult medical examination without abnormal findings: Secondary | ICD-10-CM | POA: Diagnosis not present

## 2022-03-19 DIAGNOSIS — Z9889 Other specified postprocedural states: Secondary | ICD-10-CM | POA: Diagnosis not present

## 2022-03-19 DIAGNOSIS — E039 Hypothyroidism, unspecified: Secondary | ICD-10-CM | POA: Diagnosis not present

## 2022-03-19 DIAGNOSIS — I739 Peripheral vascular disease, unspecified: Secondary | ICD-10-CM | POA: Diagnosis not present

## 2022-03-20 ENCOUNTER — Other Ambulatory Visit: Payer: Self-pay

## 2022-03-20 ENCOUNTER — Emergency Department (HOSPITAL_BASED_OUTPATIENT_CLINIC_OR_DEPARTMENT_OTHER): Payer: PPO

## 2022-03-20 ENCOUNTER — Emergency Department (HOSPITAL_BASED_OUTPATIENT_CLINIC_OR_DEPARTMENT_OTHER): Payer: PPO | Admitting: Radiology

## 2022-03-20 ENCOUNTER — Emergency Department (HOSPITAL_BASED_OUTPATIENT_CLINIC_OR_DEPARTMENT_OTHER)
Admission: EM | Admit: 2022-03-20 | Discharge: 2022-03-20 | Disposition: A | Discharge status: Home / Self Care | Payer: PPO | Source: Home / Self Care | Attending: Emergency Medicine | Admitting: Emergency Medicine

## 2022-03-20 ENCOUNTER — Encounter (HOSPITAL_BASED_OUTPATIENT_CLINIC_OR_DEPARTMENT_OTHER): Payer: Self-pay

## 2022-03-20 DIAGNOSIS — R81 Glycosuria: Secondary | ICD-10-CM | POA: Diagnosis present

## 2022-03-20 DIAGNOSIS — Z888 Allergy status to other drugs, medicaments and biological substances status: Secondary | ICD-10-CM | POA: Diagnosis not present

## 2022-03-20 DIAGNOSIS — E1122 Type 2 diabetes mellitus with diabetic chronic kidney disease: Secondary | ICD-10-CM | POA: Diagnosis present

## 2022-03-20 DIAGNOSIS — M6281 Muscle weakness (generalized): Secondary | ICD-10-CM | POA: Insufficient documentation

## 2022-03-20 DIAGNOSIS — E119 Type 2 diabetes mellitus without complications: Secondary | ICD-10-CM | POA: Insufficient documentation

## 2022-03-20 DIAGNOSIS — Z8261 Family history of arthritis: Secondary | ICD-10-CM | POA: Diagnosis not present

## 2022-03-20 DIAGNOSIS — Z7984 Long term (current) use of oral hypoglycemic drugs: Secondary | ICD-10-CM | POA: Diagnosis not present

## 2022-03-20 DIAGNOSIS — R7989 Other specified abnormal findings of blood chemistry: Secondary | ICD-10-CM | POA: Diagnosis present

## 2022-03-20 DIAGNOSIS — R531 Weakness: Secondary | ICD-10-CM | POA: Diagnosis present

## 2022-03-20 DIAGNOSIS — Z6823 Body mass index (BMI) 23.0-23.9, adult: Secondary | ICD-10-CM | POA: Diagnosis not present

## 2022-03-20 DIAGNOSIS — Z9049 Acquired absence of other specified parts of digestive tract: Secondary | ICD-10-CM | POA: Diagnosis not present

## 2022-03-20 DIAGNOSIS — Z7902 Long term (current) use of antithrombotics/antiplatelets: Secondary | ICD-10-CM | POA: Diagnosis not present

## 2022-03-20 DIAGNOSIS — N1832 Chronic kidney disease, stage 3b: Secondary | ICD-10-CM | POA: Diagnosis present

## 2022-03-20 DIAGNOSIS — I714 Abdominal aortic aneurysm, without rupture, unspecified: Secondary | ICD-10-CM | POA: Diagnosis present

## 2022-03-20 DIAGNOSIS — E785 Hyperlipidemia, unspecified: Secondary | ICD-10-CM | POA: Diagnosis present

## 2022-03-20 DIAGNOSIS — Z79899 Other long term (current) drug therapy: Secondary | ICD-10-CM | POA: Diagnosis not present

## 2022-03-20 DIAGNOSIS — I129 Hypertensive chronic kidney disease with stage 1 through stage 4 chronic kidney disease, or unspecified chronic kidney disease: Secondary | ICD-10-CM | POA: Diagnosis present

## 2022-03-20 DIAGNOSIS — R0602 Shortness of breath: Secondary | ICD-10-CM | POA: Diagnosis not present

## 2022-03-20 DIAGNOSIS — R627 Adult failure to thrive: Secondary | ICD-10-CM | POA: Diagnosis present

## 2022-03-20 DIAGNOSIS — Z20822 Contact with and (suspected) exposure to covid-19: Secondary | ICD-10-CM | POA: Insufficient documentation

## 2022-03-20 DIAGNOSIS — R110 Nausea: Secondary | ICD-10-CM | POA: Diagnosis not present

## 2022-03-20 DIAGNOSIS — N400 Enlarged prostate without lower urinary tract symptoms: Secondary | ICD-10-CM | POA: Diagnosis present

## 2022-03-20 DIAGNOSIS — E1151 Type 2 diabetes mellitus with diabetic peripheral angiopathy without gangrene: Secondary | ICD-10-CM | POA: Diagnosis present

## 2022-03-20 DIAGNOSIS — Z8673 Personal history of transient ischemic attack (TIA), and cerebral infarction without residual deficits: Secondary | ICD-10-CM | POA: Diagnosis not present

## 2022-03-20 DIAGNOSIS — D631 Anemia in chronic kidney disease: Secondary | ICD-10-CM | POA: Diagnosis present

## 2022-03-20 DIAGNOSIS — F03A0 Unspecified dementia, mild, without behavioral disturbance, psychotic disturbance, mood disturbance, and anxiety: Secondary | ICD-10-CM | POA: Diagnosis present

## 2022-03-20 DIAGNOSIS — H9190 Unspecified hearing loss, unspecified ear: Secondary | ICD-10-CM | POA: Diagnosis present

## 2022-03-20 DIAGNOSIS — R739 Hyperglycemia, unspecified: Secondary | ICD-10-CM | POA: Diagnosis not present

## 2022-03-20 DIAGNOSIS — E039 Hypothyroidism, unspecified: Secondary | ICD-10-CM | POA: Diagnosis present

## 2022-03-20 DIAGNOSIS — R509 Fever, unspecified: Secondary | ICD-10-CM | POA: Insufficient documentation

## 2022-03-20 DIAGNOSIS — R0902 Hypoxemia: Secondary | ICD-10-CM | POA: Diagnosis not present

## 2022-03-20 DIAGNOSIS — Z79890 Hormone replacement therapy (postmenopausal): Secondary | ICD-10-CM | POA: Diagnosis not present

## 2022-03-20 DIAGNOSIS — U071 COVID-19: Secondary | ICD-10-CM | POA: Diagnosis present

## 2022-03-20 LAB — COMPREHENSIVE METABOLIC PANEL
ALT: 17 U/L (ref 0–44)
AST: 19 U/L (ref 15–41)
Albumin: 4.1 g/dL (ref 3.5–5.0)
Alkaline Phosphatase: 89 U/L (ref 38–126)
Anion gap: 10 (ref 5–15)
BUN: 36 mg/dL — ABNORMAL HIGH (ref 8–23)
CO2: 22 mmol/L (ref 22–32)
Calcium: 9.3 mg/dL (ref 8.9–10.3)
Chloride: 103 mmol/L (ref 98–111)
Creatinine, Ser: 2.01 mg/dL — ABNORMAL HIGH (ref 0.61–1.24)
GFR, Estimated: 32 mL/min — ABNORMAL LOW (ref >60)
Glucose, Bld: 203 mg/dL — ABNORMAL HIGH (ref 70–99)
Potassium: 4.5 mmol/L (ref 3.5–5.1)
Sodium: 135 mmol/L (ref 135–145)
Total Bilirubin: 0.5 mg/dL (ref 0.3–1.2)
Total Protein: 6.7 g/dL (ref 6.5–8.1)

## 2022-03-20 LAB — URINALYSIS, ROUTINE W REFLEX MICROSCOPIC
Bilirubin Urine: NEGATIVE
Glucose, UA: 100 mg/dL — AB
Hgb urine dipstick: NEGATIVE
Ketones, ur: NEGATIVE mg/dL
Leukocytes,Ua: NEGATIVE
Nitrite: NEGATIVE
Protein, ur: NEGATIVE mg/dL
Specific Gravity, Urine: 1.01 (ref 1.005–1.030)
pH: 6 (ref 5.0–8.0)

## 2022-03-20 LAB — CBC WITH DIFFERENTIAL/PLATELET
Abs Immature Granulocytes: 0.03 10*3/uL (ref 0.00–0.07)
Basophils Absolute: 0 10*3/uL (ref 0.0–0.1)
Basophils Relative: 0 %
Eosinophils Absolute: 0.1 10*3/uL (ref 0.0–0.5)
Eosinophils Relative: 1 %
HCT: 36.8 % — ABNORMAL LOW (ref 39.0–52.0)
Hemoglobin: 13.2 g/dL (ref 13.0–17.0)
Immature Granulocytes: 0 %
Lymphocytes Relative: 9 %
Lymphs Abs: 1 10*3/uL (ref 0.7–4.0)
MCH: 33.3 pg (ref 26.0–34.0)
MCHC: 35.9 g/dL (ref 30.0–36.0)
MCV: 92.9 fL (ref 80.0–100.0)
Monocytes Absolute: 1 10*3/uL (ref 0.1–1.0)
Monocytes Relative: 9 %
Neutro Abs: 8.9 10*3/uL — ABNORMAL HIGH (ref 1.7–7.7)
Neutrophils Relative %: 81 %
Platelets: 150 10*3/uL (ref 150–400)
RBC: 3.96 MIL/uL — ABNORMAL LOW (ref 4.22–5.81)
RDW: 12.6 % (ref 11.5–15.5)
WBC: 10.9 10*3/uL — ABNORMAL HIGH (ref 4.0–10.5)
nRBC: 0 % (ref 0.0–0.2)

## 2022-03-20 LAB — PROTIME-INR
INR: 1.1 (ref 0.8–1.2)
Prothrombin Time: 14.4 seconds (ref 11.4–15.2)

## 2022-03-20 LAB — SARS CORONAVIRUS 2 BY RT PCR: SARS Coronavirus 2 by RT PCR: NEGATIVE

## 2022-03-20 LAB — APTT: aPTT: 32 seconds (ref 24–36)

## 2022-03-20 LAB — LACTIC ACID, PLASMA: Lactic Acid, Venous: 1 mmol/L (ref 0.5–1.9)

## 2022-03-20 MED ORDER — SODIUM CHLORIDE 0.9 % IV BOLUS
500.0000 mL | Freq: Once | INTRAVENOUS | Status: AC
  Administered 2022-03-20: 500 mL via INTRAVENOUS

## 2022-03-20 NOTE — ED Provider Notes (Signed)
[facility] EMERGENC[facility] DEPT Provider Note   CSN: [id number] Arrival date & time: 03/20/22  0414     History  Chief Complaint  Patient presents with   Fever   Weakness    [name] [name] is a 88 y.o. male.  HPI    This is an 88 year old male who presents with chills.  Per the patient's daughter who provides most of the history he developed chills last night before going to bed.  He woke up this morning and felt very weak.  He had difficulty getting out of bed and when he did his "legs gave out."  He did not hit his head or lose consciousness.  Patient denies unilateral weakness, numbness, or strokelike symptoms.  Daughter states that when she got to the house he had a temperature of 101.  He was given Tylenol prior to arrival.  She reports COVID testing was negative.  He has not had any dysuria.  No chest pain, shortness of breath, cough, abdominal pain.   Home Medications Prior to Admission medications   Medication Sig Start Date End Date Taking? Authorizing Provider  ALPRAZolam (XANAX) 0.25 MG tablet Take 0.25 mg by mouth at bedtime as needed for anxiety (1/2 to 1 tablet once daily as needed).    [provider]  cholecalciferol (VITAMIN D) 1000 units tablet Take 1,000 Units by mouth daily with supper.    [provider]  clopidogrel (PLAVIX) 75 MG tablet Take 1 tablet (75 mg total) by mouth daily. 09/11/17   Rama, Venetia Maxon, MD  Coenzyme Q10 (COQ10) 100 MG CAPS Take 100 mg by mouth daily.    [provider]  diazepam (VALIUM) 5 MG tablet Take 2.5-5 mg by mouth at bedtime as needed for anxiety (restless legs).    [provider]  doxazosin (CARDURA) 1 MG tablet Take 1 tablet (1 mg total) by mouth daily. Patient not taking: Reported on 04/24/2021 12/01/17   Debbe Odea, MD  levothyroxine (SYNTHROID, LEVOTHROID) 75 MCG tablet Take 75 mcg by mouth daily before breakfast.  09/15/13   [provider]  loratadine (CLARITIN)  10 MG tablet Take 10 mg by mouth daily.    [provider]  losartan (COZAAR) 25 MG tablet Take 25 mg by mouth daily. Patient not taking: Reported on 04/24/2021    [provider]  metFORMIN (GLUCOPHAGE) 500 MG tablet Take 1,000 mg by mouth 2 (two) times daily. Patient not taking: Reported on 04/24/2021 11/09/17   [provider]  Multiple Vitamin (MULTIVITAMIN WITH MINERALS) TABS tablet Take 1 tablet by mouth daily.    [provider]  Omega-3 1000 MG CAPS Take 1,000 mg by mouth daily.    [provider]  pantoprazole (PROTONIX) 40 MG tablet Take 1 tablet (40 mg total) by mouth 2 (two) times daily before a meal. 11/30/17   Debbe Odea, MD  rosuvastatin (CRESTOR) 10 MG tablet Take 10 mg by mouth at bedtime.     [provider]  saccharomyces boulardii (FLORASTOR) 250 MG capsule Take 1 capsule (250 mg total) by mouth 2 (two) times daily. Patient not taking: Reported on 04/24/2021 11/30/17   Debbe Odea, MD  sildenafil (REVATIO) 20 MG tablet Take 40-100 mg by mouth as needed (erectile dysfunction). For sexual activity Patient not taking: Reported on 04/24/2021 10/31/16   [provider]  vitamin B-12 (CYANOCOBALAMIN) 1000 MCG tablet Take 1,000 mcg by mouth daily.    [provider]      Allergies  Actos [pioglitazone], Allegra [fexofenadine], Iodine, Metformin, Metformin and related, Onglyza [saxagliptin], and Shellfish allergy    Review of Systems   Review of Systems  Constitutional:  Positive for chills, fatigue and fever.  Neurological:  Positive for weakness.  All other systems reviewed and are negative.   Physical Exam Updated Vital Signs BP (!) 158/68   Pulse 73   Temp 98.3 F (36.8 C) (Oral)   Resp 13   Ht 1.6 m ('5\' 3"'$ )   Wt 60.8 kg   SpO2 95%   BMI 23.74 kg/m  Physical Exam Vitals and nursing note reviewed.  Constitutional:      Appearance: He is well-developed.     Comments: Elderly,  nontoxic-appearing  HENT:     Head: Normocephalic and atraumatic.     Nose: No congestion.  Eyes:     Pupils: Pupils are equal, round, and reactive to light.  Cardiovascular:     Rate and Rhythm: Normal rate and regular rhythm.     Heart sounds: Normal heart sounds. No murmur heard. Pulmonary:     Effort: Pulmonary effort is normal. No respiratory distress.     Breath sounds: Normal breath sounds. No wheezing.  Abdominal:     General: Bowel sounds are normal.     Palpations: Abdomen is soft.     Tenderness: There is no abdominal tenderness. There is no rebound.  Musculoskeletal:     Cervical back: Neck supple.     Right lower leg: No edema.     Left lower leg: No edema.  Lymphadenopathy:     Cervical: No cervical adenopathy.  Skin:    General: Skin is warm and dry.  Neurological:     Mental Status: He is alert and oriented to person, place, and time.     Comments: Cranial nerves II through XII intact, 5 out of 5 strength in all 4 extremities  Psychiatric:        Mood and Affect: Mood normal.     ED Results / Procedures / Treatments   Labs (all labs ordered are listed, but only abnormal results are displayed) Labs Reviewed  COMPREHENSIVE METABOLIC PANEL - Abnormal; Notable for the following components:      Result Value   Glucose, Bld 203 (*)    BUN 36 (*)    Creatinine, Ser 2.01 (*)    GFR, Estimated 32 (*)    All other components within normal limits  CBC WITH DIFFERENTIAL/PLATELET - Abnormal; Notable for the following components:   WBC 10.9 (*)    RBC 3.96 (*)    HCT 36.8 (*)    Neutro Abs 8.9 (*)    All other components within normal limits  URINALYSIS, ROUTINE W REFLEX MICROSCOPIC - Abnormal; Notable for the following components:   Color, Urine COLORLESS (*)    Glucose, UA 100 (*)    All other components within normal limits  SARS CORONAVIRUS 2 BY RT PCR  CULTURE, BLOOD (ROUTINE X 2)  CULTURE, BLOOD (ROUTINE X 2)  URINE CULTURE  LACTIC ACID, PLASMA   PROTIME-INR  APTT  LACTIC ACID, PLASMA    EKG EKG Interpretation  Date/Time:  Wednesday March 20 2022 04:26:37 EDT Ventricular Rate:  72 PR Interval:  165 QRS Duration: 158 QT Interval:  414 QTC Calculation: 454 R Axis:   203 Text Interpretation: Sinus rhythm Nonspecific intraventricular conduction delay Confirmed by Thayer Jew [phone]) on 03/20/2022 4:53:53 AM  Radiology DG Chest 2 View  Result Date: 03/20/2022 CLINICAL DATA:  Inconclusive  single view. Fever EXAM: CHEST - 2 VIEW COMPARISON:  Earlier today FINDINGS: Improved inflation. There is no edema, consolidation, effusion, or pneumothorax. Normal heart size and mediastinal contours. Implantable loop recorder. Cholecystectomy clips. Artifact from EKG leads. IMPRESSION: Clear lungs.  No evidence of active disease. Electronically Signed   By: Jorje Guild M.D.   On: 03/20/2022 05:40   DG Chest Port 1 View  Result Date: 03/20/2022 CLINICAL DATA:  Questionable sepsis. Fever and chills. General weakness. EXAM: PORTABLE CHEST 1 VIEW COMPARISON:  PA Lat 12/30/2017 FINDINGS: The lungs are expiratory. There is increased opacity in both infrahilar areas which could be atelectasis or small infiltrates. The remainder of the hypoinflated lungs are generally clear. No pleural effusion is seen. Heart size and vasculature are normal with normal mediastinal configuration. There is a left chest loop recorder device. Osteopenia and thoracic spondylosis. IMPRESSION: Expiratory study with bilateral infrahilar opacities which could be atelectasis or pneumonia. Follow-up study recommended with full inspiration. Electronically Signed   By: Telford Nab M.D.   On: 03/20/2022 04:55    Procedures Procedures    Medications Ordered in ED Medications  sodium chloride 0.9 % bolus 500 mL (0 mLs Intravenous Stopped 03/20/22 4098)    ED Course/ Medical Decision Making/ A&P                           Medical Decision Making Amount and/or  Complexity of Data Reviewed Labs: ordered. Radiology: ordered.   This patient presents to the ED for concern of fever, generalized weakness, this involves an extensive number of treatment options, and is a complaint that carries with it a high risk of complications and morbidity.  I considered the following differential and admission for this acute, potentially life threatening condition.  The differential diagnosis includes infection such as pneumonia or UTI, COVID-19, influenza, less likely stroke or encephalopathy  MDM:    This is an 88 year old male who presents with fever and generalized weakness.  Onset of symptoms last night.  Reports that he was unable to get out of bed and his legs gave out from under him this morning.  He is nonfocal on exam.  No strokelike symptoms.  Daughter reports fever at home.  He is afebrile here after getting Tylenol at home.  He does not have any other significant symptoms.  Sepsis work-up initiated.  Lactate normal.  Slight leukocytosis to 10.9.  Creatinine is 2.01.  Daughter reports most recent creatinine 1.9.  COVID-negative.  No evidence of UTI.  Initial x-ray inconclusive but may indicate pneumonia.  Repeat two-view x-ray is clear.  Patient does not have any upper respiratory symptoms.  I discussed the results with the patient and his daughter.  No overt signs of sepsis at this time.  Feel his weakness may be related to fever.  At this time there is no obvious source.  Patient did ambulate at his baseline.  Daughter is an NP.  Given lack of other symptoms, would hold empiric treatment.  She is agreeable to this plan.  We will follow his symptoms closely and follow-up with primary physician.  (Labs, imaging, consults)  Labs: I Ordered, and personally interpreted labs.  The pertinent results include: CBC, CMP, lactate, urinalysis  Imaging Studies ordered: I ordered imaging studies including chest x-ray I independently visualized and interpreted imaging. I  agree with the radiologist interpretation  Additional history obtained from daughter at bedside.  External records from outside source obtained and reviewed  including prior labs  Cardiac Monitoring: The patient was maintained on a cardiac monitor.  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[place])    Diabetes mellitus without complication ([place])    Hiatal hernia    HOH (hard of hearing)    Hyperlipidemia    Stroke ([place])      Medicines Meds ordered this encounter  Medications   sodium chloride 0.9 % bolus 500 mL    I have reviewed the patients home medicines and have made adjustments as needed  Problem List / ED Course: Problem List Items Addressed This Visit   None Visit Diagnoses     Fever, unspecified    -  Primary   Generalized weakness                       Final Clinical Impression(s) / ED Diagnoses Final diagnoses:  Fever, unspecified  Generalized weakness    Rx / DC Orders ED Discharge Orders     None         Merryl Hacker, MD 03/20/22 [phone]

## 2022-03-20 NOTE — Discharge Instructions (Signed)
You were seen today for fever and generalized weakness.  Make sure that you are staying hydrated.  Take Tylenol as needed for fevers.  Monitor for specific signs and symptoms of infection.  If worsening, you should be reevaluated.

## 2022-03-20 NOTE — ED Triage Notes (Signed)
1G of tylenol given approx 45 mins ago

## 2022-03-20 NOTE — ED Notes (Signed)
Pt on monitor 

## 2022-03-20 NOTE — ED Triage Notes (Signed)
POV from home.  Pt sts that he began having fever/chills since yesterday, pt began feeling weak and legs "gave out and would not work". Alert and oriented, GCS 15.   Pt warm to touch, needed assistance from car.

## 2022-03-21 LAB — URINE CULTURE: Culture: NO GROWTH

## 2022-03-22 ENCOUNTER — Encounter (HOSPITAL_COMMUNITY): Payer: Self-pay | Admitting: Emergency Medicine

## 2022-03-22 ENCOUNTER — Inpatient Hospital Stay (HOSPITAL_COMMUNITY)
Admission: EM | Admit: 2022-03-22 | Discharge: 2022-03-26 | DRG: 179 | Disposition: A | Discharge status: Home / Self Care | Payer: PPO | Attending: Internal Medicine | Admitting: Internal Medicine

## 2022-03-22 ENCOUNTER — Other Ambulatory Visit: Payer: Self-pay

## 2022-03-22 ENCOUNTER — Emergency Department (HOSPITAL_COMMUNITY): Payer: PPO

## 2022-03-22 DIAGNOSIS — E1159 Type 2 diabetes mellitus with other circulatory complications: Secondary | ICD-10-CM | POA: Diagnosis present

## 2022-03-22 DIAGNOSIS — Z8673 Personal history of transient ischemic attack (TIA), and cerebral infarction without residual deficits: Secondary | ICD-10-CM

## 2022-03-22 DIAGNOSIS — N1832 Chronic kidney disease, stage 3b: Secondary | ICD-10-CM | POA: Diagnosis not present

## 2022-03-22 DIAGNOSIS — D631 Anemia in chronic kidney disease: Secondary | ICD-10-CM | POA: Diagnosis present

## 2022-03-22 DIAGNOSIS — Z79899 Other long term (current) drug therapy: Secondary | ICD-10-CM

## 2022-03-22 DIAGNOSIS — F03A0 Unspecified dementia, mild, without behavioral disturbance, psychotic disturbance, mood disturbance, and anxiety: Secondary | ICD-10-CM | POA: Diagnosis present

## 2022-03-22 DIAGNOSIS — E1151 Type 2 diabetes mellitus with diabetic peripheral angiopathy without gangrene: Secondary | ICD-10-CM | POA: Diagnosis present

## 2022-03-22 DIAGNOSIS — Z8261 Family history of arthritis: Secondary | ICD-10-CM

## 2022-03-22 DIAGNOSIS — E785 Hyperlipidemia, unspecified: Secondary | ICD-10-CM | POA: Diagnosis present

## 2022-03-22 DIAGNOSIS — Z79890 Hormone replacement therapy (postmenopausal): Secondary | ICD-10-CM

## 2022-03-22 DIAGNOSIS — F0390 Unspecified dementia without behavioral disturbance: Secondary | ICD-10-CM | POA: Diagnosis present

## 2022-03-22 DIAGNOSIS — Z6823 Body mass index (BMI) 23.0-23.9, adult: Secondary | ICD-10-CM

## 2022-03-22 DIAGNOSIS — R7989 Other specified abnormal findings of blood chemistry: Secondary | ICD-10-CM | POA: Diagnosis present

## 2022-03-22 DIAGNOSIS — Z9049 Acquired absence of other specified parts of digestive tract: Secondary | ICD-10-CM

## 2022-03-22 DIAGNOSIS — N400 Enlarged prostate without lower urinary tract symptoms: Secondary | ICD-10-CM | POA: Diagnosis present

## 2022-03-22 DIAGNOSIS — H9190 Unspecified hearing loss, unspecified ear: Secondary | ICD-10-CM | POA: Diagnosis present

## 2022-03-22 DIAGNOSIS — I129 Hypertensive chronic kidney disease with stage 1 through stage 4 chronic kidney disease, or unspecified chronic kidney disease: Secondary | ICD-10-CM | POA: Diagnosis present

## 2022-03-22 DIAGNOSIS — I10 Essential (primary) hypertension: Secondary | ICD-10-CM | POA: Diagnosis present

## 2022-03-22 DIAGNOSIS — D649 Anemia, unspecified: Secondary | ICD-10-CM | POA: Diagnosis present

## 2022-03-22 DIAGNOSIS — R81 Glycosuria: Secondary | ICD-10-CM | POA: Diagnosis present

## 2022-03-22 DIAGNOSIS — Z7984 Long term (current) use of oral hypoglycemic drugs: Secondary | ICD-10-CM

## 2022-03-22 DIAGNOSIS — E1122 Type 2 diabetes mellitus with diabetic chronic kidney disease: Secondary | ICD-10-CM | POA: Diagnosis present

## 2022-03-22 DIAGNOSIS — Z888 Allergy status to other drugs, medicaments and biological substances status: Secondary | ICD-10-CM

## 2022-03-22 DIAGNOSIS — E039 Hypothyroidism, unspecified: Secondary | ICD-10-CM | POA: Diagnosis present

## 2022-03-22 DIAGNOSIS — U071 COVID-19: Principal | ICD-10-CM | POA: Diagnosis present

## 2022-03-22 DIAGNOSIS — I714 Abdominal aortic aneurysm, without rupture, unspecified: Secondary | ICD-10-CM | POA: Diagnosis present

## 2022-03-22 DIAGNOSIS — Z7902 Long term (current) use of antithrombotics/antiplatelets: Secondary | ICD-10-CM

## 2022-03-22 DIAGNOSIS — R531 Weakness: Secondary | ICD-10-CM

## 2022-03-22 DIAGNOSIS — R627 Adult failure to thrive: Secondary | ICD-10-CM | POA: Diagnosis present

## 2022-03-22 HISTORY — DX: Essential (primary) hypertension: I10

## 2022-03-22 LAB — URINALYSIS, ROUTINE W REFLEX MICROSCOPIC
Bacteria, UA: NONE SEEN
Bilirubin Urine: NEGATIVE
Glucose, UA: >=500 mg/dL — AB
Hgb urine dipstick: NEGATIVE
Ketones, ur: NEGATIVE mg/dL
Leukocytes,Ua: NEGATIVE
Nitrite: NEGATIVE
Protein, ur: NEGATIVE mg/dL
Specific Gravity, Urine: 1.01 (ref 1.005–1.030)
pH: 6 (ref 5.0–8.0)

## 2022-03-22 LAB — CBC WITH DIFFERENTIAL/PLATELET
Abs Immature Granulocytes: 0.03 10*3/uL (ref 0.00–0.07)
Basophils Absolute: 0 10*3/uL (ref 0.0–0.1)
Basophils Relative: 0 %
Eosinophils Absolute: 0 10*3/uL (ref 0.0–0.5)
Eosinophils Relative: 0 %
HCT: 38 % — ABNORMAL LOW (ref 39.0–52.0)
Hemoglobin: 13.3 g/dL (ref 13.0–17.0)
Immature Granulocytes: 0 %
Lymphocytes Relative: 6 %
Lymphs Abs: 0.4 10*3/uL — ABNORMAL LOW (ref 0.7–4.0)
MCH: 33.3 pg (ref 26.0–34.0)
MCHC: 35 g/dL (ref 30.0–36.0)
MCV: 95 fL (ref 80.0–100.0)
Monocytes Absolute: 0.8 10*3/uL (ref 0.1–1.0)
Monocytes Relative: 11 %
Neutro Abs: 5.6 10*3/uL (ref 1.7–7.7)
Neutrophils Relative %: 83 %
Platelets: 145 10*3/uL — ABNORMAL LOW (ref 150–400)
RBC: 4 MIL/uL — ABNORMAL LOW (ref 4.22–5.81)
RDW: 12.6 % (ref 11.5–15.5)
WBC: 6.8 10*3/uL (ref 4.0–10.5)
nRBC: 0 % (ref 0.0–0.2)

## 2022-03-22 LAB — I-STAT CHEM 8, ED
BUN: 24 mg/dL — ABNORMAL HIGH (ref 8–23)
Calcium, Ion: 1.25 mmol/L (ref 1.15–1.40)
Chloride: 100 mmol/L (ref 98–111)
Creatinine, Ser: 1.9 mg/dL — ABNORMAL HIGH (ref 0.61–1.24)
Glucose, Bld: 282 mg/dL — ABNORMAL HIGH (ref 70–99)
HCT: 40 % (ref 39.0–52.0)
Hemoglobin: 13.6 g/dL (ref 13.0–17.0)
Potassium: 4.3 mmol/L (ref 3.5–5.1)
Sodium: 133 mmol/L — ABNORMAL LOW (ref 135–145)
TCO2: 21 mmol/L — ABNORMAL LOW (ref 22–32)

## 2022-03-22 LAB — SARS CORONAVIRUS 2 BY RT PCR: SARS Coronavirus 2 by RT PCR: POSITIVE — AB

## 2022-03-22 MED ORDER — LACTATED RINGERS IV SOLN: INTRAVENOUS | Status: AC

## 2022-03-22 MED ORDER — SODIUM CHLORIDE 0.9 % IV BOLUS
1000.0000 mL | Freq: Once | INTRAVENOUS | Status: AC
  Administered 2022-03-22: 1000 mL via INTRAVENOUS

## 2022-03-22 NOTE — ED Triage Notes (Signed)
Patient presents from home due to progressive weakness, low O2 sats, fever of 102 and newly incontinent. He tested positive for Covid 03/20/2022.  EMS vitals: 130/86 BP 86 HR 20 RR 92-97% SPO2 om room ir 377 CBG (prior to insulin)

## 2022-03-22 NOTE — ED Provider Notes (Signed)
[facility] DEPT Provider Note   CSN: [id number] Arrival date & time: 03/22/22  2108     History  Chief Complaint  Patient presents with   Fever   Weakness   Covid Positive    AMIERE [name] is a 88 y.o. male.  88 yo M with a chief complaints of fatigue cough shortness of breath.  He tells me that this has been going on for couple days since he was diagnosed with COVID.  Feels like he has been coughing and really rundown.  Symptoms have persisted and so came here for evaluation.   Fever Weakness Associated symptoms: fever        Home Medications Prior to Admission medications   Medication Sig Start Date End Date Taking? Authorizing Provider  ALPRAZolam (XANAX) 0.25 MG tablet Take 0.25 mg by mouth at bedtime as needed for anxiety (1/2 to 1 tablet once daily as needed).    [provider]  cholecalciferol (VITAMIN D) 1000 units tablet Take 1,000 Units by mouth daily with supper.    [provider]  clopidogrel (PLAVIX) 75 MG tablet Take 1 tablet (75 mg total) by mouth daily. 09/11/17   Rama, Venetia Maxon, MD  Coenzyme Q10 (COQ10) 100 MG CAPS Take 100 mg by mouth daily.    [provider]  diazepam (VALIUM) 5 MG tablet Take 2.5-5 mg by mouth at bedtime as needed for anxiety (restless legs).    [provider]  doxazosin (CARDURA) 1 MG tablet Take 1 tablet (1 mg total) by mouth daily. Patient not taking: Reported on 04/24/2021 12/01/17   Debbe Odea, MD  levothyroxine (SYNTHROID, LEVOTHROID) 75 MCG tablet Take 75 mcg by mouth daily before breakfast.  09/15/13   [provider]  loratadine (CLARITIN) 10 MG tablet Take 10 mg by mouth daily.    [provider]  losartan (COZAAR) 25 MG tablet Take 25 mg by mouth daily. Patient not taking: Reported on 04/24/2021    [provider]  metFORMIN (GLUCOPHAGE) 500 MG tablet Take 1,000 mg by mouth 2 (two) times daily. Patient not taking:  Reported on 04/24/2021 11/09/17   [provider]  Multiple Vitamin (MULTIVITAMIN WITH MINERALS) TABS tablet Take 1 tablet by mouth daily.    [provider]  Omega-3 1000 MG CAPS Take 1,000 mg by mouth daily.    [provider]  pantoprazole (PROTONIX) 40 MG tablet Take 1 tablet (40 mg total) by mouth 2 (two) times daily before a meal. 11/30/17   Debbe Odea, MD  rosuvastatin (CRESTOR) 10 MG tablet Take 10 mg by mouth at bedtime.     [provider]  saccharomyces boulardii (FLORASTOR) 250 MG capsule Take 1 capsule (250 mg total) by mouth 2 (two) times daily. Patient not taking: Reported on 04/24/2021 11/30/17   Debbe Odea, MD  sildenafil (REVATIO) 20 MG tablet Take 40-100 mg by mouth as needed (erectile dysfunction). For sexual activity Patient not taking: Reported on 04/24/2021 10/31/16   [provider]  vitamin B-12 (CYANOCOBALAMIN) 1000 MCG tablet Take 1,000 mcg by mouth daily.    [provider]      Allergies    Actos [pioglitazone], Allegra [fexofenadine], Iodine, Metformin, Metformin and related, Onglyza [saxagliptin], and Shellfish allergy    Review of Systems   Review of Systems  Constitutional:  Positive for fever.  Neurological:  Positive for weakness.    Physical Exam Updated Vital Signs BP (!) 159/80   Pulse 97   Temp  99.7 F (37.6 C) (Oral)   Resp 17   SpO2 96%  Physical Exam Vitals and nursing note reviewed.  Constitutional:      Appearance: He is well-developed.  HENT:     Head: Normocephalic and atraumatic.  Eyes:     Pupils: Pupils are equal, round, and reactive to light.  Neck:     Vascular: No JVD.  Cardiovascular:     Rate and Rhythm: Normal rate and regular rhythm.     Heart sounds: No murmur heard.    No friction rub. No gallop.  Pulmonary:     Effort: No respiratory distress.     Breath sounds: No wheezing.  Abdominal:     General: There is no distension.     Tenderness: There is no  abdominal tenderness. There is no guarding or rebound.  Musculoskeletal:        General: Normal range of motion.     Cervical back: Normal range of motion and neck supple.  Skin:    Coloration: Skin is not pale.     Findings: No rash.  Neurological:     Mental Status: He is alert.     Comments: Confused to questioning  Psychiatric:        Behavior: Behavior normal.     ED Results / Procedures / Treatments   Labs (all labs ordered are listed, but only abnormal results are displayed) Labs Reviewed  SARS CORONAVIRUS 2 BY RT PCR - Abnormal; Notable for the following components:      Result Value   SARS Coronavirus 2 by RT PCR POSITIVE (*)    All other components within normal limits  CBC WITH DIFFERENTIAL/PLATELET - Abnormal; Notable for the following components:   RBC 4.00 (*)    HCT 38.0 (*)    Platelets 145 (*)    Lymphs Abs 0.4 (*)    All other components within normal limits  URINALYSIS, ROUTINE W REFLEX MICROSCOPIC - Abnormal; Notable for the following components:   Color, Urine STRAW (*)    Glucose, UA >=500 (*)    All other components within normal limits  I-STAT CHEM 8, ED - Abnormal; Notable for the following components:   Sodium 133 (*)    BUN 24 (*)    Creatinine, Ser 1.90 (*)    Glucose, Bld 282 (*)    TCO2 21 (*)    All other components within normal limits    EKG None  Radiology DG Chest Port 1 View  Result Date: 03/22/2022 CLINICAL DATA:  Shortness of breath EXAM: PORTABLE CHEST 1 VIEW COMPARISON:  Chest x-ray 12/30/2017 FINDINGS: Loop recorder device overlies the left chest, new from prior. The heart size and mediastinal contours are within normal limits. Both lungs are clear. The visualized skeletal structures are unremarkable. IMPRESSION: No active disease. Electronically Signed   By: Ronney Asters M.D.   On: 03/22/2022 22:37    Procedures Procedures    Medications Ordered in ED Medications  lactated ringers infusion (has no administration in  time range)  sodium chloride 0.9 % bolus 1,000 mL (1,000 mLs Intravenous New Bag/Given 03/22/22 2217)    ED Course/ Medical Decision Making/ A&P                           Medical Decision Making Amount and/or Complexity of Data Reviewed Labs: ordered. Radiology: ordered.   88 yo M with a chief complaints of difficulty breathing.  This been going on  for about 48 hours or so.  He tells me it started when he tested positive for COVID.  He has some trouble with the history.  On my record review the patient was actually seen 48 hours ago in the emergency department and tested negative for COVID.  That time he had a chest x-ray without obvious focal infiltrates and had a laboratory and urine testing that was also negative and was sent home with presumption of a viral syndrome.  Patient's COVID test today is positive.  He is too tired to even stand up at bedside.  Received further history from the family.  Had gone home after her most recent ED visit.  Had gotten progressively worse.  Repeat COVID test positive at home.  No appreciable hypoxia on home pulse oximeter.  Was found at 1 point down the ground and unable to get up on his own.  No reported injury.    Given IV fluids here.  No significant improvement.  Will discuss with hospitalist for admission.  The patients results and plan were reviewed and discussed.   Any x-rays performed were independently reviewed by myself.   Differential diagnosis were considered with the presenting HPI.  Medications  lactated ringers infusion (has no administration in time range)  sodium chloride 0.9 % bolus 1,000 mL (1,000 mLs Intravenous New Bag/Given 03/22/22 2217)    Vitals:   03/22/22 2205 03/22/22 2300 03/22/22 2310 03/22/22 2315  BP: (!) 161/71 (!) 159/80    Pulse: 84 99  97  Resp: 14 (!) 24  17  Temp:   99.7 F (37.6 C)   TempSrc:   Oral   SpO2: 97% 98%  96%    Final diagnoses:  COVID-19 virus infection    Admission/ observation were  discussed with the admitting physician, patient and/or family and they are comfortable with the plan.          Final Clinical Impression(s) / ED Diagnoses Final diagnoses:  COVID-19 virus infection    Rx / DC Orders ED Discharge Orders     None         Deno Etienne, DO 03/22/22 2341

## 2022-03-22 NOTE — ED Notes (Signed)
Patient unable to stand or walk for O2 with ambulation.

## 2022-03-23 ENCOUNTER — Encounter (HOSPITAL_COMMUNITY): Payer: Self-pay | Admitting: Emergency Medicine

## 2022-03-23 DIAGNOSIS — U071 COVID-19: Principal | ICD-10-CM | POA: Diagnosis present

## 2022-03-23 DIAGNOSIS — F03A0 Unspecified dementia, mild, without behavioral disturbance, psychotic disturbance, mood disturbance, and anxiety: Secondary | ICD-10-CM | POA: Diagnosis present

## 2022-03-23 DIAGNOSIS — Z7902 Long term (current) use of antithrombotics/antiplatelets: Secondary | ICD-10-CM | POA: Diagnosis not present

## 2022-03-23 DIAGNOSIS — Z8673 Personal history of transient ischemic attack (TIA), and cerebral infarction without residual deficits: Secondary | ICD-10-CM | POA: Diagnosis not present

## 2022-03-23 DIAGNOSIS — E1151 Type 2 diabetes mellitus with diabetic peripheral angiopathy without gangrene: Secondary | ICD-10-CM | POA: Diagnosis present

## 2022-03-23 DIAGNOSIS — I714 Abdominal aortic aneurysm, without rupture, unspecified: Secondary | ICD-10-CM | POA: Diagnosis present

## 2022-03-23 DIAGNOSIS — E785 Hyperlipidemia, unspecified: Secondary | ICD-10-CM | POA: Diagnosis present

## 2022-03-23 DIAGNOSIS — D649 Anemia, unspecified: Secondary | ICD-10-CM | POA: Diagnosis present

## 2022-03-23 DIAGNOSIS — Z7984 Long term (current) use of oral hypoglycemic drugs: Secondary | ICD-10-CM | POA: Diagnosis not present

## 2022-03-23 DIAGNOSIS — I129 Hypertensive chronic kidney disease with stage 1 through stage 4 chronic kidney disease, or unspecified chronic kidney disease: Secondary | ICD-10-CM | POA: Diagnosis present

## 2022-03-23 DIAGNOSIS — R627 Adult failure to thrive: Secondary | ICD-10-CM | POA: Diagnosis present

## 2022-03-23 DIAGNOSIS — E1122 Type 2 diabetes mellitus with diabetic chronic kidney disease: Secondary | ICD-10-CM | POA: Diagnosis present

## 2022-03-23 DIAGNOSIS — Z9049 Acquired absence of other specified parts of digestive tract: Secondary | ICD-10-CM | POA: Diagnosis not present

## 2022-03-23 DIAGNOSIS — H9190 Unspecified hearing loss, unspecified ear: Secondary | ICD-10-CM | POA: Diagnosis present

## 2022-03-23 DIAGNOSIS — E039 Hypothyroidism, unspecified: Secondary | ICD-10-CM | POA: Diagnosis present

## 2022-03-23 DIAGNOSIS — Z8261 Family history of arthritis: Secondary | ICD-10-CM | POA: Diagnosis not present

## 2022-03-23 DIAGNOSIS — R531 Weakness: Secondary | ICD-10-CM | POA: Diagnosis present

## 2022-03-23 DIAGNOSIS — Z79899 Other long term (current) drug therapy: Secondary | ICD-10-CM | POA: Diagnosis not present

## 2022-03-23 DIAGNOSIS — Z79890 Hormone replacement therapy (postmenopausal): Secondary | ICD-10-CM | POA: Diagnosis not present

## 2022-03-23 DIAGNOSIS — Z888 Allergy status to other drugs, medicaments and biological substances status: Secondary | ICD-10-CM | POA: Diagnosis not present

## 2022-03-23 DIAGNOSIS — R81 Glycosuria: Secondary | ICD-10-CM | POA: Diagnosis present

## 2022-03-23 DIAGNOSIS — R7989 Other specified abnormal findings of blood chemistry: Secondary | ICD-10-CM | POA: Diagnosis present

## 2022-03-23 DIAGNOSIS — D631 Anemia in chronic kidney disease: Secondary | ICD-10-CM | POA: Diagnosis present

## 2022-03-23 DIAGNOSIS — N400 Enlarged prostate without lower urinary tract symptoms: Secondary | ICD-10-CM | POA: Diagnosis present

## 2022-03-23 DIAGNOSIS — Z6823 Body mass index (BMI) 23.0-23.9, adult: Secondary | ICD-10-CM | POA: Diagnosis not present

## 2022-03-23 DIAGNOSIS — N1832 Chronic kidney disease, stage 3b: Secondary | ICD-10-CM | POA: Diagnosis present

## 2022-03-23 LAB — CBC WITH DIFFERENTIAL/PLATELET
Abs Immature Granulocytes: 0.02 10*3/uL (ref 0.00–0.07)
Basophils Absolute: 0 10*3/uL (ref 0.0–0.1)
Basophils Relative: 0 %
Eosinophils Absolute: 0 10*3/uL (ref 0.0–0.5)
Eosinophils Relative: 0 %
HCT: 34.5 % — ABNORMAL LOW (ref 39.0–52.0)
Hemoglobin: 12.1 g/dL — ABNORMAL LOW (ref 13.0–17.0)
Immature Granulocytes: 0 %
Lymphocytes Relative: 10 %
Lymphs Abs: 0.6 10*3/uL — ABNORMAL LOW (ref 0.7–4.0)
MCH: 33 pg (ref 26.0–34.0)
MCHC: 35.1 g/dL (ref 30.0–36.0)
MCV: 94 fL (ref 80.0–100.0)
Monocytes Absolute: 0.8 10*3/uL (ref 0.1–1.0)
Monocytes Relative: 15 %
Neutro Abs: 4.1 10*3/uL (ref 1.7–7.7)
Neutrophils Relative %: 75 %
Platelets: 152 10*3/uL (ref 150–400)
RBC: 3.67 MIL/uL — ABNORMAL LOW (ref 4.22–5.81)
RDW: 12.4 % (ref 11.5–15.5)
WBC: 5.5 10*3/uL (ref 4.0–10.5)
nRBC: 0 % (ref 0.0–0.2)

## 2022-03-23 LAB — GLUCOSE, CAPILLARY
Glucose-Capillary: 262 mg/dL — ABNORMAL HIGH (ref 70–99)
Glucose-Capillary: 115 mg/dL — ABNORMAL HIGH (ref 70–99)

## 2022-03-23 LAB — COMPREHENSIVE METABOLIC PANEL
ALT: 17 U/L (ref 0–44)
AST: 19 U/L (ref 15–41)
Albumin: 3.1 g/dL — ABNORMAL LOW (ref 3.5–5.0)
Alkaline Phosphatase: 60 U/L (ref 38–126)
Anion gap: 6 (ref 5–15)
BUN: 25 mg/dL — ABNORMAL HIGH (ref 8–23)
CO2: 22 mmol/L (ref 22–32)
Calcium: 8.6 mg/dL — ABNORMAL LOW (ref 8.9–10.3)
Chloride: 107 mmol/L (ref 98–111)
Creatinine, Ser: 1.85 mg/dL — ABNORMAL HIGH (ref 0.61–1.24)
GFR, Estimated: 35 mL/min — ABNORMAL LOW (ref >60)
Glucose, Bld: 237 mg/dL — ABNORMAL HIGH (ref 70–99)
Potassium: 3.8 mmol/L (ref 3.5–5.1)
Sodium: 135 mmol/L (ref 135–145)
Total Bilirubin: 0.4 mg/dL (ref 0.3–1.2)
Total Protein: 6.2 g/dL — ABNORMAL LOW (ref 6.5–8.1)

## 2022-03-23 LAB — PROCALCITONIN: Procalcitonin: <0.1 ng/mL

## 2022-03-23 LAB — HEMOGLOBIN A1C
Hgb A1c MFr Bld: 8.7 % — ABNORMAL HIGH (ref 4.8–5.6)
Mean Plasma Glucose: 202.99 mg/dL

## 2022-03-23 LAB — PHOSPHORUS: Phosphorus: 2.6 mg/dL (ref 2.5–4.6)

## 2022-03-23 LAB — MAGNESIUM: Magnesium: 1.7 mg/dL (ref 1.7–2.4)

## 2022-03-23 LAB — C-REACTIVE PROTEIN: CRP: 3.8 mg/dL — ABNORMAL HIGH (ref <1.0)

## 2022-03-23 MED ORDER — CLOPIDOGREL BISULFATE 75 MG PO TABS
75.0000 mg | ORAL_TABLET | Freq: Every day | ORAL | Status: DC
  Administered 2022-03-24 – 2022-03-26 (×3): 75 mg via ORAL
  Filled 2022-03-23 (×3): qty 1

## 2022-03-23 MED ORDER — MOLNUPIRAVIR EUA 200MG CAPSULE
4.0000 | ORAL_CAPSULE | Freq: Two times a day (BID) | ORAL | Status: DC
  Administered 2022-03-23 – 2022-03-26 (×8): 800 mg via ORAL
  Filled 2022-03-23: qty 4

## 2022-03-23 MED ORDER — MAGNESIUM SULFATE 2 GM/50ML IV SOLN
2.0000 g | Freq: Once | INTRAVENOUS | Status: AC
  Administered 2022-03-23: 2 g via INTRAVENOUS
  Filled 2022-03-23: qty 50

## 2022-03-23 MED ORDER — ALBUTEROL SULFATE HFA 108 (90 BASE) MCG/ACT IN AERS: 1.0000 | INHALATION_SPRAY | RESPIRATORY_TRACT | Status: DC | PRN

## 2022-03-23 MED ORDER — DOXAZOSIN MESYLATE 1 MG PO TABS: 1.0000 mg | ORAL_TABLET | Freq: Every day | ORAL | Status: DC

## 2022-03-23 MED ORDER — LOSARTAN POTASSIUM 25 MG PO TABS: 25.0000 mg | ORAL_TABLET | Freq: Every day | ORAL | Status: DC

## 2022-03-23 MED ORDER — TAMSULOSIN HCL 0.4 MG PO CAPS
0.4000 mg | ORAL_CAPSULE | Freq: Every day | ORAL | Status: DC
  Administered 2022-03-23 – 2022-03-26 (×4): 0.4 mg via ORAL
  Filled 2022-03-23 (×4): qty 1

## 2022-03-23 MED ORDER — HYDRALAZINE HCL 25 MG PO TABS
25.0000 mg | ORAL_TABLET | Freq: Four times a day (QID) | ORAL | Status: DC | PRN
  Administered 2022-03-24: 25 mg via ORAL
  Filled 2022-03-23: qty 1

## 2022-03-23 MED ORDER — ACETAMINOPHEN 650 MG RE SUPP: 650.0000 mg | Freq: Four times a day (QID) | RECTAL | Status: DC | PRN

## 2022-03-23 MED ORDER — LORATADINE 10 MG PO TABS
10.0000 mg | ORAL_TABLET | Freq: Every day | ORAL | Status: DC
  Administered 2022-03-23 – 2022-03-25 (×3): 10 mg via ORAL
  Filled 2022-03-23 (×3): qty 1

## 2022-03-23 MED ORDER — LEVOTHYROXINE SODIUM 75 MCG PO TABS
75.0000 ug | ORAL_TABLET | Freq: Every day | ORAL | Status: DC
  Administered 2022-03-24 – 2022-03-26 (×3): 75 ug via ORAL
  Filled 2022-03-23 (×3): qty 1

## 2022-03-23 MED ORDER — DIAZEPAM 5 MG PO TABS
2.5000 mg | ORAL_TABLET | Freq: Every evening | ORAL | Status: DC | PRN
  Filled 2022-03-23: qty 1

## 2022-03-23 MED ORDER — ROSUVASTATIN CALCIUM 10 MG PO TABS
20.0000 mg | ORAL_TABLET | Freq: Every day | ORAL | Status: DC
  Administered 2022-03-24 – 2022-03-26 (×3): 20 mg via ORAL
  Filled 2022-03-23 (×3): qty 2

## 2022-03-23 MED ORDER — INSULIN ASPART 100 UNIT/ML IJ SOLN
0.0000 [IU] | Freq: Three times a day (TID) | INTRAMUSCULAR | Status: DC
  Administered 2022-03-23 – 2022-03-24 (×2): 5 [IU] via SUBCUTANEOUS
  Administered 2022-03-24: 3 [IU] via SUBCUTANEOUS
  Administered 2022-03-24: 2 [IU] via SUBCUTANEOUS
  Administered 2022-03-25 (×3): 3 [IU] via SUBCUTANEOUS
  Administered 2022-03-26: 7 [IU] via SUBCUTANEOUS
  Administered 2022-03-26: 2 [IU] via SUBCUTANEOUS
  Filled 2022-03-23: qty 0.09

## 2022-03-23 MED ORDER — ACETAMINOPHEN 325 MG PO TABS
650.0000 mg | ORAL_TABLET | Freq: Four times a day (QID) | ORAL | Status: DC | PRN
  Administered 2022-03-23 – 2022-03-26 (×5): 650 mg via ORAL
  Filled 2022-03-23 (×6): qty 2

## 2022-03-23 MED ORDER — PANTOPRAZOLE SODIUM 40 MG PO TBEC
40.0000 mg | DELAYED_RELEASE_TABLET | Freq: Every day | ORAL | Status: DC
  Administered 2022-03-24 – 2022-03-26 (×3): 40 mg via ORAL
  Filled 2022-03-23 (×3): qty 1

## 2022-03-23 MED ORDER — INSULIN GLARGINE-YFGN 100 UNIT/ML ~~LOC~~ SOLN
8.0000 [IU] | Freq: Every day | SUBCUTANEOUS | Status: DC
  Administered 2022-03-24 – 2022-03-25 (×2): 8 [IU] via SUBCUTANEOUS
  Filled 2022-03-23 (×3): qty 0.08

## 2022-03-23 NOTE — H&P (Signed)
History and Physical    Patient: [name] [name] WUJ:[id number] DOB: 1933-03-12 DOA: 03/22/2022 DOS: the patient was seen and examined on 03/23/2022 PCP: [name], MD  Patient coming from: Home  Chief Complaint:  Chief Complaint  Patient presents with   Fever   Weakness   Covid Positive   HPI: [name] [name] is a 88 y.o. male with medical history significant of AAA, mild aortic insufficiency, BPH, celiac artery aneurysm, dementia, hypertension, hypothyroidism, type II DM, hiatal hernia, impaired hearing, hyperlipidemia, history of other nonhemorrhagic CVA who is coming to the emergency department with progressively worse generalized weakness, decreased oxygen saturation to the low 90s, fever of 102 F at home, decreased oral intake, sore throat, rhinorrhea, nonproductive cough and new incontinence.  The patient and his wife have tested positive for COVID-19.  His symptoms started about 3 days ago.  No fever, chills or night sweats. No  wheezing or hemoptysis.  No chest pain, palpitations, diaphoresis, PND, orthopnea or recent pitting edema of the lower extremities.  No abdominal pain, diarrhea, constipation, melena or hematochezia.  No flank pain, dysuria, frequency or hematuria.  No polyuria, polydipsia, polyphagia or blurred vision.  However, his blood glucose levels have been higher recently.  ED course: Initial vital signs were temperature 98.8 F, pulse 84, respirations 14, BP 161/71 mmHg O2 sat 97% on room air.  Patient received 1000 milliliters of normal saline bolus and I added magnesium sulfate 2 g IVPB.  Lab work: Urinalysis with glucosuria more than 500 mg deciliter, but otherwise unremarkable.  Coronavirus PCR was positive.  CBC showed a white count of 6.8 with 83% neutrophils, hemoglobin 13.3 g/dL platelets 145.  CMP showed normal electrolytes after calcium correction.  Glucose 137, BUN 25, creatinine 1.85 mg/dL.  Total protein 6.2 and albumin 3.1 g/dL.  The rest of  the hepatic functions were normal.  Magnesium was 1.7 and phosphorus 2.6 mg/dL.  Procalcitonin was normal.  Imaging: Portable 1 view chest radiograph with no active disease.   Review of Systems: As mentioned in the history of present illness. All other systems reviewed and are negative. Past Medical History:  Diagnosis Date   AAA (abdominal aortic aneurysm) ([place])    Aortic insufficiency    Mild to moderate by 2D echo 05/2021   Celiac artery aneurysm (HCC)    Diabetes mellitus without complication ([place])    Hiatal hernia    HOH (hard of hearing)    Hyperlipidemia    Stroke [facility])    Past Surgical History:  Procedure Laterality Date   ABDOMINAL AORTIC ANEURYSM REPAIR  10-2003   also had iliac aneurysm repair   CHOLECYSTECTOMY     Laparoscopic cholecystectomy   EYE SURGERY Bilateral    cataracts   HERNIA REPAIR     LOOP RECORDER INSERTION N/A 09/10/2017   Procedure: LOOP RECORDER INSERTION;  Surgeon: Deboraha Sprang, MD;  Location: [facility];  Service: Cardiovascular;  Laterality: N/A;   TEE WITHOUT CARDIOVERSION N/A 09/10/2017   Procedure: TRANSESOPHAGEAL ECHOCARDIOGRAM (TEE);  Surgeon: Jerline Pain, MD;  Location: [facility] ENDOSCOPY;  Service: Cardiovascular;  Laterality: N/A;   Social History:  reports that he has never smoked. He has never used smokeless tobacco. He reports that he does not drink alcohol and does not use drugs.  Allergies  Allergen Reactions   Actos [Pioglitazone] Swelling    Edema   Allegra [Fexofenadine] Other (See Comments)    stimulant   Iodine Swelling and Other (See Comments)  DEADLY SICK 01-05-2019 Pt stated he had IODINE Contrast 2 times without any problem.    Metformin    Metformin And Related Diarrhea    Pt states that he has a reaction to the extended release metformin but takes the regular   Onglyza [Saxagliptin] Other (See Comments)    Weight loss   Shellfish Allergy     DEADLY SICK    Family History  Problem Relation Age of Onset    Arthritis Mother        Rheumatoid arthritis    Prior to Admission medications   Medication Sig Start Date End Date Taking? Authorizing Provider  cholecalciferol (VITAMIN D) 1000 units tablet Take 1,000 Units by mouth daily with supper.   Yes [provider]  clopidogrel (PLAVIX) 75 MG tablet Take 1 tablet (75 mg total) by mouth daily. 09/11/17  Yes Rama, Venetia Maxon, MD  Coenzyme Q10 (COQ10) 100 MG CAPS Take 100 mg by mouth daily.   Yes [provider]  diazepam (VALIUM) 5 MG tablet Take 2.5-5 mg by mouth at bedtime as needed for anxiety (restless legs).   Yes [provider]  insulin glargine (LANTUS) 100 UNIT/ML injection Inject 8 Units into the skin daily.   Yes [provider]  levothyroxine (SYNTHROID, LEVOTHROID) 75 MCG tablet Take 75 mcg by mouth daily before breakfast. 1 tablet on weekdays and 1.5 tablets on weekends 09/15/13  Yes [provider]  loratadine (CLARITIN) 10 MG tablet Take 10 mg by mouth daily.   Yes [provider]  molnupiravir EUA (LAGEVRIO) 200 MG CAPS capsule Take 4 capsules by mouth 2 (two) times daily.   Yes [provider]  Multiple Vitamin (MULTIVITAMIN WITH MINERALS) TABS tablet Take 1 tablet by mouth daily.   Yes [provider]  Omega-3 1000 MG CAPS Take 1,000 mg by mouth daily.   Yes [provider]  pantoprazole (PROTONIX) 40 MG tablet Take 1 tablet (40 mg total) by mouth 2 (two) times daily before a meal. Patient taking differently: Take 40 mg by mouth daily. 11/30/17  Yes Debbe Odea, MD  rosuvastatin (CRESTOR) 20 MG tablet Take 20 mg by mouth daily.   Yes [provider]  vitamin B-12 (CYANOCOBALAMIN) 1000 MCG tablet Take 1,000 mcg by mouth daily.   Yes [provider]  ALPRAZolam (XANAX) 0.25 MG tablet Take 0.25 mg by mouth at bedtime as needed for anxiety (1/2 to 1 tablet once daily as needed). Patient not taking: Reported on 03/23/2022    [provider]  doxazosin (CARDURA) 1 MG tablet Take 1 tablet (1 mg total) by mouth daily. Patient not taking: Reported on 04/24/2021 12/01/17   Debbe Odea, MD  losartan (COZAAR) 25 MG tablet Take 25 mg by mouth daily. Patient not taking: Reported on 04/24/2021    [provider]  metFORMIN (GLUCOPHAGE) 500 MG tablet Take 1,000 mg by mouth 2 (two) times daily. Patient not taking: Reported on 04/24/2021 11/09/17   [provider]  saccharomyces boulardii (FLORASTOR) 250 MG capsule Take 1 capsule (250 mg total) by mouth 2 (two) times daily. Patient not taking: Reported on 04/24/2021 11/30/17   Debbe Odea, MD  sildenafil (REVATIO) 20 MG tablet Take 40-100 mg by mouth as needed (erectile dysfunction). For sexual activity Patient not taking: Reported on 04/24/2021 10/31/16   [provider]    Physical Exam: Vitals:   03/23/22 0500 03/23/22 0530 03/23/22 0556 03/23/22 0800  BP: (!) 148/72   (!) 145/57  Pulse:  80 79 76 67  Resp: '19 17 17 17  '$ Temp:      TempSrc:      SpO2: 94% 93% 91% 96%  Weight:      Height:       Physical Exam Vitals and nursing note reviewed.  Constitutional:      General: He is awake. He is not in acute distress.    Appearance: Normal appearance. He is normal weight. He is not toxic-appearing.  HENT:     Head: Normocephalic.     Nose: No rhinorrhea.     Mouth/Throat:     Mouth: Mucous membranes are moist.     Pharynx: Oropharynx is clear. Posterior oropharyngeal erythema present.  Eyes:     General: No scleral icterus.    Pupils: Pupils are equal, round, and reactive to light.  Neck:     Vascular: No JVD.  Cardiovascular:     Rate and Rhythm: Normal rate and regular rhythm.     Heart sounds: S1 normal and S2 normal.  Pulmonary:     Effort: Pulmonary effort is normal.     Breath sounds: Normal breath sounds.  Abdominal:     General: Bowel sounds are normal. There is no distension.     Palpations: Abdomen is soft.      Tenderness: There is no abdominal tenderness.  Musculoskeletal:     Cervical back: Neck supple.     Right lower leg: No edema.     Left lower leg: No edema.  Skin:    General: Skin is warm and dry.  Neurological:     General: No focal deficit present.     Mental Status: He is alert and oriented to person, place, and time.  Psychiatric:        Mood and Affect: Mood normal.        Behavior: Behavior normal. Behavior is cooperative.   Data Reviewed:  There are no new results to review at this time.  Assessment and Plan: Principal Problem:   Generalized weakness In the setting of:   COVID-19 virus infection Admit to telemetry/inpatient. Supplemental oxygen as needed. Bronchodilators as needed. Incentive spirometry while awake. Flutter valve exercises as needed. Antitussives as needed. No need for steroids. Molnupiravir p.o. twice daily. Follow-up CBC, CMP and chemistry. Follow-up inflammatory markers.  Active Problems:   Elevated serum creatinine Most recent measurement was 1.54 mg/dL in 2021. We will hold losartan in the meantime. Gentle IV hydration.  Monitor intake and output. Follow-up renal function electrolytes.    Hyperlipidemia Continue rosuvastatin 40 mg p.o. daily.    Essential hypertension Holding losartan. No longer taking Cardura. Hydralazine 25 mg p.o. every 6 hours as needed.    Type 2 diabetes mellitus with vascular disease (HCC) Carbohydrate modified diet. Hold metformin due to elevated creatinine. Continue Lantus 8 units SQ at bedtime.    Hypothyroidism Continue levothyroxine 75 mcg p.o. daily.    BPH (benign prostatic hyperplasia) We will begin tamsulosin.    Dementia ([place]) Supportive care.    Normocytic anemia Monitor hematocrit and hemoglobin.     Advance Care Planning:   Code Status: Full Code   Consults:   Family Communication: His daughter Crist Infante was at bedside and provided details of his HPI.  Severity of  Illness: The appropriate patient status for this patient is INPATIENT. Inpatient status is judged to be reasonable and necessary in order to provide the required intensity of service to ensure the patient's safety. The patient's presenting symptoms, physical  exam findings, and initial radiographic and laboratory data in the context of their chronic comorbidities is felt to place them at high risk for further clinical deterioration. Furthermore, it is not anticipated that the patient will be medically stable for discharge from the hospital within 2 midnights of admission.   * I certify that at the point of admission it is my clinical judgment that the patient will require inpatient hospital care spanning beyond 2 midnights from the point of admission due to high intensity of service, high risk for further deterioration and high frequency of surveillance required.*  Author: Reubin Milan, MD 03/23/2022 8:20 AM  For on call review [email or web address].   This document was prepared using Dragon voice recognition software and may contain some unintended transcription errors.

## 2022-03-23 NOTE — Progress Notes (Signed)
  Carryover admission to the Day Admitter.  I discussed this case with the EDP, Dr. Tyrone Nine.  Per these discussions:   This is an 88 year old male who is being admitted with generalized weakness in the setting of acute COVID-19 infection.  Had presented 2 the emergency department days ago with 2 days of generalized weakness and new onset nonproductive cough, at that time COVID-19 PCR was negative and additional work-up was also reportedly unremarkable.  However, here today discharged to home from the emergency department, he is noted further progression of his generalized weakness, in the absence of any acute physical neurologic deficits, resulting in significant impairment of his ability to ambulate, relative to his reported baseline in which she lives at home and is able to ambulate without assistance.  Denies any associated chest pain or shortness of breath.  Has not found to be COVID-positive, without any acute findings aside chest x-ray and no evidence of acute hypoxia to warrant initiation of systemic corticosteroids.  I have placed an order for inpatient admission to med telemetry for further evaluation and management of generalized weakness in the setting of acute COVID-19 infection.  Appears to meet criteria for incidental COVID-19 infection, as the reason for admission is generalized weakness.  Given the duration of the patient's cough/generalized weakness over the last 4 days, in the setting of the patient's age, I have ordered molnupiravir.   I have placed some additional preliminary admit orders via the adult multi-morbid admission order set.  We will also place orders for physical therapy consult in the morning as well as fall precautions.  Have ordered morning labs to include CMP, CBC with differential, serum magnesium and phosphorus levels.  Also completed the COVID-19 order set, placing associated orders for flutter valve, incentive spirometry, procalcitonin ago.  Albuterol inhaler. Will check  CRP now, and again with the morning labs.   Babs Bertin, DO Hospitalist

## 2022-03-23 NOTE — Progress Notes (Signed)
HOSPITAL MEDICINE OVERNIGHT EVENT NOTE    Notified by nursing that patient has been experiencing increasing lower abdominal discomfort with inability to urinate.  Apparently, patient has required a recent in and out catheterization on Tuesday for similar symptoms.  In and out catheterization performed this morning resulting in 750 cc of urine output with immediate resolution of patient's lower abdominal pain.  Urinalysis performed during the emergency department course reveals no evidence of infection.  Continue to perform bladder scans with in and out catheterization if needed.  Vernelle Emerald  MD [facility]

## 2022-03-23 NOTE — ED Notes (Signed)
Noted pt soiled self and linen. This Probation officer provided pericare and changed pt's brief. Linens changed and pt placed on male purewick. Pt comfort needs met. Daughter at bedside

## 2022-03-24 DIAGNOSIS — R531 Weakness: Secondary | ICD-10-CM | POA: Diagnosis not present

## 2022-03-24 LAB — GLUCOSE, CAPILLARY
Glucose-Capillary: 168 mg/dL — ABNORMAL HIGH (ref 70–99)
Glucose-Capillary: 222 mg/dL — ABNORMAL HIGH (ref 70–99)
Glucose-Capillary: 289 mg/dL — ABNORMAL HIGH (ref 70–99)
Glucose-Capillary: 233 mg/dL — ABNORMAL HIGH (ref 70–99)

## 2022-03-24 MED ORDER — PHENOL 1.4 % MT LIQD
1.0000 | OROMUCOSAL | Status: DC | PRN
  Administered 2022-03-24: 1 via OROMUCOSAL
  Filled 2022-03-24: qty 177

## 2022-03-24 MED ORDER — MENTHOL 3 MG MT LOZG
1.0000 | LOZENGE | OROMUCOSAL | Status: DC | PRN
  Filled 2022-03-24: qty 9

## 2022-03-24 MED ORDER — LOSARTAN POTASSIUM 25 MG PO TABS
12.5000 mg | ORAL_TABLET | Freq: Every day | ORAL | Status: DC
  Administered 2022-03-24 – 2022-03-26 (×3): 12.5 mg via ORAL
  Filled 2022-03-24 (×3): qty 0.5

## 2022-03-24 MED ORDER — VITAMIN B-12 1000 MCG PO TABS
1000.0000 ug | ORAL_TABLET | Freq: Every day | ORAL | Status: DC
  Administered 2022-03-24 – 2022-03-26 (×3): 1000 ug via ORAL
  Filled 2022-03-24 (×3): qty 1

## 2022-03-24 MED ORDER — ENOXAPARIN SODIUM 30 MG/0.3ML IJ SOSY
30.0000 mg | PREFILLED_SYRINGE | Freq: Every day | INTRAMUSCULAR | Status: DC
  Administered 2022-03-24 – 2022-03-26 (×3): 30 mg via SUBCUTANEOUS
  Filled 2022-03-24 (×3): qty 0.3

## 2022-03-24 NOTE — Progress Notes (Signed)
PROGRESS NOTE [name] [name]  DQQ:[id number] DOB: 26-Dec-1932 DOA: 03/22/2022 PCP: [name], MD   Brief Narrative/Hospital Course: 88 y.o. male with medical history significant of AAA, mild aortic insufficiency, BPH, celiac artery aneurysm, dementia, hypertension, hypothyroidism, type II DM, hiatal hernia, impaired hearing, hyperlipidemia, history of other nonhemorrhagic CVA presented with generalized weakness, decreased oxygen saturation to the low 90s, fever of 102 F at home, decreased oral intake, sore throat, rhinorrhea, nonproductive cough and new incontinence and  patient and his wife have tested positive for COVID-19. His symptoms started about 3 days PTA  ED course: Initial vital signs were temperature 98.8 F, pulse 84, respirations 14, BP 161/71 mmHg O2 sat 97% on room air.  Patient received 1000 milliliters of normal saline bolus and I added magnesium sulfate 2 g IVPB.  Lab work: Urinalysis with glucosuria more than 500 mg deciliter, but otherwise unremarkable.  Coronavirus PCR was positive.  CBC showed a white count of 6.8  platelets 145.  CMP showed normal electrolytes after calcium correction.  Glucose 137, BUN 25, creatinine 1.85 mg/dL.  Total protein 6.2 and albumin 3.1 g/dL.   LFTS normal.  Magnesium was 1.7 and phosphorus 2.6 mg/dL Procalcitonin was normal. Imaging: Portable 1 view chest radiograph with no active disease. Patient was admitted for COVID-19 infection with symptomatic generalized weakness decreased oral intake failure to thrive.    Subjective: Seen and examined this morning.  Daughter who is a Designer, jewellery is at the bedside.  Patient is alert awake, feeling pretty well this morning pleasant. He is on room air Still has some weakness generalized   Assessment and Plan: Principal Problem:   Generalized weakness Active Problems:   Hyperlipidemia   Essential hypertension   Type 2 diabetes mellitus with vascular disease (HCC)   Hypothyroidism   BPH  (benign prostatic hyperplasia)   Dementia (HCC)   COVID-19 virus infection   Elevated serum creatinine   Normocytic anemia   COVID-19 infection Generalized weakness Poor oral intake and failure to thrive: He is not hypoxic, cxr clear.Continue molnupiravir, supportive care.  Procalcitonin is negative. Did have fever last night.  Monitor inflammatory markers trend as below.  Follow-up blood culture.  Encourage oral intake appears to be clinically improving, obtain PT OT evaluation today  CKD stage IIIb elevated creatinine 1.5 in 2021 now  in 1.9: Per daughter who is a nurse doctor this is about his baseline followed by [name] kidney  Essential hypertension/hyperlipidemia: Resume losartan low-dose, continue Crestor. Dementia: Moos stable alert awake BPH: Continue home meds Normocytic anemia: Overall stable T2DM: A1c blood sugar as below.  On regular diet if blood sugar worsening change to diabetic diet, continue sliding scale insulin for now Recent Labs  Lab 03/23/22 0500 03/23/22 1908 03/23/22 2303 03/24/22 0811  GLUCAP  --  262* 115* 168*  HGBA1C 8.7*  --   --   --     DVT prophylaxis: enoxaparin (LOVENOX) injection 30 mg Start: 03/24/22 1100 SCDs Start: 03/23/22 0021 Code Status:   Code Status: Full Code Family Communication: plan of care discussed with patient/daughter at bedside. Patient status is: Inpatient because of ongoing management of generalized weakness COVID infection Level of care: Telemetry   Dispo: The patient is from: home w/ wife            Anticipated disposition: home in 2 days  Mobility Assessment (last 72 hours)     Mobility Assessment     Row Name 03/23/22 2312 03/23/22 1801  Does patient have an order for bedrest or is patient medically unstable No - Continue assessment No - Continue assessment      What is the highest level of mobility based on the progressive mobility assessment? Level 2 (Chairfast) - Balance while sitting on  edge of bed and cannot stand --      Is the above level different from baseline mobility prior to current illness? Yes - Recommend PT order --                Objective: Vitals last 24 hrs: Vitals:   03/23/22 1934 03/24/22 0500 03/24/22 0501 03/24/22 0550  BP: (!) 148/67  (!) 171/88 (!) 179/80  Pulse: 75  83 81  Resp: 18  18   Temp: 100.2 F (37.9 C)  99 F (37.2 C)   TempSrc: Oral  Oral   SpO2: 96%  97%   Weight:  60.9 kg    Height:       Weight change: 0.118 kg  Physical Examination: General exam: alert awake,older than stated age, weak appearing. HEENT:Oral mucosa moist, Ear/Nose WNL grossly, dentition normal. Respiratory system: bilaterally diminished BS, no use of accessory muscle Cardiovascular system: S1 & S2 +, No JVD. Gastrointestinal system: Abdomen soft,NT,ND, BS+ Nervous System:Alert, awake, moving extremities and grossly nonfocal Extremities: LE edema neg,distal peripheral pulses palpable.  Skin: No rashes,no icterus. MSK: Normal muscle bulk,tone, power  Medications reviewed:  Scheduled Meds:  clopidogrel  75 mg Oral Daily   cyanocobalamin  1,000 mcg Oral Daily   enoxaparin (LOVENOX) injection  30 mg Subcutaneous Daily   insulin aspart  0-9 Units Subcutaneous TID WC   insulin glargine-yfgn  8 Units Subcutaneous QHS   levothyroxine  75 mcg Oral Q0600   loratadine  10 mg Oral QHS   losartan  12.5 mg Oral Daily   molnupiravir EUA  4 capsule Oral BID   pantoprazole  40 mg Oral Daily   rosuvastatin  20 mg Oral Daily   tamsulosin  0.4 mg Oral Daily  Continuous Infusions:   Diet Order             Diet regular Room service appropriate? Yes; Fluid consistency: Thin  Diet effective now                  Intake/Output Summary (Last 24 hours) at 03/24/2022 1101 Last data filed at 03/24/2022 0830 Gross per 24 hour  Intake 527.03 ml  Output 1600 ml  Net -1072.97 ml   Net IO Since Admission: -1,054.26 mL [03/24/22 1101]  Wt Readings from Last 3  Encounters:  03/24/22 60.9 kg  03/20/22 60.8 kg  05/18/21 57.2 kg     Unresulted Labs (From admission, onward)    None     Data Reviewed: I have personally reviewed following labs and imaging studies CBC: Recent Labs  Lab 03/20/22 0435 03/22/22 2219 03/22/22 2242 03/23/22 0500  WBC 10.9* 6.8  --  5.5  NEUTROABS 8.9* 5.6  --  4.1  HGB 13.2 13.3 13.6 12.1*  HCT 36.8* 38.0* 40.0 34.5*  MCV 92.9 95.0  --  94.0  PLT 150 145*  --  767   Basic Metabolic Panel: Recent Labs  Lab 03/20/22 0435 03/22/22 2242 03/23/22 0500  NA 135 133* 135  K 4.5 4.3 3.8  CL 103 100 107  CO2 22  --  22  GLUCOSE 203* 282* 237*  BUN 36* 24* 25*  CREATININE 2.01* 1.90* 1.85*  CALCIUM 9.3  --  8.6*  MG  --   --  1.7  PHOS  --   --  2.6   GFR: Estimated Creatinine Clearance: 22.6 mL/min (A) (by C-G formula based on SCr of 1.85 mg/dL (H)). Liver Function Tests: Recent Labs  Lab 03/20/22 0435 03/23/22 0500  AST 19 19  ALT 17 17  ALKPHOS 89 60  BILITOT 0.5 0.4  PROT 6.7 6.2*  ALBUMIN 4.1 3.1*   No results for input(s): "LIPASE", "AMYLASE" in the last 168 hours. No results for input(s): "AMMONIA" in the last 168 hours. Coagulation Profile: Recent Labs  Lab 03/20/22 0435  INR 1.1   BNP (last 3 results) No results for input(s): "PROBNP" in the last 8760 hours. HbA1C: Recent Labs    03/23/22 0500  HGBA1C 8.7*   CBG: Recent Labs  Lab 03/23/22 1908 03/23/22 2303 03/24/22 0811  GLUCAP 262* 115* 168*   Lipid Profile: No results for input(s): "CHOL", "HDL", "LDLCALC", "TRIG", "CHOLHDL", "LDLDIRECT" in the last 72 hours. Thyroid Function Tests: No results for input(s): "TSH", "T4TOTAL", "FREET4", "T3FREE", "THYROIDAB" in the last 72 hours. Sepsis Labs: Recent Labs  Lab 03/20/22 0435 03/23/22 0500  PROCALCITON  --  <0.10  LATICACIDVEN 1.0  --     Recent Results (from the past 240 hour(s))  Blood Culture (routine x 2)     Status: None (Preliminary result)   Collection  Time: 03/20/22  4:27 AM   Specimen: BLOOD  Result Value Ref Range Status   Specimen Description   Final    BLOOD LEFT ANTECUBITAL Performed at [facility], [address], [place], [place] [address]    Special Requests   Final    BOTTLES DRAWN AEROBIC AND ANAEROBIC Blood Culture adequate volume Performed at [facility], [address], [place], [place] [address]    Culture   Final    NO GROWTH 4 DAYS Performed at [facility], [place] [address], [place], [place] [address]    Report Status PENDING  Incomplete  Blood Culture (routine x 2)     Status: None (Preliminary result)   Collection Time: 03/20/22  4:27 AM   Specimen: BLOOD  Result Value Ref Range Status   Specimen Description   Final    BLOOD BLOOD LEFT FOREARM Performed at [facility], [address], [place], [place] [address]    Special Requests   Final    BOTTLES DRAWN AEROBIC AND ANAEROBIC Blood Culture adequate volume Performed at [facility], [address], [place], [place] [address]    Culture   Final    NO GROWTH 4 DAYS Performed at [facility], [place] [address], [place], [place] [address]    Report Status PENDING  Incomplete  SARS Coronavirus 2 by RT PCR (hospital order, performed in [facility] hospital lab) *cepheid single result test* Anterior Nasal Swab     Status: None   Collection Time: 03/20/22  4:35 AM   Specimen: Anterior Nasal Swab  Result Value Ref Range Status   SARS Coronavirus 2 by RT PCR NEGATIVE NEGATIVE Final    Comment: (NOTE) SARS-CoV-2 target nucleic acids are NOT DETECTED.  The SARS-CoV-2 RNA is generally detectable in upper and lower respiratory specimens during the acute phase of infection. The lowest concentration of SARS-CoV-2 viral copies this assay can detect is 250 copies / mL. A negative result does not preclude SARS-CoV-2 infection and should not be used as the sole basis for treatment  or other patient management decisions.  A negative result may occur with improper specimen collection / handling, submission of specimen other than nasopharyngeal swab, presence of viral mutation(s) within the areas targeted by this assay, and inadequate number of viral copies (<250 copies / mL). A negative result must be combined with clinical observations, patient history, and epidemiological information.  Fact Sheet for Patients:   https://www.patel.info/  Fact Sheet for Healthcare Providers: https://hall.com/  This test is not yet approved or  cleared by the [place] FDA and has been authorized for detection and/or diagnosis of SARS-CoV-2 by FDA under an Emergency Use Authorization (EUA).  This EUA will remain in effect (meaning this test can be used) for the duration of the COVID-19 declaration under Section 564(b)(1) of the Act, 21 U.S.C. section 360bbb-3(b)(1), unless the authorization is terminated or revoked sooner.  Performed at [facility], [address], [place], [place] [address]   Urine Culture     Status: None   Collection Time: 03/20/22  5:43 AM   Specimen: In/Out Cath Urine  Result Value Ref Range Status   Specimen Description   Final    IN/OUT CATH URINE Performed at [facility], [address] [place], [place] [address]    Special Requests   Final    NONE Performed at [facility], [address], [place], [place] [address]    Culture   Final    NO GROWTH Performed at [facility], [place] [address] [place], [place] [address]    Report Status 03/21/2022 FINAL  Final  SARS Coronavirus 2 by RT PCR (hospital order, performed in [facility] hospital lab) *cepheid single result test* Anterior Nasal Swab     Status: Abnormal   Collection Time: 03/22/22 10:19 PM   Specimen: Anterior Nasal Swab  Result Value Ref Range Status   SARS Coronavirus 2  by RT PCR POSITIVE (A) NEGATIVE Final    Comment: (NOTE) SARS-CoV-2 target nucleic acids are DETECTED  SARS-CoV-2 RNA is generally detectable in upper respiratory specimens  during the acute phase of infection.  Positive results are indicative  of the presence of the identified virus, but do not rule out bacterial infection or co-infection with other pathogens not detected by the test.  Clinical correlation with patient history and  other diagnostic information is necessary to determine patient infection status.  The expected result is negative.  Fact Sheet for Patients:   https://www.patel.info/   Fact Sheet for Healthcare Providers:   https://hall.com/    This test is not yet approved or cleared by the [place] FDA and  has been authorized for detection and/or diagnosis of SARS-CoV-2 by FDA under an Emergency Use Authorization (EUA).  This EUA will remain in effect (meaning this test can be used) for the duration of  the COVID-19 declaration under Section 564(b)(1)  of the Act, 21 U.S.C. section 360-bbb-3(b)(1), unless the authorization is terminated or revoked sooner.   Performed at [facility], [place] [address] [place], [place] [address]     Antimicrobials: Anti-infectives (From admission, onward)    Start     Dose/Rate Route Frequency Ordered Stop   03/23/22 0030  molnupiravir EUA (LAGEVRIO) capsule 800 mg        4 capsule Oral 2 times daily 03/23/22 0023 03/27/22 2159      Culture/Microbiology    Component Value Date/Time   SDES  03/20/2022 0543    IN/OUT CATH URINE Performed at [facility], [address] [place], [place]  [place]  03/20/2022 0543    NONE Performed at [facility], [address] [place], [place] [address]    CULT  03/20/2022 0543    NO GROWTH Performed at [facility] [address], [place], [place]  [address]    REPTSTATUS 03/21/2022 FINAL 03/20/2022 0543    Other culture-see note  Radiology Studies: DG Chest Port 1 View  Result Date: 03/22/2022 CLINICAL DATA:  Shortness of breath EXAM: PORTABLE CHEST 1 VIEW COMPARISON:  Chest x-ray 12/30/2017 FINDINGS: Loop recorder device overlies the left chest, new from prior. The heart size and mediastinal contours are within normal limits. Both lungs are clear. The visualized skeletal structures are unremarkable. IMPRESSION: No active disease. Electronically Signed   By: Ronney Asters M.D.   On: 03/22/2022 22:37     LOS: 1 day   Antonieta Pert, MD [facility]  03/24/2022, 11:01 AM

## 2022-03-24 NOTE — Plan of Care (Signed)
  Problem: Education: Goal: Knowledge of General Education information will improve Description: Including pain rating scale, medication(s)/side effects and non-pharmacologic comfort measures Outcome: Progressing   Problem: Elimination: Goal: Will not experience complications related to urinary retention Outcome: Progressing   Problem: Pain Managment: Goal: General experience of comfort will improve Outcome: Progressing   Problem: Safety: Goal: Ability to remain free from injury will improve Outcome: Progressing   

## 2022-03-24 NOTE — Hospital Course (Addendum)
88 y.o. male with medical history significant of AAA, mild aortic insufficiency, BPH, celiac artery aneurysm, dementia, hypertension, hypothyroidism, type II DM, hiatal hernia, impaired hearing, hyperlipidemia, history of other nonhemorrhagic CVA presented with generalized weakness, decreased oxygen saturation to the low 90s, fever of 102 F at home, decreased oral intake, sore throat, rhinorrhea, nonproductive cough and new incontinence and  patient and his wife have tested positive for COVID-19. His symptoms started about 3 days PTA  ED course: Initial vital signs were temperature 98.8 F, pulse 84, respirations 14, BP 161/71 mmHg O2 sat 97% on room air.  Patient received 1000 milliliters of normal saline bolus and I added magnesium sulfate 2 g IVPB.  Lab work: Urinalysis with glucosuria more than 500 mg deciliter, but otherwise unremarkable.  Coronavirus PCR was positive.  CBC showed a white count of 6.8  platelets 145.CMP showed normal electrolytes after calcium correction.  Glucose 137, BUN 25, creatinine 1.85 mg/dL.Total protein 6.2 and albumin 3.1 g/dL. LFTS normal.  Magnesium was 1.7 and phosphorus 2.6 mg/dL Procalcitonin was normal. Imaging: Portable 1 view chest radiograph with no active disease.Patient was admitted for COVID-19 infection with symptomatic generalized weakness decreased oral intake. He was managed conservatively with lagevrio.  At this time patient is clinically improved remains off oxygen respiratory status stable with mild cognitive impairment at baseline initially deconditioning but improved with PT OT work-up no further PT OT needed.  His wife also tested positive at home and doing well.  His labs with creatinine improved to 1.7 blood pressure well controlled. is medical history for discharge home, POC discussed with patient's daughter and is in agreement.

## 2022-03-24 NOTE — Evaluation (Signed)
Physical Therapy Evaluation Patient Details Name: [name] [name] MRN: [id number] DOB: Jun 04, 1933 Today's Date: 03/24/2022  History of Present Illness  88 yo admit 03/22/2022 with fever and generalized weakness found to have COVID.PMH: HTN, Stroke  Clinical Impression  Pt tolerated moving in room with supervision and RW at this time. Pt showed no SOB or dyspnea with walking, and was on RA at 96%. Walked to bathroom did report continues to have burning pain while trying to urinate and sometimes unable to urinate. Pt uses RW at baseline and no LOB with our multiple rounds int eh room. Will sign off PT and have mobility specialists continue to work with pt mobilizing and ambulating him while he is here.        Recommendations for follow up therapy are one component of a multi-disciplinary discharge planning process, led by the attending physician.  Recommendations may be updated based on patient status, additional functional criteria and insurance authorization.  Follow Up Recommendations No PT follow up      Assistance Recommended at Discharge Intermittent Supervision/Assistance  Patient can return home with the following  A little help with walking and/or transfers;A little help with bathing/dressing/bathroom;Assist for transportation    Equipment Recommendations None recommended by PT  Recommendations for Other Services       Functional Status Assessment Patient has not had a recent decline in their functional status     Precautions / Restrictions Precautions Precautions: None      Mobility  Bed Mobility Overal bed mobility: Independent                  Transfers Overall transfer level: Modified independent Equipment used: Rolling walker (2 wheels)                    Ambulation/Gait Ambulation/Gait assistance: Supervision Gait Distance (Feet): 50 Feet (in room due to pt didn't want to go into hallway today) Assistive device: Rolling walker (2  wheels) Gait Pattern/deviations: Step-through pattern       General Gait Details: did well with turning mulitiple times int eh room while we were making loops int eh room with walking. no LOB and able to talk and walk at same time as well.  Stairs            Wheelchair Mobility    Modified Rankin (Stroke Patients Only)       Balance                                             Pertinent Vitals/Pain Pain Assessment Pain Assessment: Faces Faces Pain Scale: Hurts a little bit Pain Location: in penis when trying to urinate Pain Descriptors / Indicators: Burning    Home Living Family/patient expects to be discharged to:: Private residence Living Arrangements: Spouse/significant other Available Help at Discharge: Family;Available 24 hours/day (wife uses RW as well, but dtr lives nearby and is a NP) Type of Home: House Home Access: Stairs to enter Entrance Stairs-Rails: None Entrance Stairs-Number of Steps: 1   Home Layout: One level Home Equipment: Conservation officer, nature (2 wheels);Rollator (4 wheels)      Prior Function Prior Level of Function : Independent/Modified Independent             Mobility Comments: pt stated he was independent , drives, however recetly began using rollator for ambulation. Ftr helps bring meals when needed.  Hand Dominance        Extremity/Trunk Assessment        Lower Extremity Assessment Lower Extremity Assessment: Overall WFL for tasks assessed       Communication   Communication: No difficulties  Cognition Arousal/Alertness: Awake/alert Behavior During Therapy: WFL for tasks assessed/performed Overall Cognitive Status: Within Functional Limits for tasks assessed                                          General Comments      Exercises     Assessment/Plan    PT Assessment Patient does not need any further PT services  PT Problem List Decreased activity tolerance       PT  Treatment Interventions      PT Goals (Current goals can be found in the Care Plan section)  Acute Rehab PT Goals PT Goal Formulation: All assessment and education complete, DC therapy    Frequency       Co-evaluation               AM-PAC PT "6 Clicks" Mobility  Outcome Measure Help needed turning from your back to your side while in a flat bed without using bedrails?: None   Help needed moving to and from a bed to a chair (including a wheelchair)?: None Help needed standing up from a chair using your arms (e.g., wheelchair or bedside chair)?: A Little Help needed to walk in hospital room?: A Little Help needed climbing 3-5 steps with a railing? : A Little 6 Click Score: 17    End of Session Equipment Utilized During Treatment: Gait belt Activity Tolerance: Patient tolerated treatment well Patient left: in chair;with call bell/phone within reach;with chair alarm set Nurse Communication: Mobility status      Time: 1530-1600 PT Time Calculation (min) (ACUTE ONLY): 30 min   Charges:   PT Evaluation $PT Eval Low Complexity: 1 Low PT Treatments $Gait Training: 8-22 mins        Gatha Mayer, PT, MPT Acute Rehabilitation Services Office: [phone] If a weekend: [facility] w/e pager [phone] 03/24/2022   Clide Dales 03/24/2022, 5:31 PM

## 2022-03-25 DIAGNOSIS — R531 Weakness: Secondary | ICD-10-CM | POA: Diagnosis not present

## 2022-03-25 LAB — BASIC METABOLIC PANEL
Anion gap: 8 (ref 5–15)
BUN: 34 mg/dL — ABNORMAL HIGH (ref 8–23)
CO2: 22 mmol/L (ref 22–32)
Calcium: 8.8 mg/dL — ABNORMAL LOW (ref 8.9–10.3)
Chloride: 105 mmol/L (ref 98–111)
Creatinine, Ser: 2.04 mg/dL — ABNORMAL HIGH (ref 0.61–1.24)
GFR, Estimated: 31 mL/min — ABNORMAL LOW (ref >60)
Glucose, Bld: 276 mg/dL — ABNORMAL HIGH (ref 70–99)
Potassium: 3.8 mmol/L (ref 3.5–5.1)
Sodium: 135 mmol/L (ref 135–145)

## 2022-03-25 LAB — GLUCOSE, CAPILLARY
Glucose-Capillary: 203 mg/dL — ABNORMAL HIGH (ref 70–99)
Glucose-Capillary: 214 mg/dL — ABNORMAL HIGH (ref 70–99)
Glucose-Capillary: 228 mg/dL — ABNORMAL HIGH (ref 70–99)
Glucose-Capillary: 248 mg/dL — ABNORMAL HIGH (ref 70–99)

## 2022-03-25 NOTE — Evaluation (Signed)
Occupational Therapy Evaluation Patient Details Name: [name] [name] MRN: [id number] DOB: 1933/03/07 Today's Date: 03/25/2022   History of Present Illness Patient is a 88 year old male who was admitted on 03/22/2022 with fever and generalized weakness found to have COVID.PMH: HTN, Stroke   Clinical Impression   Patient evaluated by Occupational Therapy with no further acute OT needs identified. All education has been completed and the patient has no further questions. Patient endorsed being at baseline for ADLs. Patient did need supervision for ADLs to ensure cleanliness after loose BM but able to physically complete all tasks.  See below for any follow-up Occupational Therapy or equipment needs. OT is signing off. Thank you for this referral.      Recommendations for follow up therapy are one component of a multi-disciplinary discharge planning process, led by the attending physician.  Recommendations may be updated based on patient status, additional functional criteria and insurance authorization.   Follow Up Recommendations  No OT follow up    Assistance Recommended at Discharge Intermittent Supervision/Assistance  Patient can return home with the following A little help with bathing/dressing/bathroom;Assistance with cooking/housework;Direct supervision/assist for financial management;Assist for transportation;Help with stairs or ramp for entrance;Direct supervision/assist for medications management    Functional Status Assessment  Patient has had a recent decline in their functional status and demonstrates the ability to make significant improvements in function in a reasonable and predictable amount of time.  Equipment Recommendations  None recommended by OT    Recommendations for Other Services       Precautions / Restrictions Precautions Precautions: None Restrictions Weight Bearing Restrictions: No      Mobility Bed Mobility Overal bed mobility: Independent                   Transfers Overall transfer level: Modified independent Equipment used: Rolling walker (2 wheels)                      Balance                                           ADL either performed or assessed with clinical judgement   ADL Overall ADL's : At baseline                                       General ADL Comments: patient was supervision for ADL tasks on this date with patient reporting it takes increased time to complete at this time. patient required no physical assist to stand and brush teeth, LB dressing transfers or clothing managment during session. patient reported he likes the rollator better than RW. patient noted to have some confusion when toietling with paient educated on not using hand while washcloth was beign warmed to wash R hand after fecal matter was on hand. patient attempted to scratch face x2 with hand while therapist had to continuously remind patient not to use that hand until after he cleaned it. patient verbalized understanding. patietn was able to wash hand with min guard with cues for cleanliness.  patient endorsed being at baseline other than it taking more time to complete tasks.     Vision Baseline Vision/History: 1 Wears glasses Patient Visual Report: No change from baseline       Perception  Praxis      Pertinent Vitals/Pain Pain Assessment Pain Assessment: No/denies pain     Hand Dominance     Extremity/Trunk Assessment Upper Extremity Assessment Upper Extremity Assessment: Overall WFL for tasks assessed   Lower Extremity Assessment Lower Extremity Assessment: Defer to PT evaluation   Cervical / Trunk Assessment Cervical / Trunk Assessment: Normal   Communication Communication Communication: No difficulties   Cognition Arousal/Alertness: Awake/alert Behavior During Therapy: WFL for tasks assessed/performed Overall Cognitive Status: Within Functional Limits for tasks  assessed                                       General Comments       Exercises     Shoulder Instructions      Home Living Family/patient expects to be discharged to:: Private residence Living Arrangements: Spouse/significant other Available Help at Discharge: Family;Available 24 hours/day Type of Home: House Home Access: Stairs to enter [employer] of Steps: 1 Entrance Stairs-Rails: None Home Layout: One level               Home Equipment: Conservation officer, nature (2 wheels);Rollator (4 wheels)          Prior Functioning/Environment Prior Level of Function : Independent/Modified Independent             Mobility Comments: pt stated he was independent , drives, however recetly began using rollator for ambulation. daughter helps bring meals when needed.          OT Problem List:        OT Treatment/Interventions:      OT Goals(Current goals can be found in the care plan section) Acute Rehab OT Goals Patient Stated Goal: to go home OT Goal Formulation: All assessment and education complete, DC therapy  OT Frequency:      Co-evaluation              AM-PAC OT "6 Clicks" Daily Activity     Outcome Measure Help from another person eating meals?: None Help from another person taking care of personal grooming?: None Help from another person toileting, which includes using toliet, bedpan, or urinal?: A Little Help from another person bathing (including washing, rinsing, drying)?: A Little (supervision) Help from another person to put on and taking off regular upper body clothing?: A Little Help from another person to put on and taking off regular lower body clothing?: A Little 6 Click Score: 20   End of Session Equipment Utilized During Treatment: Rolling walker (2 wheels) Nurse Communication: Other (comment) (confusion during session and loose stool episode)  Activity Tolerance: Patient tolerated treatment well Patient left: in  bed;with call bell/phone within reach  OT Visit Diagnosis: Unsteadiness on feet (R26.81)                Time: 9024-0973 OT Time Calculation (min): 38 min Charges:  OT General Charges $OT Visit: 1 Visit OT Evaluation $OT Eval Low Complexity: 1 Low OT Treatments $Self Care/Home Management : 23-37 mins  Rennie Plowman, MS Acute Rehabilitation Department Office# [phone]   Marcellina Millin 03/25/2022, 3:43 PM

## 2022-03-25 NOTE — Progress Notes (Signed)
  Transition of Care (TOC) Screening Note   Patient Details  Name: [name] [name] Date of Birth: Nov 21, 1932   Transition of Care [facility]) CM/SW Contact:    Vassie Moselle, LCSW Phone Number: 03/25/2022, 2:52 PM    Transition of Care Department [facility]) has reviewed patient and no TOC needs have been identified at this time. We will continue to monitor patient advancement through interdisciplinary progression rounds. If new patient transition needs arise, please place a TOC consult.

## 2022-03-25 NOTE — Progress Notes (Signed)
Mobility Specialist - Progress Note   03/25/22 1240  Mobility  Activity Ambulated with assistance in room  Level of Assistance Standby assist, set-up cues, supervision of patient - no hands on  Assistive Device Four wheel walker  Distance Ambulated (ft) 350 ft  Activity Response Tolerated well  $Mobility charge 1 Mobility   Pt received in chair and agreed for mobility. No c/o pain nor discomfort during ambulation. Some dizziness, due to spinning around in room so much. Pt to bed with all needs met and NT notified of session.   Roderick Pee Mobility Specialist

## 2022-03-25 NOTE — Progress Notes (Signed)
PROGRESS NOTE [name] [name]  HOZ:[id number] DOB: 29-Apr-1933 DOA: 03/22/2022 PCP: [name], MD   Brief Narrative/Hospital Course: 88 y.o. male with medical history significant of AAA, mild aortic insufficiency, BPH, celiac artery aneurysm, dementia, hypertension, hypothyroidism, type II DM, hiatal hernia, impaired hearing, hyperlipidemia, history of other nonhemorrhagic CVA presented with generalized weakness, decreased oxygen saturation to the low 90s, fever of 102 F at home, decreased oral intake, sore throat, rhinorrhea, nonproductive cough and new incontinence and  patient and his wife have tested positive for COVID-19. His symptoms started about 3 days PTA  ED course: Initial vital signs were temperature 98.8 F, pulse 84, respirations 14, BP 161/71 mmHg O2 sat 97% on room air.  Patient received 1000 milliliters of normal saline bolus and I added magnesium sulfate 2 g IVPB.  Lab work: Urinalysis with glucosuria more than 500 mg deciliter, but otherwise unremarkable.  Coronavirus PCR was positive.  CBC showed a white count of 6.8  platelets 145.  CMP showed normal electrolytes after calcium correction.  Glucose 137, BUN 25, creatinine 1.85 mg/dL.  Total protein 6.2 and albumin 3.1 g/dL.   LFTS normal.  Magnesium was 1.7 and phosphorus 2.6 mg/dL Procalcitonin was normal. Imaging: Portable 1 view chest radiograph with no active disease. Patient was admitted for COVID-19 infection with symptomatic generalized weakness decreased oral intake failure to thrive.    Subjective: Seen and examined this morning.  Reports he feels unsteady in his feet He does not feel ready for home yet Overnight Tmax 100.3.  On room air.  Worked with PT OT.   Assessment and Plan: Principal Problem:   Generalized weakness Active Problems:   Hyperlipidemia   Essential hypertension   Type 2 diabetes mellitus with vascular disease (HCC)   Hypothyroidism   BPH (benign prostatic hyperplasia)   Dementia (HCC)    COVID-19 virus infection   Elevated serum creatinine   Normocytic anemia   COVID-19 infection Generalized weakness Poor oral intake and failure to thrive: Remains clinically stable not hypoxic chest x-ray clear.  Continue molnupiravir, supportive care.  Procalcitonin is negative.  Low-grade fever again, with generalized weakness.  Continue PT OT.  Encourage oral intake.  Urine and blood culture no growth so far.  CKD stage IIIb elevated creatinine 1.5 in 2021 now  in 1.9: Per daughter who is a Designer, jewellery this is about his baseline followed by [name] kidney.  Monitor.  Essential hypertension/hyperlipidemia: BP controlled after resuming losartan low-dose.continue Crestor.  Monitor blood pressure and Dementia: Mood stable alert awake BPH: Continue home meds Normocytic anemia: Overall stable hb. T2DM: A1c blood sugar as below.  Blood sugar poorly controlled changed to diabetic diet continue sliding scale  Recent Labs  Lab 03/23/22 0500 03/23/22 1908 03/24/22 0811 03/24/22 1149 03/24/22 1732 03/24/22 2101 03/25/22 0834  GLUCAP  --    < > 168* 222* 289* 233* 203*  HGBA1C 8.7*  --   --   --   --   --   --    < > = values in this interval not displayed.     DVT prophylaxis: enoxaparin (LOVENOX) injection 30 mg Start: 03/24/22 1100 SCDs Start: 03/23/22 0021 Code Status:   Code Status: Full Code Family Communication: plan of care discussed with patient/daughter at bedside. Patient status is: Inpatient because of ongoing management of generalized weakness COVID infection Level of care: Telemetry   Dispo: The patient is from: home w/ wife, wife has covid and unable to take are of him,  he would like to get little bit stronger before going home            Anticipated disposition: home tomorrow.  Mobility Assessment (last 72 hours)     Mobility Assessment     Row Name 03/24/22 2130 03/24/22 1728 03/24/22 1000 03/23/22 2312 03/23/22 1801   Does patient have an order  for bedrest or is patient medically unstable No - Continue assessment -- No - Continue assessment No - Continue assessment No - Continue assessment   What is the highest level of mobility based on the progressive mobility assessment? Level 5 (Walks with assist in room/hall) - Balance while stepping forward/back and can walk in room with assist - Complete Level 5 (Walks with assist in room/hall) - Balance while stepping forward/back and can walk in room with assist - Complete Level 4 (Walks with assist in room) - Balance while marching in place and cannot step forward and back - Complete Level 2 (Chairfast) - Balance while sitting on edge of bed and cannot stand --   Is the above level different from baseline mobility prior to current illness? -- -- Yes - Recommend PT order Yes - Recommend PT order --             Objective: Vitals last 24 hrs: Vitals:   03/24/22 1326 03/24/22 1724 03/24/22 2105 03/25/22 0548  BP: (!) 145/62  135/77 (!) 110/91  Pulse: 95  83 77  Resp: '20  16 20  '$ Temp: 100.3 F (37.9 C)  99.7 F (37.6 C) 98.8 F (37.1 C)  TempSrc: Oral  Oral Oral  SpO2: 95% 95% 95% 94%  Weight:    60.9 kg  Height:       Weight change: 0 kg  Physical Examination: General exam: AAox, older than stated age, weak appearing. HEENT:Oral mucosa moist, Ear/Nose WNL grossly, dentition normal. Respiratory system: bilaterally clear,no use of accessory muscle Cardiovascular system: S1 & S2 +, No JVD,. Gastrointestinal system: Abdomen soft,NT,ND,BS+ Nervous System:Alert, awake, moving extremities and grossly nonfocal Extremities: LE ankle edema neg, distal peripheral pulses palpable.  Skin: No rashes,no icterus. MSK: Normal muscle bulk,tone, power   Medications reviewed:  Scheduled Meds:  clopidogrel  75 mg Oral Daily   cyanocobalamin  1,000 mcg Oral Daily   enoxaparin (LOVENOX) injection  30 mg Subcutaneous Daily   insulin aspart  0-9 Units Subcutaneous TID WC   insulin glargine-yfgn  8  Units Subcutaneous QHS   levothyroxine  75 mcg Oral Q0600   loratadine  10 mg Oral QHS   losartan  12.5 mg Oral Daily   molnupiravir EUA  4 capsule Oral BID   pantoprazole  40 mg Oral Daily   rosuvastatin  20 mg Oral Daily   tamsulosin  0.4 mg Oral Daily  Continuous Infusions:   Diet Order             Diet regular Room service appropriate? Yes; Fluid consistency: Thin  Diet effective now                  Intake/Output Summary (Last 24 hours) at 03/25/2022 0904 Last data filed at 03/25/2022 2585 Gross per 24 hour  Intake --  Output 1710 ml  Net -1710 ml    Net IO Since Admission: -2,764.26 mL [03/25/22 0904]  Wt Readings from Last 3 Encounters:  03/25/22 60.9 kg  03/20/22 60.8 kg  05/18/21 57.2 kg     Unresulted Labs (From admission, onward)    None  Data Reviewed: I have personally reviewed following labs and imaging studies CBC: Recent Labs  Lab 03/20/22 0435 03/22/22 2219 03/22/22 2242 03/23/22 0500  WBC 10.9* 6.8  --  5.5  NEUTROABS 8.9* 5.6  --  4.1  HGB 13.2 13.3 13.6 12.1*  HCT 36.8* 38.0* 40.0 34.5*  MCV 92.9 95.0  --  94.0  PLT 150 145*  --  790    Basic Metabolic Panel: Recent Labs  Lab 03/20/22 0435 03/22/22 2242 03/23/22 0500  NA 135 133* 135  K 4.5 4.3 3.8  CL 103 100 107  CO2 22  --  22  GLUCOSE 203* 282* 237*  BUN 36* 24* 25*  CREATININE 2.01* 1.90* 1.85*  CALCIUM 9.3  --  8.6*  MG  --   --  1.7  PHOS  --   --  2.6    GFR: Estimated Creatinine Clearance: 22.6 mL/min (A) (by C-G formula based on SCr of 1.85 mg/dL (H)). Liver Function Tests: Recent Labs  Lab 03/20/22 0435 03/23/22 0500  AST 19 19  ALT 17 17  ALKPHOS 89 60  BILITOT 0.5 0.4  PROT 6.7 6.2*  ALBUMIN 4.1 3.1*    No results for input(s): "LIPASE", "AMYLASE" in the last 168 hours. No results for input(s): "AMMONIA" in the last 168 hours. Coagulation Profile: Recent Labs  Lab 03/20/22 0435  INR 1.1    BNP (last 3 results) No results for input(s):  "PROBNP" in the last 8760 hours. HbA1C: Recent Labs    03/23/22 0500  HGBA1C 8.7*    CBG: Recent Labs  Lab 03/24/22 0811 03/24/22 1149 03/24/22 1732 03/24/22 2101 03/25/22 0834  GLUCAP 168* 222* 289* 233* 203*    Lipid Profile: No results for input(s): "CHOL", "HDL", "LDLCALC", "TRIG", "CHOLHDL", "LDLDIRECT" in the last 72 hours. Thyroid Function Tests: No results for input(s): "TSH", "T4TOTAL", "FREET4", "T3FREE", "THYROIDAB" in the last 72 hours. Sepsis Labs: Recent Labs  Lab 03/20/22 0435 03/23/22 0500  PROCALCITON  --  <0.10  LATICACIDVEN 1.0  --      Recent Results (from the past 240 hour(s))  Blood Culture (routine x 2)     Status: None (Preliminary result)   Collection Time: 03/20/22  4:27 AM   Specimen: BLOOD  Result Value Ref Range Status   Specimen Description   Final    BLOOD LEFT ANTECUBITAL Performed at [facility], [address] [place], [place] [address]    Special Requests   Final    BOTTLES DRAWN AEROBIC AND ANAEROBIC Blood Culture adequate volume Performed at [facility], [address] [place], [place] [address]    Culture   Final    NO GROWTH 4 DAYS Performed at [facility], [place] [address] [place], [place] [address]    Report Status PENDING  Incomplete  Blood Culture (routine x 2)     Status: None (Preliminary result)   Collection Time: 03/20/22  4:27 AM   Specimen: BLOOD  Result Value Ref Range Status   Specimen Description   Final    BLOOD BLOOD LEFT FOREARM Performed at [facility], [address], [place], [place] [address]    Special Requests   Final    BOTTLES DRAWN AEROBIC AND ANAEROBIC Blood Culture adequate volume Performed at [facility], [address] [place], [place] [address]    Culture   Final    NO GROWTH 4 DAYS Performed at [facility], [place] [address], [place], [place]  [address]    Report Status PENDING   Incomplete  SARS Coronavirus 2 by RT PCR (hospital order, performed in [facility] hospital lab) *cepheid single result test* Anterior Nasal Swab     Status: None   Collection Time: 03/20/22  4:35 AM   Specimen: Anterior Nasal Swab  Result Value Ref Range Status   SARS Coronavirus 2 by RT PCR NEGATIVE NEGATIVE Final    Comment: (NOTE) SARS-CoV-2 target nucleic acids are NOT DETECTED.  The SARS-CoV-2 RNA is generally detectable in upper and lower respiratory specimens during the acute phase of infection. The lowest concentration of SARS-CoV-2 viral copies this assay can detect is 250 copies / mL. A negative result does not preclude SARS-CoV-2 infection and should not be used as the sole basis for treatment or other patient management decisions.  A negative result may occur with improper specimen collection / handling, submission of specimen other than nasopharyngeal swab, presence of viral mutation(s) within the areas targeted by this assay, and inadequate number of viral copies (<250 copies / mL). A negative result must be combined with clinical observations, patient history, and epidemiological information.  Fact Sheet for Patients:   https://www.patel.info/  Fact Sheet for Healthcare Providers: https://hall.com/  This test is not yet approved or  cleared by the [place] FDA and has been authorized for detection and/or diagnosis of SARS-CoV-2 by FDA under an Emergency Use Authorization (EUA).  This EUA will remain in effect (meaning this test can be used) for the duration of the COVID-19 declaration under Section 564(b)(1) of the Act, 21 U.S.C. section 360bbb-3(b)(1), unless the authorization is terminated or revoked sooner.  Performed at [facility], [address] [place], [place] [address]   Urine Culture     Status: None   Collection Time: 03/20/22  5:43 AM   Specimen: In/Out Cath Urine  Result Value  Ref Range Status   Specimen Description   Final    IN/OUT CATH URINE Performed at [facility], [address] [place], [place] [address]    Special Requests   Final    NONE Performed at [facility], [address], [place], [place] [address]    Culture   Final    NO GROWTH Performed at [facility], [place] [address], [place], [place] [address]    Report Status 03/21/2022 FINAL  Final  SARS Coronavirus 2 by RT PCR (hospital order, performed in [facility] hospital lab) *cepheid single result test* Anterior Nasal Swab     Status: Abnormal   Collection Time: 03/22/22 10:19 PM   Specimen: Anterior Nasal Swab  Result Value Ref Range Status   SARS Coronavirus 2 by RT PCR POSITIVE (A) NEGATIVE Final    Comment: (NOTE) SARS-CoV-2 target nucleic acids are DETECTED  SARS-CoV-2 RNA is generally detectable in upper respiratory specimens  during the acute phase of infection.  Positive results are indicative  of the presence of the identified virus, but do not rule out bacterial infection or co-infection with other pathogens not detected by the test.  Clinical correlation with patient history and  other diagnostic information is necessary to determine patient infection status.  The expected result is negative.  Fact Sheet for Patients:   https://www.patel.info/   Fact Sheet for Healthcare Providers:   https://hall.com/    This test is not yet approved or cleared by the [place] FDA and  has been authorized for detection and/or diagnosis of SARS-CoV-2 by FDA under an Emergency Use Authorization (  EUA).  This EUA will remain in effect (meaning this test can be used) for the duration of  the COVID-19 declaration under Section 564(b)(1)  of the Act, 21 U.S.C. section 360-bbb-3(b)(1), unless the authorization is terminated or revoked sooner.   Performed at [facility], [place]  [address], [place], [place] [address]     Antimicrobials: Anti-infectives (From admission, onward)    Start     Dose/Rate Route Frequency Ordered Stop   03/23/22 0030  molnupiravir EUA (LAGEVRIO) capsule 800 mg        4 capsule Oral 2 times daily 03/23/22 0023 03/27/22 2159      Culture/Microbiology    Component Value Date/Time   SDES  03/20/2022 0543    IN/OUT CATH URINE Performed at [facility], [address], [place], [place] [address]    [facility]  03/20/2022 0543    NONE Performed at [facility], [address], [place], [place] [address]    CULT  03/20/2022 0543    NO GROWTH Performed at [facility] [address] [place], [place] [address]    REPTSTATUS 03/21/2022 FINAL 03/20/2022 0543    Other culture-see note  Radiology Studies: No results found.   LOS: 2 days   Antonieta Pert, MD [facility]  03/25/2022, 9:04 AM

## 2022-03-26 DIAGNOSIS — R531 Weakness: Secondary | ICD-10-CM | POA: Diagnosis not present

## 2022-03-26 LAB — BASIC METABOLIC PANEL
Anion gap: 8 (ref 5–15)
BUN: 37 mg/dL — ABNORMAL HIGH (ref 8–23)
CO2: 21 mmol/L — ABNORMAL LOW (ref 22–32)
Calcium: 8.9 mg/dL (ref 8.9–10.3)
Chloride: 108 mmol/L (ref 98–111)
Creatinine, Ser: 1.72 mg/dL — ABNORMAL HIGH (ref 0.61–1.24)
GFR, Estimated: 38 mL/min — ABNORMAL LOW (ref >60)
Glucose, Bld: 209 mg/dL — ABNORMAL HIGH (ref 70–99)
Potassium: 3.8 mmol/L (ref 3.5–5.1)
Sodium: 137 mmol/L (ref 135–145)

## 2022-03-26 LAB — CULTURE, BLOOD (ROUTINE X 2)
Culture: NO GROWTH
Culture: NO GROWTH
Special Requests: ADEQUATE
Special Requests: ADEQUATE

## 2022-03-26 LAB — CBC
HCT: 37.9 % — ABNORMAL LOW (ref 39.0–52.0)
Hemoglobin: 13.3 g/dL (ref 13.0–17.0)
MCH: 32.9 pg (ref 26.0–34.0)
MCHC: 35.1 g/dL (ref 30.0–36.0)
MCV: 93.8 fL (ref 80.0–100.0)
Platelets: 160 10*3/uL (ref 150–400)
RBC: 4.04 MIL/uL — ABNORMAL LOW (ref 4.22–5.81)
RDW: 12.4 % (ref 11.5–15.5)
WBC: 7.9 10*3/uL (ref 4.0–10.5)
nRBC: 0 % (ref 0.0–0.2)

## 2022-03-26 LAB — GLUCOSE, CAPILLARY
Glucose-Capillary: 172 mg/dL — ABNORMAL HIGH (ref 70–99)
Glucose-Capillary: 306 mg/dL — ABNORMAL HIGH (ref 70–99)

## 2022-03-26 MED ORDER — MOLNUPIRAVIR 200 MG PO CAPS: 4.0000 | ORAL_CAPSULE | Freq: Two times a day (BID) | ORAL | 0 refills | Status: AC

## 2022-03-26 NOTE — TOC Transition Note (Signed)
Transition of Care [facility]) - CM/SW Discharge Note   Patient Details  Name: [name] [name] MRN: [id number] Date of Birth: June 22, 1933  Transition [facility] Care [facility]) CM/SW Contact:  Vassie Moselle, LCSW Phone Number: 03/26/2022, 11:33 AM   Clinical Narrative:    TOC consulted for family interest in home resources/referrals. Per chart review pt able to care for ADL's and ambulate independently. CSW spoke with pt's daughter, who shares she does have concerns that pt will not function as well when he returns home however, understands he does not qualify for home health services at this time. CSW added home health agency information to pt's chart to be given at discharge and explained that these would be private pay. Pt's daughter states they currently do not have the income for private pay but, would keep these in mind. She and her husband plan to rearrange things in the home and set firm rules to assist with keeping pt safe at home.    Final next level of care: Home/Self Care Barriers to Discharge: No Barriers Identified   Patient Goals and CMS Choice Patient states their goals for this hospitalization and ongoing recovery are:: To return home CMS Medicare.gov Compare Post Acute Care list provided to:: Patient Choice offered to / list presented to : Patient, Adult Children  Discharge Placement                       Discharge Plan and Services                DME Arranged: N/A DME Agency: NA                  Social Determinants of Health (SDOH) Interventions     Readmission Risk Interventions    03/25/2022    2:52 PM  Readmission Risk Prevention Plan  Transportation Screening Complete  PCP or Specialist Appt within 3-5 Days Complete  HRI or [place] Complete  Social Work Consult for [place] Planning/Counseling Complete  Palliative Care Screening Not Applicable  Medication Review Press photographer) Complete

## 2022-03-26 NOTE — Discharge Summary (Signed)
Physician Discharge Summary  [name] [name] QQI:[id number] DOB: 18-Jun-1933 DOA: 03/22/2022  PCP: [name], MD  Admit date: 03/22/2022 Discharge date: 03/26/2022 Recommendations for Outpatient Follow-up:  Follow up with PCP in 1 weeks-call for appointment Please obtain BMP/CBC in one week  Discharge Dispo: home Discharge Condition: Stable Code Status:   Code Status: Full Code Diet recommendation:  Diet Order             Diet regular Room service appropriate? Yes; Fluid consistency: Thin  Diet effective now                    Brief/Interim Summary: 88 y.o. male with medical history significant of AAA, mild aortic insufficiency, BPH, celiac artery aneurysm, dementia, hypertension, hypothyroidism, type II DM, hiatal hernia, impaired hearing, hyperlipidemia, history of other nonhemorrhagic CVA presented with generalized weakness, decreased oxygen saturation to the low 90s, fever of 102 F at home, decreased oral intake, sore throat, rhinorrhea, nonproductive cough and new incontinence and  patient and his wife have tested positive for COVID-19. His symptoms started about 3 days PTA  ED course: Initial vital signs were temperature 98.8 F, pulse 84, respirations 14, BP 161/71 mmHg O2 sat 97% on room air.  Patient received 1000 milliliters of normal saline bolus and I added magnesium sulfate 2 g IVPB.  Lab work: Urinalysis with glucosuria more than 500 mg deciliter, but otherwise unremarkable.  Coronavirus PCR was positive.  CBC showed a white count of 6.8  platelets 145.CMP showed normal electrolytes after calcium correction.  Glucose 137, BUN 25, creatinine 1.85 mg/dL.Total protein 6.2 and albumin 3.1 g/dL. LFTS normal.  Magnesium was 1.7 and phosphorus 2.6 mg/dL Procalcitonin was normal. Imaging: Portable 1 view chest radiograph with no active disease.Patient was admitted for COVID-19 infection with symptomatic generalized weakness decreased oral intake. He was managed  conservatively with lagevrio.  At this time patient is clinically improved remains off oxygen respiratory status stable with mild cognitive impairment at baseline initially deconditioning but improved with PT OT work-up no further PT OT needed.  His wife also tested positive at home and doing well.  His labs with creatinine improved to 1.7 blood pressure well controlled. is medical history for discharge home, POC discussed with patient's daughter and is in agreement.   Discharge Diagnoses:  Principal Problem:   Generalized weakness Active Problems:   Hyperlipidemia   Essential hypertension   Type 2 diabetes mellitus with vascular disease (HCC)   Hypothyroidism   BPH (benign prostatic hyperplasia)   Dementia (HCC)   COVID-19 virus infection   Elevated serum creatinine   Normocytic anemia  COVID-19 infection Generalized weakness Poor oral intake and failure to thrive: Clinically improved.  Remains on room air. chest x-ray clear.  Continue molnupiravir to complete x 5 days treatment until 03/27/22.Procalcitonin is negative.  No more fever episodes since 9/23   CKD stage IIIb elevated creatinine 1.5 in 2021 now  in 1.9> 1.7 Per daughter who is a Designer, jewellery this is about his baseline followed by [name] kidney.  Monitor.   Essential hypertension/hyperlipidemia: BP is now controlled after resuming losartan low-dose.continue Crestor.   Dementia: mild.Mood stable alert awake BPH: Continue home meds Normocytic anemia: Overall stable hb. T2DM: A1c blood sugar as below.  Continue home meds upon discharge Recent Labs  Lab 03/23/22 0500 03/23/22 1908 03/25/22 0834 03/25/22 1249 03/25/22 1657 03/25/22 2155 03/26/22 0925  GLUCAP  --    < > 203* 214* 228* 248* 172*  HGBA1C  8.7*  --   --   --   --   --   --    < > = values in this interval not displayed.     Consults: none Subjective: Alert oriented at baseline, resting comfortably on room air.  Discharge  Exam: Vitals:   03/25/22 1955 03/26/22 0510  BP: (!) 144/77 (!) 144/68  Pulse: 86 81  Resp: 18 18  Temp: 99.4 F (37.4 C) 98.8 F (37.1 C)  SpO2: 93% 97%   General: Pt is alert, awake, not in acute distress Cardiovascular: RRR, S1/S2 +, no rubs, no gallops Respiratory: CTA bilaterally, no wheezing, no rhonchi Abdominal: Soft, NT, ND, bowel sounds + Extremities: no edema, no cyanosis  Discharge Instructions  Discharge Instructions     Discharge instructions   Complete by: As directed    Continue with isolation for 10 days since that day tested positive or onset of illness   Please call call MD or return to ER for similar or worsening recurring problem that brought you to hospital or if any fever,nausea/vomiting,abdominal pain, uncontrolled pain, chest pain,  shortness of breath or any other alarming symptoms.  Please follow-up your doctor as instructed in a week time and call the office for appointment.  Please avoid alcohol, smoking, or any other illicit substance and maintain healthy habits including taking your regular medications as prescribed.  You were cared for by a hospitalist during your hospital stay. If you have any questions about your discharge medications or the care you received while you were in the hospital after you are discharged, you can call the unit and ask to speak with the hospitalist on call if the hospitalist that took care of you is not available.  Once you are discharged, your primary care physician will handle any further medical issues. Please note that NO REFILLS for any discharge medications will be authorized once you are discharged, as it is imperative that you return to your primary care physician (or establish a relationship with a primary care physician if you do not have one) for your aftercare needs so that they can reassess your need for medications and monitor your lab values   Increase activity slowly   Complete by: As directed        Allergies as of 03/26/2022       Reactions   Actos [pioglitazone] Swelling   Edema   Allegra [fexofenadine] Other (See Comments)   stimulant   Iodine Swelling, Other (See Comments)   DEADLY SICK 01-05-2019 Pt stated he had IODINE Contrast 2 times without any problem.   Metformin    Metformin And Related Diarrhea   Pt states that he has a reaction to the extended release metformin but takes the regular   Onglyza [saxagliptin] Other (See Comments)   Weight loss   Shellfish Allergy    DEADLY SICK        Medication List     TAKE these medications    cholecalciferol 25 MCG (1000 UNIT) tablet Commonly known as: VITAMIN D3 Take 1,000 Units by mouth daily with supper.   clopidogrel 75 MG tablet Commonly known as: PLAVIX Take 1 tablet (75 mg total) by mouth daily.   CoQ10 100 MG Caps Take 100 mg by mouth daily.   cyanocobalamin 1000 MCG tablet Commonly known as: VITAMIN B12 Take 1,000 mcg by mouth daily.   diazepam 5 MG tablet Commonly known as: VALIUM Take 2.5-5 mg by mouth at bedtime as needed for anxiety (restless legs).  doxazosin 1 MG tablet Commonly known as: CARDURA Take 1 tablet (1 mg total) by mouth daily.   insulin glargine 100 UNIT/ML injection Commonly known as: LANTUS Inject 8 Units into the skin daily.   levothyroxine 75 MCG tablet Commonly known as: SYNTHROID Take 75 mcg by mouth daily before breakfast. 1 tablet on weekdays and 1.5 tablets on weekends   loratadine 10 MG tablet Commonly known as: CLARITIN Take 10 mg by mouth daily.   losartan 25 MG tablet Commonly known as: COZAAR Take 25 mg by mouth daily.   molnupiravir EUA 200 MG Caps capsule Commonly known as: LAGEVRIO Take 4 capsules (800 mg total) by mouth 2 (two) times daily for 1 day.   multivitamin with minerals Tabs tablet Take 1 tablet by mouth daily.   Omega-3 1000 MG Caps Take 1,000 mg by mouth daily.   pantoprazole 40 MG tablet Commonly known as: PROTONIX Take 1  tablet (40 mg total) by mouth 2 (two) times daily before a meal. What changed: when to take this   rosuvastatin 20 MG tablet Commonly known as: CRESTOR Take 20 mg by mouth daily.   saccharomyces boulardii 250 MG capsule Commonly known as: FLORASTOR Take 1 capsule (250 mg total) by mouth 2 (two) times daily.        Follow-up Information     [name], MD Follow up in 1 week(s).   Specialty: Family Medicine Contact information: [place] [address] [phone]         Sueanne Margarita, MD .   Specialty: Cardiology Contact information: [phone] [address] [place] [address] [phone]                Allergies  Allergen Reactions   Actos [Pioglitazone] Swelling    Edema   Allegra [Fexofenadine] Other (See Comments)    stimulant   Iodine Swelling and Other (See Comments)    DEADLY SICK 01-05-2019 Pt stated he had IODINE Contrast 2 times without any problem.    Metformin    Metformin And Related Diarrhea    Pt states that he has a reaction to the extended release metformin but takes the regular   Onglyza [Saxagliptin] Other (See Comments)    Weight loss   Shellfish Allergy     DEADLY SICK    The results of significant diagnostics from this hospitalization (including imaging, microbiology, ancillary and laboratory) are listed below for reference.    Microbiology: Recent Results (from the past 240 hour(s))  Blood Culture (routine x 2)     Status: None   Collection Time: 03/20/22  4:27 AM   Specimen: BLOOD  Result Value Ref Range Status   Specimen Description   Final    BLOOD LEFT ANTECUBITAL Performed at [facility], [address] [place], [place] [address]    Special Requests   Final    BOTTLES DRAWN AEROBIC AND ANAEROBIC Blood Culture adequate volume Performed at [facility], [address] [place], [place] [address]    Culture   Final    NO GROWTH  6 DAYS Performed at [facility], [place] [address], [place],  [address]    Report Status 03/26/2022 FINAL  Final  Blood Culture (routine x 2)     Status: None   Collection Time: 03/20/22  4:27 AM   Specimen: BLOOD  Result Value Ref Range Status   Specimen Description   Final    BLOOD BLOOD LEFT  FOREARM Performed at [facility], [address], [place], [place] [address]    Special Requests   Final    BOTTLES DRAWN AEROBIC AND ANAEROBIC Blood Culture adequate volume Performed at [facility], [address], [place], [place] [address]    Culture   Final    NO GROWTH 6 DAYS Performed at [facility], [place] [address] [place], [place] [address]    Report Status 03/26/2022 FINAL  Final  SARS Coronavirus 2 by RT PCR (hospital order, performed in [facility] hospital lab) *cepheid single result test* Anterior Nasal Swab     Status: None   Collection Time: 03/20/22  4:35 AM   Specimen: Anterior Nasal Swab  Result Value Ref Range Status   SARS Coronavirus 2 by RT PCR NEGATIVE NEGATIVE Final    Comment: (NOTE) SARS-CoV-2 target nucleic acids are NOT DETECTED.  The SARS-CoV-2 RNA is generally detectable in upper and lower respiratory specimens during the acute phase of infection. The lowest concentration of SARS-CoV-2 viral copies this assay can detect is 250 copies / mL. A negative result does not preclude SARS-CoV-2 infection and should not be used as the sole basis for treatment or other patient management decisions.  A negative result may occur with improper specimen collection / handling, submission of specimen other than nasopharyngeal swab, presence of viral mutation(s) within the areas targeted by this assay, and inadequate number of viral copies (<250 copies / mL). A negative result must be combined with clinical observations, patient history, and epidemiological information.  Fact Sheet for Patients:    https://www.patel.info/  Fact Sheet for Healthcare Providers: https://hall.com/  This test is not yet approved or  cleared by the [place] FDA and has been authorized for detection and/or diagnosis of SARS-CoV-2 by FDA under an Emergency Use Authorization (EUA).  This EUA will remain in effect (meaning this test can be used) for the duration of the COVID-19 declaration under Section 564(b)(1) of the Act, 21 U.S.C. section 360bbb-3(b)(1), unless the authorization is terminated or revoked sooner.  Performed at [facility], [address] [place], [place] [address]   Urine Culture     Status: None   Collection Time: 03/20/22  5:43 AM   Specimen: In/Out Cath Urine  Result Value Ref Range Status   Specimen Description   Final    IN/OUT CATH URINE Performed at [facility], [address] [place], [place] [address]    Special Requests   Final    NONE Performed at [facility], [address], [place], [place] [address]    Culture   Final    NO GROWTH Performed at [facility], [place] [address] [place], [place] [address]    Report Status 03/21/2022 FINAL  Final  SARS Coronavirus 2 by RT PCR (hospital order, performed in [facility] hospital lab) *cepheid single result test* Anterior Nasal Swab     Status: Abnormal   Collection Time: 03/22/22 10:19 PM   Specimen: Anterior Nasal Swab  Result Value Ref Range Status   SARS Coronavirus 2 by RT PCR POSITIVE (A) NEGATIVE Final    Comment: (NOTE) SARS-CoV-2 target nucleic acids are DETECTED  SARS-CoV-2 RNA is generally detectable in upper respiratory specimens  during the acute phase of infection.  Positive results are indicative  of the presence of the identified virus, but do not rule out bacterial infection or co-infection with other pathogens not detected by the test.  Clinical correlation with patient  history and   other diagnostic information is necessary to determine patient infection status.  The expected result is negative.  Fact Sheet for Patients:   https://www.patel.info/   Fact Sheet for Healthcare Providers:   https://hall.com/    This test is not yet approved or cleared by the [place] FDA and  has been authorized for detection and/or diagnosis of SARS-CoV-2 by FDA under an Emergency Use Authorization (EUA).  This EUA will remain in effect (meaning this test can be used) for the duration of  the COVID-19 declaration under Section 564(b)(1)  of the Act, 21 U.S.C. section 360-bbb-3(b)(1), unless the authorization is terminated or revoked sooner.   Performed at [facility], [place] [address], [place], [place] [address]     Procedures/Studies: DG Chest Port 1 View  Result Date: 03/22/2022 CLINICAL DATA:  Shortness of breath EXAM: PORTABLE CHEST 1 VIEW COMPARISON:  Chest x-ray 12/30/2017 FINDINGS: Loop recorder device overlies the left chest, new from prior. The heart size and mediastinal contours are within normal limits. Both lungs are clear. The visualized skeletal structures are unremarkable. IMPRESSION: No active disease. Electronically Signed   By: Ronney Asters M.D.   On: 03/22/2022 22:37   DG Chest 2 View  Result Date: 03/20/2022 CLINICAL DATA:  Inconclusive single view. Fever EXAM: CHEST - 2 VIEW COMPARISON:  Earlier today FINDINGS: Improved inflation. There is no edema, consolidation, effusion, or pneumothorax. Normal heart size and mediastinal contours. Implantable loop recorder. Cholecystectomy clips. Artifact from EKG leads. IMPRESSION: Clear lungs.  No evidence of active disease. Electronically Signed   By: Jorje Guild M.D.   On: 03/20/2022 05:40   DG Chest Port 1 View  Result Date: 03/20/2022 CLINICAL DATA:  Questionable sepsis. Fever and chills. General weakness. EXAM: PORTABLE CHEST 1 VIEW COMPARISON:   PA Lat 12/30/2017 FINDINGS: The lungs are expiratory. There is increased opacity in both infrahilar areas which could be atelectasis or small infiltrates. The remainder of the hypoinflated lungs are generally clear. No pleural effusion is seen. Heart size and vasculature are normal with normal mediastinal configuration. There is a left chest loop recorder device. Osteopenia and thoracic spondylosis. IMPRESSION: Expiratory study with bilateral infrahilar opacities which could be atelectasis or pneumonia. Follow-up study recommended with full inspiration. Electronically Signed   By: Telford Nab M.D.   On: 03/20/2022 04:55    Labs: BNP (last 3 results) No results for input(s): "BNP" in the last 8760 hours. Basic Metabolic Panel: Recent Labs  Lab 03/20/22 0435 03/22/22 2242 03/23/22 0500 03/25/22 1046 03/26/22 0543  NA 135 133* 135 135 137  K 4.5 4.3 3.8 3.8 3.8  CL 103 100 107 105 108  CO2 22  --  22 22 21*  GLUCOSE 203* 282* 237* 276* 209*  BUN 36* 24* 25* 34* 37*  CREATININE 2.01* 1.90* 1.85* 2.04* 1.72*  CALCIUM 9.3  --  8.6* 8.8* 8.9  MG  --   --  1.7  --   --   PHOS  --   --  2.6  --   --    Liver Function Tests: Recent Labs  Lab 03/20/22 0435 03/23/22 0500  AST 19 19  ALT 17 17  ALKPHOS 89 60  BILITOT 0.5 0.4  PROT 6.7 6.2*  ALBUMIN 4.1 3.1*   No results for input(s): "LIPASE", "AMYLASE" in the last 168 hours. No results for input(s): "AMMONIA" in the last 168 hours. CBC: Recent Labs  Lab 03/20/22 0435 03/22/22 2219 03/22/22 2242 03/23/22  0500 03/26/22 0543  WBC 10.9* 6.8  --  5.5 7.9  NEUTROABS 8.9* 5.6  --  4.1  --   HGB 13.2 13.3 13.6 12.1* 13.3  HCT 36.8* 38.0* 40.0 34.5* 37.9*  MCV 92.9 95.0  --  94.0 93.8  PLT 150 145*  --  152 160   Cardiac Enzymes: No results for input(s): "CKTOTAL", "CKMB", "CKMBINDEX", "TROPONINI" in the last 168 hours. BNP: Invalid input(s): "POCBNP" CBG: Recent Labs  Lab 03/25/22 0834 03/25/22 1249 03/25/22 1657  03/25/22 2155 03/26/22 0925  GLUCAP 203* 214* 228* 248* 172*   D-Dimer No results for input(s): "DDIMER" in the last 72 hours. Hgb A1c No results for input(s): "HGBA1C" in the last 72 hours. Lipid Profile No results for input(s): "CHOL", "HDL", "LDLCALC", "TRIG", "CHOLHDL", "LDLDIRECT" in the last 72 hours. Thyroid function studies No results for input(s): "TSH", "T4TOTAL", "T3FREE", "THYROIDAB" in the last 72 hours.  Invalid input(s): "FREET3" Anemia work up No results for input(s): "VITAMINB12", "FOLATE", "FERRITIN", "TIBC", "IRON", "RETICCTPCT" in the last 72 hours. Urinalysis    Component Value Date/Time   COLORURINE STRAW (A) 03/22/2022 2302   APPEARANCEUR CLEAR 03/22/2022 2302   LABSPEC 1.010 03/22/2022 2302   PHURINE 6.0 03/22/2022 2302   GLUCOSEU >=500 (A) 03/22/2022 2302   HGBUR NEGATIVE 03/22/2022 2302   BILIRUBINUR NEGATIVE 03/22/2022 2302   KETONESUR NEGATIVE 03/22/2022 2302   PROTEINUR NEGATIVE 03/22/2022 2302   NITRITE NEGATIVE 03/22/2022 2302   LEUKOCYTESUR NEGATIVE 03/22/2022 2302   Sepsis Labs Recent Labs  Lab 03/20/22 0435 03/22/22 2219 03/23/22 0500 03/26/22 0543  WBC 10.9* 6.8 5.5 7.9   Microbiology Recent Results (from the past 240 hour(s))  Blood Culture (routine x 2)     Status: None   Collection Time: 03/20/22  4:27 AM   Specimen: BLOOD  Result Value Ref Range Status   Specimen Description   Final    BLOOD LEFT ANTECUBITAL Performed at [facility], [address] [place], [place] [address]    Special Requests   Final    BOTTLES DRAWN AEROBIC AND ANAEROBIC Blood Culture adequate volume Performed at [facility], [address], [place], [place] [address]    Culture   Final    NO GROWTH 6 DAYS Performed at [facility], [place] [address], [place], [place] [address]    Report Status 03/26/2022 FINAL  Final  Blood Culture (routine x 2)     Status: None   Collection Time: 03/20/22  4:27 AM    Specimen: BLOOD  Result Value Ref Range Status   Specimen Description   Final    BLOOD BLOOD LEFT FOREARM Performed at [facility], [address], [place], [place] [address]    Special Requests   Final    BOTTLES DRAWN AEROBIC AND ANAEROBIC Blood Culture adequate volume Performed at [facility], [address], [place], [place] [address]    Culture   Final    NO GROWTH 6 DAYS Performed at [facility], [place] [address], [place], [place] [address]    Report Status 03/26/2022 FINAL  Final  SARS Coronavirus 2 by RT PCR (hospital order, performed in [facility] hospital lab) *cepheid single result test* Anterior Nasal Swab     Status: None   Collection Time: 03/20/22  4:35 AM   Specimen: Anterior Nasal Swab  Result Value Ref Range Status   SARS Coronavirus 2 by RT PCR NEGATIVE NEGATIVE Final    Comment: (NOTE) SARS-CoV-2 target  nucleic acids are NOT DETECTED.  The SARS-CoV-2 RNA is generally detectable in upper and lower respiratory specimens during the acute phase of infection. The lowest concentration of SARS-CoV-2 viral copies this assay can detect is 250 copies / mL. A negative result does not preclude SARS-CoV-2 infection and should not be used as the sole basis for treatment or other patient management decisions.  A negative result may occur with improper specimen collection / handling, submission of specimen other than nasopharyngeal swab, presence of viral mutation(s) within the areas targeted by this assay, and inadequate number of viral copies (<250 copies / mL). A negative result must be combined with clinical observations, patient history, and epidemiological information.  Fact Sheet for Patients:   https://www.patel.info/  Fact Sheet for Healthcare Providers: https://hall.com/  This test is not yet approved or  cleared by the [place] FDA and has been authorized for  detection and/or diagnosis of SARS-CoV-2 by FDA under an Emergency Use Authorization (EUA).  This EUA will remain in effect (meaning this test can be used) for the duration of the COVID-19 declaration under Section 564(b)(1) of the Act, 21 U.S.C. section 360bbb-3(b)(1), unless the authorization is terminated or revoked sooner.  Performed at [facility], [address] [place], [place] [address]   Urine Culture     Status: None   Collection Time: 03/20/22  5:43 AM   Specimen: In/Out Cath Urine  Result Value Ref Range Status   Specimen Description   Final    IN/OUT CATH URINE Performed at [facility], [address], [place], [place] [address]    Special Requests   Final    NONE Performed at [facility], [address] [place], [place] [address]    Culture   Final    NO GROWTH Performed at [facility], [place] [address] [place], [place] [address]    Report Status 03/21/2022 FINAL  Final  SARS Coronavirus 2 by RT PCR (hospital order, performed in [facility] hospital lab) *cepheid single result test* Anterior Nasal Swab     Status: Abnormal   Collection Time: 03/22/22 10:19 PM   Specimen: Anterior Nasal Swab  Result Value Ref Range Status   SARS Coronavirus 2 by RT PCR POSITIVE (A) NEGATIVE Final    Comment: (NOTE) SARS-CoV-2 target nucleic acids are DETECTED  SARS-CoV-2 RNA is generally detectable in upper respiratory specimens  during the acute phase of infection.  Positive results are indicative  of the presence of the identified virus, but do not rule out bacterial infection or co-infection with other pathogens not detected by the test.  Clinical correlation with patient history and  other diagnostic information is necessary to determine patient infection status.  The expected result is negative.  Fact Sheet for Patients:   https://www.patel.info/   Fact Sheet for Healthcare  Providers:   https://hall.com/    This test is not yet approved or cleared by the [place] FDA and  has been authorized for detection and/or diagnosis of SARS-CoV-2 by FDA under an Emergency Use Authorization (EUA).  This EUA will remain in effect (meaning this test can be used) for the duration of  the COVID-19 declaration under Section 564(b)(1)  of the Act, 21 U.S.C. section 360-bbb-3(b)(1), unless the authorization is terminated or revoked sooner.   Performed at [facility], [place] [address], [place], [place] [address]      Time coordinating discharge: 25 minutes  SIGNED: Antonieta Pert, MD  [facility] 03/26/2022,  10:26 AM  If 7PM-7AM, please contact night-coverage [email or web address]

## 2022-03-27 ENCOUNTER — Emergency Department (HOSPITAL_COMMUNITY): Payer: PPO

## 2022-03-27 ENCOUNTER — Emergency Department (HOSPITAL_COMMUNITY)
Admission: EM | Admit: 2022-03-27 | Discharge: 2022-03-27 | Disposition: A | Discharge status: Home / Self Care | Payer: PPO | Attending: Emergency Medicine | Admitting: Emergency Medicine

## 2022-03-27 ENCOUNTER — Other Ambulatory Visit: Payer: Self-pay

## 2022-03-27 ENCOUNTER — Encounter (HOSPITAL_COMMUNITY): Payer: Self-pay

## 2022-03-27 DIAGNOSIS — Z794 Long term (current) use of insulin: Secondary | ICD-10-CM | POA: Insufficient documentation

## 2022-03-27 DIAGNOSIS — Z8616 Personal history of COVID-19: Secondary | ICD-10-CM | POA: Diagnosis not present

## 2022-03-27 DIAGNOSIS — I10 Essential (primary) hypertension: Secondary | ICD-10-CM | POA: Diagnosis not present

## 2022-03-27 DIAGNOSIS — F0390 Unspecified dementia without behavioral disturbance: Secondary | ICD-10-CM | POA: Diagnosis not present

## 2022-03-27 DIAGNOSIS — R82998 Other abnormal findings in urine: Secondary | ICD-10-CM | POA: Diagnosis not present

## 2022-03-27 DIAGNOSIS — E119 Type 2 diabetes mellitus without complications: Secondary | ICD-10-CM | POA: Insufficient documentation

## 2022-03-27 DIAGNOSIS — R079 Chest pain, unspecified: Secondary | ICD-10-CM | POA: Diagnosis not present

## 2022-03-27 DIAGNOSIS — E039 Hypothyroidism, unspecified: Secondary | ICD-10-CM | POA: Diagnosis not present

## 2022-03-27 DIAGNOSIS — Z79899 Other long term (current) drug therapy: Secondary | ICD-10-CM | POA: Insufficient documentation

## 2022-03-27 DIAGNOSIS — Z7902 Long term (current) use of antithrombotics/antiplatelets: Secondary | ICD-10-CM | POA: Insufficient documentation

## 2022-03-27 DIAGNOSIS — R739 Hyperglycemia, unspecified: Secondary | ICD-10-CM | POA: Diagnosis not present

## 2022-03-27 DIAGNOSIS — R55 Syncope and collapse: Secondary | ICD-10-CM | POA: Insufficient documentation

## 2022-03-27 LAB — BASIC METABOLIC PANEL
Anion gap: 8 (ref 5–15)
BUN: 40 mg/dL — ABNORMAL HIGH (ref 8–23)
CO2: 19 mmol/L — ABNORMAL LOW (ref 22–32)
Calcium: 8.4 mg/dL — ABNORMAL LOW (ref 8.9–10.3)
Chloride: 108 mmol/L (ref 98–111)
Creatinine, Ser: 1.96 mg/dL — ABNORMAL HIGH (ref 0.61–1.24)
GFR, Estimated: 32 mL/min — ABNORMAL LOW (ref >60)
Glucose, Bld: 222 mg/dL — ABNORMAL HIGH (ref 70–99)
Potassium: 4.6 mmol/L (ref 3.5–5.1)
Sodium: 135 mmol/L (ref 135–145)

## 2022-03-27 LAB — CBC WITH DIFFERENTIAL/PLATELET
Abs Immature Granulocytes: 0.06 10*3/uL (ref 0.00–0.07)
Basophils Absolute: 0 10*3/uL (ref 0.0–0.1)
Basophils Relative: 0 %
Eosinophils Absolute: 0.1 10*3/uL (ref 0.0–0.5)
Eosinophils Relative: 0 %
HCT: 36.5 % — ABNORMAL LOW (ref 39.0–52.0)
Hemoglobin: 12.9 g/dL — ABNORMAL LOW (ref 13.0–17.0)
Immature Granulocytes: 1 %
Lymphocytes Relative: 8 %
Lymphs Abs: 0.9 10*3/uL (ref 0.7–4.0)
MCH: 33.2 pg (ref 26.0–34.0)
MCHC: 35.3 g/dL (ref 30.0–36.0)
MCV: 94.1 fL (ref 80.0–100.0)
Monocytes Absolute: 1 10*3/uL (ref 0.1–1.0)
Monocytes Relative: 8 %
Neutro Abs: 9.5 10*3/uL — ABNORMAL HIGH (ref 1.7–7.7)
Neutrophils Relative %: 83 %
Platelets: 172 10*3/uL (ref 150–400)
RBC: 3.88 MIL/uL — ABNORMAL LOW (ref 4.22–5.81)
RDW: 12.4 % (ref 11.5–15.5)
WBC: 11.5 10*3/uL — ABNORMAL HIGH (ref 4.0–10.5)
nRBC: 0 % (ref 0.0–0.2)

## 2022-03-27 LAB — URINALYSIS, ROUTINE W REFLEX MICROSCOPIC
Bilirubin Urine: NEGATIVE
Glucose, UA: 50 mg/dL — AB
Ketones, ur: 5 mg/dL — AB
Nitrite: NEGATIVE
Protein, ur: 30 mg/dL — AB
Specific Gravity, Urine: 1.014 (ref 1.005–1.030)
pH: 5 (ref 5.0–8.0)

## 2022-03-27 MED ORDER — CEFDINIR 300 MG PO CAPS: 300.0000 mg | ORAL_CAPSULE | Freq: Every day | ORAL | 0 refills | Status: AC

## 2022-03-27 NOTE — ED Provider Notes (Signed)
[facility] DEPT Provider Note  CSN: [id number] Arrival date & time: 03/27/22 7412  Chief Complaint(s) Near Syncope  HPI [name] [name] is a 88 y.o. male with history of mild dementia, diabetes, hyperlipidemia, hypertension, aortic abdominal aneurysm status post repair, aortic insufficiency presenting to the emergency department with near syncope.  Patient reports he was defecating when he developed lightheadedness, no syncope.  The symptoms resolved after few months.  He also reports some tunnel vision but no blurry vision or loss of vision.  Denies nausea or vomiting.  Denies headaches.  Denies chest pain, abdominal pain, blood in his stool, fevers or chills.  He was recently hospitalized for COVID, just discharged yesterday.  Currently feels at baseline, other than reporting mild residual weakness after his COVID infection.   Past Medical History Past Medical History:  Diagnosis Date   AAA (abdominal aortic aneurysm) ([place])    Aortic insufficiency    Mild to moderate by 2D echo 05/2021   Celiac artery aneurysm (HCC)    Dementia ([place]) 11/30/2017   Diabetes mellitus without complication ([place])    Essential hypertension    Hiatal hernia    HOH (hard of hearing)    Hyperlipidemia    Stroke [facility])    Patient Active Problem List   Diagnosis Date Noted   Generalized weakness 03/23/2022   COVID-19 virus infection 03/23/2022   Elevated serum creatinine 03/23/2022   Normocytic anemia 03/23/2022   Aortic insufficiency 05/21/2021   Foot callus 01/22/2021   Impacted cerumen of right ear 11/30/2020   Dysphagia 05/13/2018   Aspiration pneumonia due to food (regurgitated) ([place]) 11/30/2017   BPH (benign prostatic hyperplasia) 11/30/2017   Dementia ([place]) 11/30/2017   Type 2 diabetes mellitus with vascular disease ([place]) 11/27/2017   Hypothyroidism 11/27/2017   Cerebrovascular accident (CVA) ([place]) 09/09/2017   Word finding difficulty    Resting tremor  02/04/2017   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   Internal carotid artery dissection ([place]) 02/04/2017   Cerebral thrombosis with cerebral infarction 01/17/2017   Aphasia    Hyperlipidemia    Essential hypertension    Expressive aphasia 01/16/2017   Diabetes mellitus ([place]) 01/16/2017   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 Authorizing Provider  cefdinir (OMNICEF) 300 MG capsule Take 1 capsule (300 mg total) by mouth daily for 7 days. 03/27/22 04/03/22 Yes Cristie Hem, MD  cholecalciferol (VITAMIN D) 1000 units tablet Take 1,000 Units by mouth daily with supper.    [provider]  clopidogrel (PLAVIX) 75 MG tablet Take 1 tablet (75 mg total) by mouth daily. 09/11/17   Rama, Venetia Maxon, MD  Coenzyme Q10 (COQ10) 100 MG CAPS Take 100 mg by mouth daily.    [provider]  diazepam (VALIUM) 5 MG tablet Take 2.5-5 mg by mouth at bedtime as needed for anxiety (restless legs).    [provider]  doxazosin (CARDURA) 1 MG tablet Take 1 tablet (1 mg total) by mouth daily. Patient not taking: Reported on 04/24/2021 12/01/17   Debbe Odea, MD  insulin glargine (LANTUS) 100 UNIT/ML injection Inject 8 Units into the skin daily.    [provider]  levothyroxine ([place], [place]) 75  MCG tablet Take 75 mcg by mouth daily before breakfast. 1 tablet on weekdays and 1.5 tablets on weekends 09/15/13   [provider]  loratadine (CLARITIN) 10 MG tablet Take 10 mg by mouth daily.    [provider]  losartan (COZAAR) 25 MG tablet Take 25 mg by mouth daily. Patient not taking: Reported on  04/24/2021    [provider]  molnupiravir EUA (LAGEVRIO) 200 MG CAPS capsule Take 4 capsules (800 mg total) by mouth 2 (two) times daily for 1 day. 03/26/22 03/27/22  Antonieta Pert, MD  Multiple Vitamin (MULTIVITAMIN WITH MINERALS) TABS tablet Take 1 tablet by mouth daily.    [provider]  Omega-3 1000 MG CAPS Take 1,000 mg by mouth daily.    [provider]  pantoprazole (PROTONIX) 40 MG tablet Take 1 tablet (40 mg total) by mouth 2 (two) times daily before a meal. Patient taking differently: Take 40 mg by mouth daily. 11/30/17   Debbe Odea, MD  rosuvastatin (CRESTOR) 20 MG tablet Take 20 mg by mouth daily.    [provider]  saccharomyces boulardii (FLORASTOR) 250 MG capsule Take 1 capsule (250 mg total) by mouth 2 (two) times daily. Patient not taking: Reported on 04/24/2021 11/30/17   Debbe Odea, MD  vitamin B-12 (CYANOCOBALAMIN) 1000 MCG tablet Take 1,000 mcg by mouth daily.    [provider]                                                                                                                                    Past Surgical History Past Surgical History:  Procedure Laterality Date   ABDOMINAL AORTIC ANEURYSM REPAIR  10-2003   also had iliac aneurysm repair   CHOLECYSTECTOMY     Laparoscopic cholecystectomy   EYE SURGERY Bilateral    cataracts   HERNIA REPAIR     LOOP RECORDER INSERTION N/A 09/10/2017   Procedure: LOOP RECORDER INSERTION;  Surgeon: Deboraha Sprang, MD;  Location: [facility];  Service: Cardiovascular;  Laterality: N/A;   TEE WITHOUT CARDIOVERSION N/A 09/10/2017   Procedure: TRANSESOPHAGEAL ECHOCARDIOGRAM (TEE);  Surgeon: Jerline Pain, MD;  Location: [facility];  Service: Cardiovascular;  Laterality: N/A;   Family History Family History  Problem Relation Age of Onset   Arthritis Mother        Rheumatoid arthritis    Social History Social History   Tobacco Use   Smoking status: Never    Smokeless tobacco: Never  Vaping Use   Vaping Use: Never used  Substance Use Topics   Alcohol use: No   Drug use: No   Allergies Actos [pioglitazone], Allegra [fexofenadine], Iodine, Metformin, Metformin and related, Onglyza [saxagliptin], and Shellfish allergy  Review of Systems Review of Systems  All other systems reviewed and are negative.   Physical Exam Vital Signs  I have reviewed the triage vital signs BP 105/80 (BP Location: Left Arm)  Pulse 81   Temp 98.3 F (36.8 C)   Resp 17   Ht '5\' 3"'$  (1.6 m)   Wt 59.3 kg   SpO2 98%   BMI 23.16 kg/m  Physical Exam Vitals and nursing note reviewed.  Constitutional:      General: He is not in acute distress.    Appearance: Normal appearance.  HENT:     Mouth/Throat:     Mouth: Mucous membranes are moist.  Eyes:     Conjunctiva/sclera: Conjunctivae normal.  Cardiovascular:     Rate and Rhythm: Normal rate and regular rhythm.  Pulmonary:     Effort: Pulmonary effort is normal. No respiratory distress.     Breath sounds: Normal breath sounds.  Abdominal:     General: Abdomen is flat.     Palpations: Abdomen is soft.     Tenderness: There is no abdominal tenderness.  Musculoskeletal:     Right lower leg: No edema.     Left lower leg: No edema.  Skin:    General: Skin is warm and dry.     Capillary Refill: Capillary refill takes less than 2 seconds.  Neurological:     Mental Status: He is alert and oriented to person, place, and time. Mental status is at baseline.  Psychiatric:        Mood and Affect: Mood normal.        Behavior: Behavior normal.     ED Results and Treatments Labs (all labs ordered are listed, but only abnormal results are displayed) Labs Reviewed  BASIC METABOLIC PANEL - Abnormal; Notable for the following components:      Result Value   CO2 19 (*)    Glucose, Bld 222 (*)    BUN 40 (*)    Creatinine, Ser 1.96 (*)    Calcium 8.4 (*)    GFR, Estimated 32 (*)    All other components within  normal limits  CBC WITH DIFFERENTIAL/PLATELET - Abnormal; Notable for the following components:   WBC 11.5 (*)    RBC 3.88 (*)    Hemoglobin 12.9 (*)    HCT 36.5 (*)    Neutro Abs 9.5 (*)    All other components within normal limits  URINALYSIS, ROUTINE W REFLEX MICROSCOPIC - Abnormal; Notable for the following components:   Glucose, UA 50 (*)    Hgb urine dipstick SMALL (*)    Ketones, ur 5 (*)    Protein, ur 30 (*)    Leukocytes,Ua TRACE (*)    Bacteria, UA MANY (*)    All other components within normal limits  URINE CULTURE                                                                                                                          Radiology DG Chest Port 1 View  Result Date: 03/27/2022 CLINICAL DATA:  Chest pain EXAM: PORTABLE CHEST 1 VIEW COMPARISON:  03/22/2022 FINDINGS: The cardiomediastinal silhouette is within limits. There is  no focal airspace consolidation. There is no pleural effusion. No pneumothorax. There is no acute osseous abnormality. Loop recorder overlies the left mid chest. IMPRESSION: No evidence of acute cardiopulmonary disease. Electronically Signed   By: Maurine Simmering M.D.   On: 03/27/2022 11:16    Pertinent labs & imaging results that were available during my care of the patient were reviewed by me and considered in my medical decision making (see MDM for details).  Medications Ordered in ED Medications - No data to display                                                                                                                                   Procedures Procedures  (including critical care time)  Medical Decision Making / ED Course   MDM:  88 year old male presenting to the emergency department with near syncope.  Patient overall well-appearing, exam unremarkable.  Vitals reassuring.  Suspect most likely causes defecation or micturition syncope.  Doubt cardiac syncope, patient denies chest pain, palpitations, had no loss of  consciousness.  EKG reassuring, shows stable right bundle branch block.  Doubt neurologic cause of syncope, no reported seizure activity, patient never lost consciousness, no headaches.  Given resolution of symptoms, will check basic labs, ambulation trial, if reassuring, likely discharge with outpatient follow-up given history is most consistent with defecation or micturition syncope.  Clinical Course as of 03/27/22 1333  Wed Mar 27, 2022  1332 Patient was able to ambulate without difficulty.  Discussed with patient's daughter.  Urinalysis shows potential urine infection and patient does report some dysuria, so will prescribe antibiotic.  Patient feeling better.  Vitals remain stable. Will discharge patient to home. All questions answered. Patient comfortable with plan of discharge. Return precautions discussed with patient and specified on the after visit summary.  [WS]    Clinical Course User Index [WS] Cristie Hem, MD     Additional history obtained: -Additional history obtained from ems -External records from outside source obtained and reviewed including: Chart review including previous notes, labs, imaging, consultation notes   Lab Tests: -I ordered, reviewed, and interpreted labs.   The pertinent results include:   Labs Reviewed  BASIC METABOLIC PANEL - Abnormal; Notable for the following components:      Result Value   CO2 19 (*)    Glucose, Bld 222 (*)    BUN 40 (*)    Creatinine, Ser 1.96 (*)    Calcium 8.4 (*)    GFR, Estimated 32 (*)    All other components within normal limits  CBC WITH DIFFERENTIAL/PLATELET - Abnormal; Notable for the following components:   WBC 11.5 (*)    RBC 3.88 (*)    Hemoglobin 12.9 (*)    HCT 36.5 (*)    Neutro Abs 9.5 (*)    All other components within normal limits  URINALYSIS, ROUTINE W REFLEX MICROSCOPIC - Abnormal; Notable for the following components:  Glucose, UA 50 (*)    Hgb urine dipstick SMALL (*)    Ketones, ur 5 (*)     Protein, ur 30 (*)    Leukocytes,Ua TRACE (*)    Bacteria, UA MANY (*)    All other components within normal limits  URINE CULTURE      EKG   EKG Interpretation  Date/Time:  Wednesday March 27 2022 10:09:25 EDT Ventricular Rate:  81 PR Interval:  147 QRS Duration: 161 QT Interval:  395 QTC Calculation: 459 R Axis:   252 Text Interpretation: Sinus rhythm RBBB and LAFB Confirmed by Garnette Gunner [phone]) on 03/27/2022 11:14:07 AM         Imaging Studies ordered: I ordered imaging studies including CXR  On my interpretation imaging demonstrates clear lungs I independently visualized and interpreted imaging. I agree with the radiologist interpretation   Medicines ordered and prescription drug management: Meds ordered this encounter  Medications   cefdinir (OMNICEF) 300 MG capsule    Sig: Take 1 capsule (300 mg total) by mouth daily for 7 days.    Dispense:  7 capsule    Refill:  0    -I have reviewed the patients home medicines and have made adjustments as needed    Cardiac Monitoring: The patient was maintained on a cardiac monitor.  I personally viewed and interpreted the cardiac monitored which showed an underlying rhythm of: NSR    Reevaluation: After the interventions noted above, I reevaluated the patient and found that they have resolved  Co morbidities that complicate the patient evaluation  Past Medical History:  Diagnosis Date   AAA (abdominal aortic aneurysm) ([place])    Aortic insufficiency    Mild to moderate by 2D echo 05/2021   Celiac artery aneurysm ([place])    Dementia ([place]) 11/30/2017   Diabetes mellitus without complication ([place])    Essential hypertension    Hiatal hernia    HOH (hard of hearing)    Hyperlipidemia    Stroke [facility])       Dispostion: Discharge    Final Clinical Impression(s) / ED Diagnoses Final diagnoses:  Near syncope  Urine leukocytes increased     This chart was dictated using voice recognition  software.  Despite best efforts to proofread,  errors can occur which can change the documentation meaning.    Cristie Hem, MD 03/27/22 [phone]

## 2022-03-27 NOTE — ED Triage Notes (Signed)
Pt bib ems from home pt states he felt "dizzy, like I almost past out" while taking a bowel movement.   130/66 80 HR 258 cbg

## 2022-03-27 NOTE — Discharge Instructions (Signed)
We evaluated you for your near fainting spell.  Your lab tests were reassuring.  Your chest x-ray did not show pneumonia.  Your EKG looked similar to your old EKGs without signs of a heart attack.  Sometimes you can get a near fainting spell from peeing or defecating.  Can also get it from standing up quickly.  It is possible that you had it from one these causes.  We do not think it was caused by your heart.  You had some bacteria and white blood cells in your urine, this may represent a urine infection, we have prescribed you antibiotics to take twice a day for 7 days.  Please discontinue this if your urine culture returns negative.  Please follow-up closely with your primary doctor.  If you have any recurrent symptoms, fainting, chest pain, difficulty breathing, fevers or chills, vomiting, or any other concerning symptoms, please return to the emergency department.

## 2022-03-27 NOTE — ED Notes (Signed)
Pt able to ambulate in hallway and back to bed

## 2022-03-29 ENCOUNTER — Telehealth: Payer: Self-pay

## 2022-03-29 LAB — URINE CULTURE: Culture: 60000 — AB

## 2022-03-29 NOTE — Telephone Encounter (Signed)
        Patient  visited [facility] on 9/20    Telephone encounter attempt :  1st  A HIPAA compliant voice message was left requesting a return call.  Instructed patient to call back    [facility]  [phone], [place], [place] [address] Phone: [phone] Email: [email or web address]

## 2022-03-30 ENCOUNTER — Telehealth (HOSPITAL_BASED_OUTPATIENT_CLINIC_OR_DEPARTMENT_OTHER): Payer: Self-pay | Admitting: *Deleted

## 2022-03-30 NOTE — Progress Notes (Signed)
ED Antimicrobial Stewardship Positive Culture Follow Up   [name] [name] is an 88 y.o. male who presented to [facility] on 03/27/2022 with a chief complaint of  Chief Complaint  Patient presents with   Near Syncope    Recent Results (from the past 720 hour(s))  Blood Culture (routine x 2)     Status: None   Collection Time: 03/20/22  4:27 AM   Specimen: BLOOD  Result Value Ref Range Status   Specimen Description   Final    BLOOD LEFT ANTECUBITAL Performed at [facility], [address] [place], [place] [address]    Special Requests   Final    BOTTLES DRAWN AEROBIC AND ANAEROBIC Blood Culture adequate volume Performed at [facility], [address] [place], [place] [address]    Culture   Final    NO GROWTH 6 DAYS Performed at [facility], [place] [address] [place], [place] [address]    Report Status 03/26/2022 FINAL  Final  Blood Culture (routine x 2)     Status: None   Collection Time: 03/20/22  4:27 AM   Specimen: BLOOD  Result Value Ref Range Status   Specimen Description   Final    BLOOD BLOOD LEFT FOREARM Performed at [facility], [address] [place], [place] [address]    Special Requests   Final    BOTTLES DRAWN AEROBIC AND ANAEROBIC Blood Culture adequate volume Performed at [facility], [address] [place], [place] [address]    Culture   Final    NO GROWTH 6 DAYS Performed at [facility], [place] [address], [place], [place] [address]    Report Status 03/26/2022 FINAL  Final  SARS Coronavirus 2 by RT PCR (hospital order, performed in [facility] hospital lab) *cepheid single result test* Anterior Nasal Swab     Status: None   Collection Time: 03/20/22  4:35 AM   Specimen: Anterior Nasal Swab  Result Value Ref Range Status   SARS Coronavirus 2 by RT PCR NEGATIVE NEGATIVE Final    Comment: (NOTE) SARS-CoV-2 target nucleic acids are NOT DETECTED.  The  SARS-CoV-2 RNA is generally detectable in upper and lower respiratory specimens during the acute phase of infection. The lowest concentration of SARS-CoV-2 viral copies this assay can detect is 250 copies / mL. A negative result does not preclude SARS-CoV-2 infection and should not be used as the sole basis for treatment or other patient management decisions.  A negative result may occur with improper specimen collection / handling, submission of specimen other than nasopharyngeal swab, presence of viral mutation(s) within the areas targeted by this assay, and inadequate number of viral copies (<250 copies / mL). A negative result must be combined with clinical observations, patient history, and epidemiological information.  Fact Sheet for Patients:   https://www.patel.info/  Fact Sheet for Healthcare Providers: https://hall.com/  This test is not yet approved or  cleared by the [place] FDA and has been authorized for detection and/or diagnosis of SARS-CoV-2 by FDA under an Emergency Use Authorization (EUA).  This EUA will remain in effect (meaning this test can be used) for the duration of the COVID-19 declaration under Section 564(b)(1) of the Act, 21 U.S.C. section 360bbb-3(b)(1), unless the authorization is terminated or revoked sooner.  Performed at [facility], [address] [place], [place] [address]   Urine Culture     Status: None   Collection Time: 03/20/22  5:43 AM  Specimen: In/Out Cath Urine  Result Value Ref Range Status   Specimen Description   Final    IN/OUT CATH URINE Performed at [facility], [address] [place], [place] [address]    Special Requests   Final    NONE Performed at [facility], [address] [place], [place] [address]    Culture   Final    NO GROWTH Performed at [facility], [place] [address] [place], [place]  [address]    Report Status 03/21/2022 FINAL  Final  SARS Coronavirus 2 by RT PCR (hospital order, performed in [facility] hospital lab) *cepheid single result test* Anterior Nasal Swab     Status: Abnormal   Collection Time: 03/22/22 10:19 PM   Specimen: Anterior Nasal Swab  Result Value Ref Range Status   SARS Coronavirus 2 by RT PCR POSITIVE (A) NEGATIVE Final    Comment: (NOTE) SARS-CoV-2 target nucleic acids are DETECTED  SARS-CoV-2 RNA is generally detectable in upper respiratory specimens  during the acute phase of infection.  Positive results are indicative  of the presence of the identified virus, but do not rule out bacterial infection or co-infection with other pathogens not detected by the test.  Clinical correlation with patient history and  other diagnostic information is necessary to determine patient infection status.  The expected result is negative.  Fact Sheet for Patients:   https://www.patel.info/   Fact Sheet for Healthcare Providers:   https://hall.com/    This test is not yet approved or cleared by the [place] FDA and  has been authorized for detection and/or diagnosis of SARS-CoV-2 by FDA under an Emergency Use Authorization (EUA).  This EUA will remain in effect (meaning this test can be used) for the duration of  the COVID-19 declaration under Section 564(b)(1)  of the Act, 21 U.S.C. section 360-bbb-3(b)(1), unless the authorization is terminated or revoked sooner.   Performed at [facility], [place] [address], [place], [place] [address]   Urine Culture     Status: Abnormal   Collection Time: 03/27/22 12:16 PM   Specimen: Urine, Clean Catch  Result Value Ref Range Status   Specimen Description   Final    URINE, CLEAN CATCH Performed at [facility], [place] [address] [place], [place] [address]    Special Requests   Final    NONE Performed at [facility], [place] [address], [place], [place] [address]    Culture 60,000 COLONIES/mL ENTEROCOCCUS FAECALIS (A)  Final   Report Status 03/29/2022 FINAL  Final   Organism ID, Bacteria ENTEROCOCCUS FAECALIS (A)  Final      Susceptibility   Enterococcus faecalis - MIC*    AMPICILLIN <=2 SENSITIVE Sensitive     NITROFURANTOIN <=16 SENSITIVE Sensitive     VANCOMYCIN 1 SENSITIVE Sensitive     * 60,000 COLONIES/mL ENTEROCOCCUS FAECALIS    '[x]'$  Treated with cefdinir, organism resistant to prescribed antimicrobial '[]'$  Patient discharged originally without antimicrobial agent and treatment is now indicated  New antibiotic prescription: amoxicillin '500mg'$  PO x 5 days  ED Provider: Astrid Drafts, PA-C   Peggyann Juba, PharmD, BCPS 03/30/2022, 9:04 AM Clinical Pharmacist [phone]

## 2022-03-30 NOTE — Telephone Encounter (Signed)
Post ED Visit - Positive Culture Follow-up: Successful Patient Follow-Up  Culture assessed and recommendations reviewed by:  '[x]'$  Alyson Ingles, Pharm.D. '[]'$  Heide Guile, Pharm.D., BCPS AQ-ID '[]'$  Parks Neptune, Pharm.D., BCPS '[]'$  Alycia Rossetti, Pharm.D., BCPS '[]'$  [place], Pharm.D., BCPS, AAHIVP '[]'$  Legrand Como, Pharm.D., BCPS, AAHIVP '[]'$  Salome Arnt, PharmD, BCPS '[]'$  Johnnette Gourd, PharmD, BCPS '[]'$  Hughes Better, PharmD, BCPS '[]'$  Leeroy Cha, PharmD  Positive urine culture  '[]'$  Patient discharged without antimicrobial prescription and treatment is now indicated '[x]'$  Organism is resistant to prescribed ED discharge antimicrobial '[]'$  Patient with positive blood cultures  Changes discussed with ED provider: [name], PA New antibiotic prescription Amoxicillin '500mg'$  PO q12hrs x 5 days [facility] to [facility] [place] [place]   Contacted patient, date 03/30/22, time 1109   [name] [name] 03/30/2022, 11:09 AM

## 2022-04-05 DIAGNOSIS — N390 Urinary tract infection, site not specified: Secondary | ICD-10-CM | POA: Diagnosis not present

## 2022-04-08 ENCOUNTER — Telehealth: Payer: Self-pay

## 2022-04-08 DIAGNOSIS — E1159 Type 2 diabetes mellitus with other circulatory complications: Secondary | ICD-10-CM

## 2022-04-08 DIAGNOSIS — Z7902 Long term (current) use of antithrombotics/antiplatelets: Secondary | ICD-10-CM | POA: Diagnosis not present

## 2022-04-08 DIAGNOSIS — I10 Essential (primary) hypertension: Secondary | ICD-10-CM

## 2022-04-08 DIAGNOSIS — F03B0 Unspecified dementia, moderate, without behavioral disturbance, psychotic disturbance, mood disturbance, and anxiety: Secondary | ICD-10-CM | POA: Diagnosis not present

## 2022-04-08 DIAGNOSIS — G319 Degenerative disease of nervous system, unspecified: Secondary | ICD-10-CM | POA: Diagnosis not present

## 2022-04-08 DIAGNOSIS — R202 Paresthesia of skin: Secondary | ICD-10-CM | POA: Diagnosis not present

## 2022-04-08 DIAGNOSIS — Z79899 Other long term (current) drug therapy: Secondary | ICD-10-CM | POA: Diagnosis not present

## 2022-04-08 DIAGNOSIS — R251 Tremor, unspecified: Secondary | ICD-10-CM | POA: Diagnosis not present

## 2022-04-08 NOTE — Patient Outreach (Signed)
Referral from [employer] [employer]: Patients' wife states patient truly needs someone to help assist with educational needs regarding diabetes. Referral for [facility] meals made today to assist with post ED needs as patient recently had COVID. Please advise if able to assist as daughter is POA for both parents and could benefit from all the help she can get.   Care Guide Larena Sox is trying to get in contact with her for resources.

## 2022-04-10 ENCOUNTER — Encounter: Payer: Self-pay | Admitting: *Deleted

## 2022-04-10 ENCOUNTER — Ambulatory Visit: Payer: Self-pay | Admitting: *Deleted

## 2022-04-10 ENCOUNTER — Telehealth: Payer: Self-pay

## 2022-04-10 DIAGNOSIS — E119 Type 2 diabetes mellitus without complications: Secondary | ICD-10-CM

## 2022-04-10 NOTE — Chronic Care Management (AMB) (Signed)
  Care Coordination   Note   04/10/2022 Name: CHARLTON BOULE MRN: [id number] DOB: 07-30-1932  Sula Soda Gorniak is a 88 y.o. year old male who sees [name], MD for primary care. I reached out to [employer] by phone today to offer care coordination services.  Mr. Navarra was given information about Care Coordination services today including:   The Care Coordination services include support from the care team which includes your Nurse Coordinator, Clinical Social Worker, or Pharmacist.  The Care Coordination team is here to help remove barriers to the health concerns and goals most important to you. Care Coordination services are voluntary, and the patient may decline or stop services at any time by request to their care team member.   Care Coordination Consent Status: Patient agreed to services and verbal consent obtained.   Follow up plan:  Telephone appointment with care coordination team member scheduled for:  04/10/2022  Encounter Outcome:  Pt. Scheduled Noreene Larsson, [place],  [address] Direct Dial: [phone] [email or web address]

## 2022-04-10 NOTE — Patient Outreach (Signed)
  Care Coordination   Initial Visit Note   04/10/2022 Name: [name] [name] MRN: [id number] DOB: 11/02/1932  [name] [name] is a 88 y.o. year old male who sees [name], MD for primary care. I spoke with  [name] [name] and spouse [name] [name] by phone today.  What matters to the patients health and wellness today?  Transportation    Goals Addressed               This Visit's Progress     COMPLETED: Transportation (pt-stated)        Care Coordination Interventions: Reviewed medications with patient and discussed adherence with all medications with no refills Reviewed scheduled/upcoming provider appointments including pending appointments and encouraged pt to scheduled his AWV with his primary provider Care Guide referral for Transportation resources Screening for signs and symptoms of depression related to chronic disease state  Assessed social determinant of health barriers  RN assessment for any needs as pt reports his CBG are improving around 200 however was >250 at one time. A1c 03/23/2022 8.7 however reflects recent illness. Offered case management services for ongoing management of care however pt opt to decline at this time indicating his daughter is a Designer, jewellery and can assist with his ongoing management of care. Reports his providers continue to adjust his medications related to his ongoing covid symptoms as he remains weak. Verified pt uses his assisted devices and at all times has supervision with any task. No other issues to address at this time.         SDOH assessments and interventions completed:  Yes  SDOH Interventions Today    Flowsheet Row Most Recent Value  SDOH Interventions   Food Insecurity Interventions Intervention Not Indicated  Housing Interventions Intervention Not Indicated  Transportation Interventions Intervention Not Indicated  Utilities Interventions Intervention Not Indicated        Care Coordination  Interventions Activated:  Yes  Care Coordination Interventions:  Yes, provided   Follow up plan: No further intervention required.   Encounter Outcome:  Pt. Visit Completed   Raina Mina, RN Care Management Coordinator [place] Office [phone]

## 2022-04-10 NOTE — Patient Instructions (Signed)
Visit Information  Thank you for taking time to visit with me today. Please don't hesitate to contact me if I can be of assistance to you.   Following are the goals we discussed today:   Goals Addressed               This Visit's Progress     COMPLETED: Transportation (pt-stated)        Care Coordination Interventions: Reviewed medications with patient and discussed adherence with all medications with no refills Reviewed scheduled/upcoming provider appointments including pending appointments and encouraged pt to scheduled his AWV with his primary provider Care Guide referral for Transportation resources Screening for signs and symptoms of depression related to chronic disease state  Assessed social determinant of health barriers  RN assessment for any needs as pt reports his CBG are improving around 200 however was >250 at one time. A1c 03/23/2022 8.7 however reflects recent illness. Offered case management services for ongoing management of care however pt opt to decline at this time indicating his daughter is a Designer, jewellery and can assist with his ongoing management of care. Reports his providers continue to adjust his medications related to his ongoing covid symptoms as he remains weak. Verified pt uses his assisted devices and at all times has supervision with any task. No other issues to address at this time.           Please call the care guide team at [phone] if you need to cancel or reschedule your appointment.   If you are experiencing a Mental Health or [place] or need someone to talk to, please call the Suicide and Crisis Lifeline: 988  Patient verbalizes understanding of instructions and care plan provided today and agrees to view in [place]. Active MyChart status and patient understanding of how to access instructions and care plan via MyChart confirmed with patient.     No further follow up required: No additional needs  Raina Mina,  RN Care Management Coordinator [place] Office [phone]

## 2022-04-11 ENCOUNTER — Telehealth: Payer: Self-pay

## 2022-04-11 NOTE — Telephone Encounter (Signed)
   Telephone encounter was:  Successful.  04/11/2022 Name: [name] [name] MRN: [id number] DOB: 08-Aug-1932  [name] [name] is a 88 y.o. year old male who is a primary care patient of [name], MD . The community resource team was consulted for assistance with Transportation Needs   Care guide performed the following interventions: Spoke with patient's wife [name] [name] about [employer].  [name] [name] stated that they are not interested in [employer] at this time.  I offered to mail the information on [employer] if they decided to apply later, but she was not interested.  Follow Up Plan:  No further follow up planned at this time. The patient has been provided with needed resources.  [place] Resource Care Guide   ??[email or web address]  ?? [phone]   Website: [email or web address]  [email or web address]

## 2022-04-30 DIAGNOSIS — E039 Hypothyroidism, unspecified: Secondary | ICD-10-CM | POA: Diagnosis not present

## 2022-05-22 DIAGNOSIS — H52201 Unspecified astigmatism, right eye: Secondary | ICD-10-CM | POA: Diagnosis not present

## 2022-05-22 DIAGNOSIS — Z961 Presence of intraocular lens: Secondary | ICD-10-CM | POA: Diagnosis not present

## 2022-05-22 DIAGNOSIS — E113291 Type 2 diabetes mellitus with mild nonproliferative diabetic retinopathy without macular edema, right eye: Secondary | ICD-10-CM | POA: Diagnosis not present

## 2022-05-22 DIAGNOSIS — H524 Presbyopia: Secondary | ICD-10-CM | POA: Diagnosis not present

## 2022-05-22 DIAGNOSIS — H1789 Other corneal scars and opacities: Secondary | ICD-10-CM | POA: Diagnosis not present

## 2022-05-22 DIAGNOSIS — H47323 Drusen of optic disc, bilateral: Secondary | ICD-10-CM | POA: Diagnosis not present

## 2022-05-22 DIAGNOSIS — H43813 Vitreous degeneration, bilateral: Secondary | ICD-10-CM | POA: Diagnosis not present

## 2022-05-28 DIAGNOSIS — B351 Tinea unguium: Secondary | ICD-10-CM | POA: Diagnosis not present

## 2022-05-28 DIAGNOSIS — M79676 Pain in unspecified toe(s): Secondary | ICD-10-CM | POA: Diagnosis not present

## 2022-05-28 DIAGNOSIS — L84 Corns and callosities: Secondary | ICD-10-CM | POA: Diagnosis not present

## 2022-05-28 DIAGNOSIS — E1142 Type 2 diabetes mellitus with diabetic polyneuropathy: Secondary | ICD-10-CM | POA: Diagnosis not present

## 2022-05-31 DIAGNOSIS — E1122 Type 2 diabetes mellitus with diabetic chronic kidney disease: Secondary | ICD-10-CM | POA: Diagnosis not present

## 2022-05-31 DIAGNOSIS — E039 Hypothyroidism, unspecified: Secondary | ICD-10-CM | POA: Diagnosis not present

## 2022-05-31 DIAGNOSIS — N1832 Chronic kidney disease, stage 3b: Secondary | ICD-10-CM | POA: Diagnosis not present

## 2022-05-31 DIAGNOSIS — G4762 Sleep related leg cramps: Secondary | ICD-10-CM | POA: Diagnosis not present

## 2022-06-21 DIAGNOSIS — I129 Hypertensive chronic kidney disease with stage 1 through stage 4 chronic kidney disease, or unspecified chronic kidney disease: Secondary | ICD-10-CM | POA: Diagnosis not present

## 2022-06-21 DIAGNOSIS — N1832 Chronic kidney disease, stage 3b: Secondary | ICD-10-CM | POA: Diagnosis not present

## 2022-06-21 DIAGNOSIS — E1122 Type 2 diabetes mellitus with diabetic chronic kidney disease: Secondary | ICD-10-CM | POA: Diagnosis not present

## 2022-06-21 DIAGNOSIS — N2581 Secondary hyperparathyroidism of renal origin: Secondary | ICD-10-CM | POA: Diagnosis not present

## 2022-06-21 DIAGNOSIS — D631 Anemia in chronic kidney disease: Secondary | ICD-10-CM | POA: Diagnosis not present

## 2022-09-06 DIAGNOSIS — E039 Hypothyroidism, unspecified: Secondary | ICD-10-CM | POA: Diagnosis not present

## 2022-09-06 DIAGNOSIS — N1832 Chronic kidney disease, stage 3b: Secondary | ICD-10-CM | POA: Diagnosis not present

## 2022-09-06 DIAGNOSIS — E1122 Type 2 diabetes mellitus with diabetic chronic kidney disease: Secondary | ICD-10-CM | POA: Diagnosis not present

## 2022-09-06 DIAGNOSIS — G4762 Sleep related leg cramps: Secondary | ICD-10-CM | POA: Diagnosis not present

## 2022-09-17 DIAGNOSIS — N184 Chronic kidney disease, stage 4 (severe): Secondary | ICD-10-CM | POA: Diagnosis not present

## 2022-09-17 DIAGNOSIS — E039 Hypothyroidism, unspecified: Secondary | ICD-10-CM | POA: Diagnosis not present

## 2022-09-17 DIAGNOSIS — E1122 Type 2 diabetes mellitus with diabetic chronic kidney disease: Secondary | ICD-10-CM | POA: Diagnosis not present

## 2022-09-17 DIAGNOSIS — F330 Major depressive disorder, recurrent, mild: Secondary | ICD-10-CM | POA: Diagnosis not present

## 2022-09-17 DIAGNOSIS — F0393 Unspecified dementia, unspecified severity, with mood disturbance: Secondary | ICD-10-CM | POA: Diagnosis not present

## 2022-09-17 DIAGNOSIS — G20B2 Parkinson's disease with dyskinesia, with fluctuations: Secondary | ICD-10-CM | POA: Diagnosis not present

## 2022-10-11 DIAGNOSIS — D631 Anemia in chronic kidney disease: Secondary | ICD-10-CM | POA: Diagnosis not present

## 2022-10-11 DIAGNOSIS — N281 Cyst of kidney, acquired: Secondary | ICD-10-CM | POA: Diagnosis not present

## 2022-10-11 DIAGNOSIS — E1122 Type 2 diabetes mellitus with diabetic chronic kidney disease: Secondary | ICD-10-CM | POA: Diagnosis not present

## 2022-10-11 DIAGNOSIS — N2581 Secondary hyperparathyroidism of renal origin: Secondary | ICD-10-CM | POA: Diagnosis not present

## 2022-10-11 DIAGNOSIS — I129 Hypertensive chronic kidney disease with stage 1 through stage 4 chronic kidney disease, or unspecified chronic kidney disease: Secondary | ICD-10-CM | POA: Diagnosis not present

## 2022-10-11 DIAGNOSIS — N1832 Chronic kidney disease, stage 3b: Secondary | ICD-10-CM | POA: Diagnosis not present

## 2022-10-14 DIAGNOSIS — F03B0 Unspecified dementia, moderate, without behavioral disturbance, psychotic disturbance, mood disturbance, and anxiety: Secondary | ICD-10-CM | POA: Diagnosis not present

## 2022-10-14 DIAGNOSIS — R202 Paresthesia of skin: Secondary | ICD-10-CM | POA: Diagnosis not present

## 2022-10-14 DIAGNOSIS — Z79899 Other long term (current) drug therapy: Secondary | ICD-10-CM | POA: Diagnosis not present

## 2022-10-22 DIAGNOSIS — G5601 Carpal tunnel syndrome, right upper limb: Secondary | ICD-10-CM | POA: Diagnosis not present

## 2022-10-29 DIAGNOSIS — N1832 Chronic kidney disease, stage 3b: Secondary | ICD-10-CM | POA: Diagnosis not present

## 2022-11-08 DIAGNOSIS — G5601 Carpal tunnel syndrome, right upper limb: Secondary | ICD-10-CM | POA: Diagnosis not present

## 2022-11-12 DIAGNOSIS — L84 Corns and callosities: Secondary | ICD-10-CM | POA: Diagnosis not present

## 2022-11-12 DIAGNOSIS — M79676 Pain in unspecified toe(s): Secondary | ICD-10-CM | POA: Diagnosis not present

## 2022-11-12 DIAGNOSIS — B351 Tinea unguium: Secondary | ICD-10-CM | POA: Diagnosis not present

## 2022-12-20 DIAGNOSIS — Z85828 Personal history of other malignant neoplasm of skin: Secondary | ICD-10-CM | POA: Diagnosis not present

## 2022-12-20 DIAGNOSIS — D1801 Hemangioma of skin and subcutaneous tissue: Secondary | ICD-10-CM | POA: Diagnosis not present

## 2022-12-20 DIAGNOSIS — L578 Other skin changes due to chronic exposure to nonionizing radiation: Secondary | ICD-10-CM | POA: Diagnosis not present

## 2022-12-20 DIAGNOSIS — D229 Melanocytic nevi, unspecified: Secondary | ICD-10-CM | POA: Diagnosis not present

## 2022-12-20 DIAGNOSIS — L570 Actinic keratosis: Secondary | ICD-10-CM | POA: Diagnosis not present

## 2022-12-20 DIAGNOSIS — L821 Other seborrheic keratosis: Secondary | ICD-10-CM | POA: Diagnosis not present

## 2022-12-20 DIAGNOSIS — L814 Other melanin hyperpigmentation: Secondary | ICD-10-CM | POA: Diagnosis not present

## 2023-01-15 DIAGNOSIS — F339 Major depressive disorder, recurrent, unspecified: Secondary | ICD-10-CM | POA: Diagnosis not present

## 2023-01-15 DIAGNOSIS — F4321 Adjustment disorder with depressed mood: Secondary | ICD-10-CM | POA: Diagnosis not present

## 2023-01-15 DIAGNOSIS — Z9989 Dependence on other enabling machines and devices: Secondary | ICD-10-CM | POA: Diagnosis not present

## 2023-01-15 DIAGNOSIS — E1122 Type 2 diabetes mellitus with diabetic chronic kidney disease: Secondary | ICD-10-CM | POA: Diagnosis not present

## 2023-01-15 DIAGNOSIS — N183 Chronic kidney disease, stage 3 unspecified: Secondary | ICD-10-CM | POA: Diagnosis not present

## 2023-01-15 DIAGNOSIS — N2581 Secondary hyperparathyroidism of renal origin: Secondary | ICD-10-CM | POA: Diagnosis not present

## 2023-01-15 DIAGNOSIS — H6121 Impacted cerumen, right ear: Secondary | ICD-10-CM | POA: Diagnosis not present

## 2023-01-15 DIAGNOSIS — E1151 Type 2 diabetes mellitus with diabetic peripheral angiopathy without gangrene: Secondary | ICD-10-CM | POA: Diagnosis not present

## 2023-01-17 DIAGNOSIS — L570 Actinic keratosis: Secondary | ICD-10-CM | POA: Diagnosis not present

## 2023-01-21 DIAGNOSIS — L603 Nail dystrophy: Secondary | ICD-10-CM | POA: Diagnosis not present

## 2023-01-21 DIAGNOSIS — L84 Corns and callosities: Secondary | ICD-10-CM | POA: Diagnosis not present

## 2023-01-21 DIAGNOSIS — M79676 Pain in unspecified toe(s): Secondary | ICD-10-CM | POA: Diagnosis not present

## 2023-01-21 DIAGNOSIS — E1142 Type 2 diabetes mellitus with diabetic polyneuropathy: Secondary | ICD-10-CM | POA: Diagnosis not present

## 2023-03-14 DIAGNOSIS — E1122 Type 2 diabetes mellitus with diabetic chronic kidney disease: Secondary | ICD-10-CM | POA: Diagnosis not present

## 2023-03-14 DIAGNOSIS — N1832 Chronic kidney disease, stage 3b: Secondary | ICD-10-CM | POA: Diagnosis not present

## 2023-03-14 DIAGNOSIS — E039 Hypothyroidism, unspecified: Secondary | ICD-10-CM | POA: Diagnosis not present

## 2023-03-21 DIAGNOSIS — L570 Actinic keratosis: Secondary | ICD-10-CM | POA: Diagnosis not present

## 2023-04-01 DIAGNOSIS — E1142 Type 2 diabetes mellitus with diabetic polyneuropathy: Secondary | ICD-10-CM | POA: Diagnosis not present

## 2023-04-01 DIAGNOSIS — L84 Corns and callosities: Secondary | ICD-10-CM | POA: Diagnosis not present

## 2023-04-01 DIAGNOSIS — M79676 Pain in unspecified toe(s): Secondary | ICD-10-CM | POA: Diagnosis not present

## 2023-04-04 DIAGNOSIS — Z79899 Other long term (current) drug therapy: Secondary | ICD-10-CM | POA: Diagnosis not present

## 2023-04-04 DIAGNOSIS — E039 Hypothyroidism, unspecified: Secondary | ICD-10-CM | POA: Diagnosis not present

## 2023-04-04 DIAGNOSIS — G629 Polyneuropathy, unspecified: Secondary | ICD-10-CM | POA: Diagnosis not present

## 2023-04-04 DIAGNOSIS — Z0000 Encounter for general adult medical examination without abnormal findings: Secondary | ICD-10-CM | POA: Diagnosis not present

## 2023-04-04 DIAGNOSIS — E162 Hypoglycemia, unspecified: Secondary | ICD-10-CM | POA: Diagnosis not present

## 2023-04-04 DIAGNOSIS — Z9181 History of falling: Secondary | ICD-10-CM | POA: Diagnosis not present

## 2023-05-12 DIAGNOSIS — R413 Other amnesia: Secondary | ICD-10-CM | POA: Diagnosis not present

## 2023-05-12 DIAGNOSIS — I7771 Dissection of carotid artery: Secondary | ICD-10-CM | POA: Diagnosis not present

## 2023-05-12 DIAGNOSIS — Z8673 Personal history of transient ischemic attack (TIA), and cerebral infarction without residual deficits: Secondary | ICD-10-CM | POA: Diagnosis not present

## 2023-06-03 DIAGNOSIS — H47323 Drusen of optic disc, bilateral: Secondary | ICD-10-CM | POA: Diagnosis not present

## 2023-06-03 DIAGNOSIS — H5202 Hypermetropia, left eye: Secondary | ICD-10-CM | POA: Diagnosis not present

## 2023-06-03 DIAGNOSIS — Z961 Presence of intraocular lens: Secondary | ICD-10-CM | POA: Diagnosis not present

## 2023-06-03 DIAGNOSIS — E119 Type 2 diabetes mellitus without complications: Secondary | ICD-10-CM | POA: Diagnosis not present

## 2023-06-10 DIAGNOSIS — L84 Corns and callosities: Secondary | ICD-10-CM | POA: Diagnosis not present

## 2023-06-10 DIAGNOSIS — L603 Nail dystrophy: Secondary | ICD-10-CM | POA: Diagnosis not present

## 2023-06-10 DIAGNOSIS — E1142 Type 2 diabetes mellitus with diabetic polyneuropathy: Secondary | ICD-10-CM | POA: Diagnosis not present

## 2023-06-10 DIAGNOSIS — M79676 Pain in unspecified toe(s): Secondary | ICD-10-CM | POA: Diagnosis not present

## 2023-06-20 DIAGNOSIS — L814 Other melanin hyperpigmentation: Secondary | ICD-10-CM | POA: Diagnosis not present

## 2023-06-20 DIAGNOSIS — Z85828 Personal history of other malignant neoplasm of skin: Secondary | ICD-10-CM | POA: Diagnosis not present

## 2023-06-20 DIAGNOSIS — D229 Melanocytic nevi, unspecified: Secondary | ICD-10-CM | POA: Diagnosis not present

## 2023-06-20 DIAGNOSIS — D1801 Hemangioma of skin and subcutaneous tissue: Secondary | ICD-10-CM | POA: Diagnosis not present

## 2023-06-20 DIAGNOSIS — L570 Actinic keratosis: Secondary | ICD-10-CM | POA: Diagnosis not present

## 2023-06-20 DIAGNOSIS — L821 Other seborrheic keratosis: Secondary | ICD-10-CM | POA: Diagnosis not present

## 2023-06-20 DIAGNOSIS — L578 Other skin changes due to chronic exposure to nonionizing radiation: Secondary | ICD-10-CM | POA: Diagnosis not present

## 2023-07-11 DIAGNOSIS — N1832 Chronic kidney disease, stage 3b: Secondary | ICD-10-CM | POA: Diagnosis not present

## 2023-09-12 DIAGNOSIS — Z9181 History of falling: Secondary | ICD-10-CM | POA: Diagnosis not present

## 2023-09-12 DIAGNOSIS — N1832 Chronic kidney disease, stage 3b: Secondary | ICD-10-CM | POA: Diagnosis not present

## 2023-09-12 DIAGNOSIS — R2689 Other abnormalities of gait and mobility: Secondary | ICD-10-CM | POA: Diagnosis not present

## 2023-09-12 DIAGNOSIS — D509 Iron deficiency anemia, unspecified: Secondary | ICD-10-CM | POA: Diagnosis not present

## 2023-09-12 DIAGNOSIS — E1122 Type 2 diabetes mellitus with diabetic chronic kidney disease: Secondary | ICD-10-CM | POA: Diagnosis not present

## 2023-09-12 DIAGNOSIS — E039 Hypothyroidism, unspecified: Secondary | ICD-10-CM | POA: Diagnosis not present

## 2023-09-16 DIAGNOSIS — B351 Tinea unguium: Secondary | ICD-10-CM | POA: Diagnosis not present

## 2023-09-16 DIAGNOSIS — L84 Corns and callosities: Secondary | ICD-10-CM | POA: Diagnosis not present

## 2023-09-16 DIAGNOSIS — M79676 Pain in unspecified toe(s): Secondary | ICD-10-CM | POA: Diagnosis not present

## 2023-09-16 DIAGNOSIS — E1142 Type 2 diabetes mellitus with diabetic polyneuropathy: Secondary | ICD-10-CM | POA: Diagnosis not present

## 2023-09-22 DIAGNOSIS — Z9181 History of falling: Secondary | ICD-10-CM | POA: Diagnosis not present

## 2023-09-22 DIAGNOSIS — Z8673 Personal history of transient ischemic attack (TIA), and cerebral infarction without residual deficits: Secondary | ICD-10-CM | POA: Diagnosis not present

## 2023-09-22 DIAGNOSIS — R2689 Other abnormalities of gait and mobility: Secondary | ICD-10-CM | POA: Diagnosis not present

## 2023-09-22 DIAGNOSIS — M6281 Muscle weakness (generalized): Secondary | ICD-10-CM | POA: Diagnosis not present

## 2023-09-29 DIAGNOSIS — Z9181 History of falling: Secondary | ICD-10-CM | POA: Diagnosis not present

## 2023-09-29 DIAGNOSIS — M6281 Muscle weakness (generalized): Secondary | ICD-10-CM | POA: Diagnosis not present

## 2023-09-29 DIAGNOSIS — R2689 Other abnormalities of gait and mobility: Secondary | ICD-10-CM | POA: Diagnosis not present

## 2023-09-29 DIAGNOSIS — Z8673 Personal history of transient ischemic attack (TIA), and cerebral infarction without residual deficits: Secondary | ICD-10-CM | POA: Diagnosis not present

## 2023-10-06 DIAGNOSIS — R2689 Other abnormalities of gait and mobility: Secondary | ICD-10-CM | POA: Diagnosis not present

## 2023-10-06 DIAGNOSIS — M6281 Muscle weakness (generalized): Secondary | ICD-10-CM | POA: Diagnosis not present

## 2023-10-06 DIAGNOSIS — Z8673 Personal history of transient ischemic attack (TIA), and cerebral infarction without residual deficits: Secondary | ICD-10-CM | POA: Diagnosis not present

## 2023-10-06 DIAGNOSIS — Z9181 History of falling: Secondary | ICD-10-CM | POA: Diagnosis not present

## 2023-10-14 DIAGNOSIS — R2689 Other abnormalities of gait and mobility: Secondary | ICD-10-CM | POA: Diagnosis not present

## 2023-10-14 DIAGNOSIS — Z9181 History of falling: Secondary | ICD-10-CM | POA: Diagnosis not present

## 2023-10-14 DIAGNOSIS — Z8673 Personal history of transient ischemic attack (TIA), and cerebral infarction without residual deficits: Secondary | ICD-10-CM | POA: Diagnosis not present

## 2023-10-14 DIAGNOSIS — M6281 Muscle weakness (generalized): Secondary | ICD-10-CM | POA: Diagnosis not present

## 2023-10-20 DIAGNOSIS — Z9181 History of falling: Secondary | ICD-10-CM | POA: Diagnosis not present

## 2023-10-20 DIAGNOSIS — Z8673 Personal history of transient ischemic attack (TIA), and cerebral infarction without residual deficits: Secondary | ICD-10-CM | POA: Diagnosis not present

## 2023-10-20 DIAGNOSIS — R2689 Other abnormalities of gait and mobility: Secondary | ICD-10-CM | POA: Diagnosis not present

## 2023-10-20 DIAGNOSIS — M6281 Muscle weakness (generalized): Secondary | ICD-10-CM | POA: Diagnosis not present

## 2023-10-27 DIAGNOSIS — Z8673 Personal history of transient ischemic attack (TIA), and cerebral infarction without residual deficits: Secondary | ICD-10-CM | POA: Diagnosis not present

## 2023-10-27 DIAGNOSIS — M6281 Muscle weakness (generalized): Secondary | ICD-10-CM | POA: Diagnosis not present

## 2023-10-27 DIAGNOSIS — Z9181 History of falling: Secondary | ICD-10-CM | POA: Diagnosis not present

## 2023-10-27 DIAGNOSIS — R2689 Other abnormalities of gait and mobility: Secondary | ICD-10-CM | POA: Diagnosis not present

## 2023-11-12 DIAGNOSIS — Z8673 Personal history of transient ischemic attack (TIA), and cerebral infarction without residual deficits: Secondary | ICD-10-CM | POA: Diagnosis not present

## 2023-11-12 DIAGNOSIS — Z9181 History of falling: Secondary | ICD-10-CM | POA: Diagnosis not present

## 2023-11-12 DIAGNOSIS — M6281 Muscle weakness (generalized): Secondary | ICD-10-CM | POA: Diagnosis not present

## 2023-11-12 DIAGNOSIS — R2689 Other abnormalities of gait and mobility: Secondary | ICD-10-CM | POA: Diagnosis not present

## 2023-12-12 DIAGNOSIS — E1122 Type 2 diabetes mellitus with diabetic chronic kidney disease: Secondary | ICD-10-CM | POA: Diagnosis not present

## 2023-12-12 DIAGNOSIS — E039 Hypothyroidism, unspecified: Secondary | ICD-10-CM | POA: Diagnosis not present

## 2023-12-12 DIAGNOSIS — N1832 Chronic kidney disease, stage 3b: Secondary | ICD-10-CM | POA: Diagnosis not present

## 2023-12-29 ENCOUNTER — Other Ambulatory Visit: Payer: Self-pay

## 2023-12-29 DIAGNOSIS — Z888 Allergy status to other drugs, medicaments and biological substances status: Secondary | ICD-10-CM

## 2023-12-29 DIAGNOSIS — I7143 Infrarenal abdominal aortic aneurysm, without rupture: Secondary | ICD-10-CM

## 2023-12-29 MED ORDER — PREDNISONE 50 MG PO TABS: ORAL_TABLET | ORAL | 0 refills | Status: AC

## 2023-12-29 MED ORDER — DIPHENHYDRAMINE HCL 50 MG PO CAPS: ORAL_CAPSULE | ORAL | 0 refills | Status: AC

## 2024-01-12 DIAGNOSIS — R413 Other amnesia: Secondary | ICD-10-CM | POA: Diagnosis not present

## 2024-01-12 DIAGNOSIS — G252 Other specified forms of tremor: Secondary | ICD-10-CM | POA: Diagnosis not present

## 2024-01-16 DIAGNOSIS — N2581 Secondary hyperparathyroidism of renal origin: Secondary | ICD-10-CM | POA: Diagnosis not present

## 2024-01-16 DIAGNOSIS — I129 Hypertensive chronic kidney disease with stage 1 through stage 4 chronic kidney disease, or unspecified chronic kidney disease: Secondary | ICD-10-CM | POA: Diagnosis not present

## 2024-01-16 DIAGNOSIS — D631 Anemia in chronic kidney disease: Secondary | ICD-10-CM | POA: Diagnosis not present

## 2024-01-16 DIAGNOSIS — E1122 Type 2 diabetes mellitus with diabetic chronic kidney disease: Secondary | ICD-10-CM | POA: Diagnosis not present

## 2024-01-16 DIAGNOSIS — N1832 Chronic kidney disease, stage 3b: Secondary | ICD-10-CM | POA: Diagnosis not present

## 2024-01-23 ENCOUNTER — Other Ambulatory Visit (HOSPITAL_BASED_OUTPATIENT_CLINIC_OR_DEPARTMENT_OTHER)

## 2024-01-30 ENCOUNTER — Ambulatory Visit: Admitting: Vascular Surgery

## 2024-02-09 DIAGNOSIS — N1832 Chronic kidney disease, stage 3b: Secondary | ICD-10-CM | POA: Diagnosis not present

## 2024-02-11 DIAGNOSIS — L578 Other skin changes due to chronic exposure to nonionizing radiation: Secondary | ICD-10-CM | POA: Diagnosis not present

## 2024-02-11 DIAGNOSIS — D1801 Hemangioma of skin and subcutaneous tissue: Secondary | ICD-10-CM | POA: Diagnosis not present

## 2024-02-11 DIAGNOSIS — L570 Actinic keratosis: Secondary | ICD-10-CM | POA: Diagnosis not present

## 2024-02-11 DIAGNOSIS — L814 Other melanin hyperpigmentation: Secondary | ICD-10-CM | POA: Diagnosis not present

## 2024-02-11 DIAGNOSIS — Z85828 Personal history of other malignant neoplasm of skin: Secondary | ICD-10-CM | POA: Diagnosis not present

## 2024-02-11 DIAGNOSIS — L0100 Impetigo, unspecified: Secondary | ICD-10-CM | POA: Diagnosis not present

## 2024-02-11 DIAGNOSIS — D485 Neoplasm of uncertain behavior of skin: Secondary | ICD-10-CM | POA: Diagnosis not present

## 2024-02-11 DIAGNOSIS — C4441 Basal cell carcinoma of skin of scalp and neck: Secondary | ICD-10-CM | POA: Diagnosis not present

## 2024-02-11 DIAGNOSIS — D229 Melanocytic nevi, unspecified: Secondary | ICD-10-CM | POA: Diagnosis not present

## 2024-02-11 DIAGNOSIS — L821 Other seborrheic keratosis: Secondary | ICD-10-CM | POA: Diagnosis not present

## 2024-04-14 DIAGNOSIS — C4441 Basal cell carcinoma of skin of scalp and neck: Secondary | ICD-10-CM | POA: Diagnosis not present

## 2024-05-14 DIAGNOSIS — Z1331 Encounter for screening for depression: Secondary | ICD-10-CM | POA: Diagnosis not present

## 2024-05-14 DIAGNOSIS — C4441 Basal cell carcinoma of skin of scalp and neck: Secondary | ICD-10-CM | POA: Diagnosis not present

## 2024-05-14 DIAGNOSIS — N1832 Chronic kidney disease, stage 3b: Secondary | ICD-10-CM | POA: Diagnosis not present

## 2024-05-14 DIAGNOSIS — G3184 Mild cognitive impairment, so stated: Secondary | ICD-10-CM | POA: Diagnosis not present

## 2024-05-14 DIAGNOSIS — I129 Hypertensive chronic kidney disease with stage 1 through stage 4 chronic kidney disease, or unspecified chronic kidney disease: Secondary | ICD-10-CM | POA: Diagnosis not present

## 2024-05-14 DIAGNOSIS — E1122 Type 2 diabetes mellitus with diabetic chronic kidney disease: Secondary | ICD-10-CM | POA: Diagnosis not present

## 2024-05-14 DIAGNOSIS — E039 Hypothyroidism, unspecified: Secondary | ICD-10-CM | POA: Diagnosis not present

## 2024-05-14 DIAGNOSIS — E1165 Type 2 diabetes mellitus with hyperglycemia: Secondary | ICD-10-CM | POA: Diagnosis not present

## 2024-05-14 DIAGNOSIS — Z8673 Personal history of transient ischemic attack (TIA), and cerebral infarction without residual deficits: Secondary | ICD-10-CM | POA: Diagnosis not present

## 2024-05-14 DIAGNOSIS — G2581 Restless legs syndrome: Secondary | ICD-10-CM | POA: Diagnosis not present

## 2024-05-14 DIAGNOSIS — Z0000 Encounter for general adult medical examination without abnormal findings: Secondary | ICD-10-CM | POA: Diagnosis not present

## 2024-05-14 DIAGNOSIS — F339 Major depressive disorder, recurrent, unspecified: Secondary | ICD-10-CM | POA: Diagnosis not present

## 2024-05-14 DIAGNOSIS — E785 Hyperlipidemia, unspecified: Secondary | ICD-10-CM | POA: Diagnosis not present

## 2024-05-21 ENCOUNTER — Telehealth: Payer: Self-pay

## 2024-05-21 NOTE — Telephone Encounter (Signed)
 Letter sent to patient's home address in order to prompt to call our office for scheduling of CT Scan.  Patient saw Dr. Pearline on 5/30 but has not had the ordered CT Scan yet.    CT Scan sheet sent to scanning.
